# Patient Record
Sex: Female | Born: 1970 | Race: White | Hispanic: No | Marital: Married | Smoking: Current every day smoker
Health system: Southern US, Community
[De-identification: ages and names within clinical notes are randomized; demographics above are authoritative.]

## PROBLEM LIST (undated history)

## (undated) DIAGNOSIS — F419 Anxiety disorder, unspecified: Secondary | ICD-10-CM

## (undated) DIAGNOSIS — E538 Deficiency of other specified B group vitamins: Secondary | ICD-10-CM

## (undated) DIAGNOSIS — E559 Vitamin D deficiency, unspecified: Secondary | ICD-10-CM

## (undated) DIAGNOSIS — K8020 Calculus of gallbladder without cholecystitis without obstruction: Secondary | ICD-10-CM

## (undated) DIAGNOSIS — R519 Headache, unspecified: Secondary | ICD-10-CM

## (undated) DIAGNOSIS — K219 Gastro-esophageal reflux disease without esophagitis: Secondary | ICD-10-CM

## (undated) DIAGNOSIS — E7800 Pure hypercholesterolemia, unspecified: Secondary | ICD-10-CM

## (undated) DIAGNOSIS — J189 Pneumonia, unspecified organism: Secondary | ICD-10-CM

## (undated) DIAGNOSIS — R51 Headache: Secondary | ICD-10-CM

## (undated) DIAGNOSIS — R200 Anesthesia of skin: Secondary | ICD-10-CM

## (undated) HISTORY — DX: Headache: R51

## (undated) HISTORY — DX: Vitamin D deficiency, unspecified: E55.9

## (undated) HISTORY — DX: Headache, unspecified: R51.9

## (undated) HISTORY — DX: Calculus of gallbladder without cholecystitis without obstruction: K80.20

## (undated) HISTORY — DX: Deficiency of other specified B group vitamins: E53.8

## (undated) HISTORY — DX: Anesthesia of skin: R20.0

## (undated) HISTORY — DX: Anxiety disorder, unspecified: F41.9

---

## 2001-12-11 ENCOUNTER — Other Ambulatory Visit: Admission: RE | Admit: 2001-12-11 | Discharge: 2001-12-11 | Payer: Self-pay | Admitting: Family Medicine

## 2002-12-09 ENCOUNTER — Ambulatory Visit (HOSPITAL_COMMUNITY): Admission: RE | Admit: 2002-12-09 | Discharge: 2002-12-09 | Payer: Self-pay | Admitting: Family Medicine

## 2003-10-31 ENCOUNTER — Other Ambulatory Visit: Admission: RE | Admit: 2003-10-31 | Discharge: 2003-10-31 | Payer: Self-pay | Admitting: Family Medicine

## 2003-12-19 ENCOUNTER — Ambulatory Visit (HOSPITAL_COMMUNITY): Admission: RE | Admit: 2003-12-19 | Discharge: 2003-12-19 | Payer: Self-pay | Admitting: Family Medicine

## 2004-02-19 ENCOUNTER — Ambulatory Visit (HOSPITAL_COMMUNITY): Admission: RE | Admit: 2004-02-19 | Discharge: 2004-02-19 | Payer: Self-pay | Admitting: Family Medicine

## 2004-08-15 ENCOUNTER — Emergency Department (HOSPITAL_COMMUNITY): Admission: EM | Admit: 2004-08-15 | Discharge: 2004-08-15 | Payer: Self-pay | Admitting: *Deleted

## 2004-10-14 ENCOUNTER — Encounter: Admission: RE | Admit: 2004-10-14 | Discharge: 2004-10-20 | Payer: Self-pay | Admitting: Family Medicine

## 2008-11-21 ENCOUNTER — Encounter: Payer: Self-pay | Admitting: Cardiovascular Disease

## 2009-05-06 ENCOUNTER — Emergency Department (HOSPITAL_COMMUNITY): Admission: EM | Admit: 2009-05-06 | Discharge: 2009-05-06 | Payer: Self-pay | Admitting: Emergency Medicine

## 2010-04-06 LAB — BASIC METABOLIC PANEL
BUN: 12 mg/dL (ref 6–23)
CO2: 19 mEq/L (ref 19–32)
Calcium: 8.9 mg/dL (ref 8.4–10.5)
Chloride: 109 mEq/L (ref 96–112)
Creatinine, Ser: 0.79 mg/dL (ref 0.4–1.2)
GFR calc Af Amer: >60 mL/min (ref >60)
GFR calc non Af Amer: >60 mL/min (ref >60)
Glucose, Bld: 97 mg/dL (ref 70–99)
Potassium: 3.5 mEq/L (ref 3.5–5.1)
Sodium: 136 mEq/L (ref 135–145)

## 2010-04-06 LAB — DIFFERENTIAL
Basophils Absolute: 0 10*3/uL (ref 0.0–0.1)
Basophils Relative: 1 % (ref 0–1)
Eosinophils Absolute: 0.3 10*3/uL (ref 0.0–0.7)
Eosinophils Relative: 3 % (ref 0–5)
Lymphocytes Relative: 22 % (ref 12–46)
Lymphs Abs: 2.1 10*3/uL (ref 0.7–4.0)
Monocytes Absolute: 0.9 10*3/uL (ref 0.1–1.0)
Monocytes Relative: 9 % (ref 3–12)
Neutro Abs: 6.3 10*3/uL (ref 1.7–7.7)
Neutrophils Relative %: 66 % (ref 43–77)

## 2010-04-06 LAB — CBC
HCT: 41.1 % (ref 36.0–46.0)
Hemoglobin: 14.5 g/dL (ref 12.0–15.0)
MCHC: 35.2 g/dL (ref 30.0–36.0)
MCV: 87.8 fL (ref 78.0–100.0)
Platelets: 252 10*3/uL (ref 150–400)
RBC: 4.68 MIL/uL (ref 3.87–5.11)
RDW: 13.8 % (ref 11.5–15.5)
WBC: 9.6 10*3/uL (ref 4.0–10.5)

## 2010-04-06 LAB — POCT CARDIAC MARKERS
CKMB, poc: <1 ng/mL — ABNORMAL LOW (ref 1.0–8.0)
Myoglobin, poc: 52.2 ng/mL (ref 12–200)
Troponin i, poc: <0.05 ng/mL (ref 0.00–0.09)

## 2010-08-17 ENCOUNTER — Emergency Department (HOSPITAL_COMMUNITY): Payer: Self-pay

## 2010-08-17 ENCOUNTER — Encounter: Payer: Self-pay | Admitting: Emergency Medicine

## 2010-08-17 ENCOUNTER — Emergency Department (HOSPITAL_COMMUNITY)
Admission: EM | Admit: 2010-08-17 | Discharge: 2010-08-17 | Disposition: A | Discharge status: Home / Self Care | Payer: Self-pay | Attending: Emergency Medicine | Admitting: Emergency Medicine

## 2010-08-17 DIAGNOSIS — J029 Acute pharyngitis, unspecified: Secondary | ICD-10-CM | POA: Insufficient documentation

## 2010-08-17 DIAGNOSIS — R112 Nausea with vomiting, unspecified: Secondary | ICD-10-CM | POA: Insufficient documentation

## 2010-08-17 DIAGNOSIS — F17200 Nicotine dependence, unspecified, uncomplicated: Secondary | ICD-10-CM | POA: Insufficient documentation

## 2010-08-17 DIAGNOSIS — R197 Diarrhea, unspecified: Secondary | ICD-10-CM | POA: Insufficient documentation

## 2010-08-17 DIAGNOSIS — R509 Fever, unspecified: Secondary | ICD-10-CM | POA: Insufficient documentation

## 2010-08-17 LAB — COMPREHENSIVE METABOLIC PANEL
ALT: 21 U/L (ref 0–35)
AST: 19 U/L (ref 0–37)
Albumin: 3.6 g/dL (ref 3.5–5.2)
Alkaline Phosphatase: 119 U/L — ABNORMAL HIGH (ref 39–117)
BUN: 10 mg/dL (ref 6–23)
CO2: 23 mEq/L (ref 19–32)
Calcium: 9.1 mg/dL (ref 8.4–10.5)
Chloride: 104 mEq/L (ref 96–112)
Creatinine, Ser: 0.7 mg/dL (ref 0.50–1.10)
GFR calc Af Amer: >60 mL/min (ref >60)
GFR calc non Af Amer: >60 mL/min (ref >60)
Glucose, Bld: 90 mg/dL (ref 70–99)
Potassium: 4.4 mEq/L (ref 3.5–5.1)
Sodium: 138 mEq/L (ref 135–145)
Total Bilirubin: 0.4 mg/dL (ref 0.3–1.2)
Total Protein: 7.5 g/dL (ref 6.0–8.3)

## 2010-08-17 LAB — CBC
HCT: 45 % (ref 36.0–46.0)
Hemoglobin: 14.9 g/dL (ref 12.0–15.0)
MCH: 29.5 pg (ref 26.0–34.0)
MCHC: 33.1 g/dL (ref 30.0–36.0)
MCV: 89.1 fL (ref 78.0–100.0)
Platelets: 345 10*3/uL (ref 150–400)
RBC: 5.05 MIL/uL (ref 3.87–5.11)
RDW: 13.7 % (ref 11.5–15.5)
WBC: 10.4 10*3/uL (ref 4.0–10.5)

## 2010-08-17 LAB — LIPASE, BLOOD: Lipase: 17 U/L (ref 11–59)

## 2010-08-17 MED ORDER — ONDANSETRON HCL 4 MG/2ML IJ SOLN
4.0000 mg | Freq: Once | INTRAMUSCULAR | Status: AC
  Administered 2010-08-17: 4 mg via INTRAVENOUS

## 2010-08-17 MED ORDER — HYDROMORPHONE HCL 1 MG/ML IJ SOLN
1.0000 mg | Freq: Once | INTRAMUSCULAR | Status: AC
  Administered 2010-08-17: 1 mg via INTRAVENOUS

## 2010-08-17 MED ORDER — HYDROCODONE-ACETAMINOPHEN 5-500 MG PO TABS: 1.0000 | ORAL_TABLET | Freq: Four times a day (QID) | ORAL | Status: AC | PRN

## 2010-08-17 MED ORDER — ONDANSETRON HCL 4 MG/2ML IJ SOLN
4.0000 mg | Freq: Once | INTRAMUSCULAR | Status: AC
  Administered 2010-08-17: 4 mg via INTRAVENOUS
  Filled 2010-08-17: qty 2

## 2010-08-17 MED ORDER — MORPHINE SULFATE 4 MG/ML IJ SOLN
4.0000 mg | Freq: Once | INTRAMUSCULAR | Status: AC
  Administered 2010-08-17: 4 mg via INTRAVENOUS
  Filled 2010-08-17: qty 1

## 2010-08-17 MED ORDER — SODIUM CHLORIDE 0.9 % IV BOLUS (SEPSIS)
1000.0000 mL | Freq: Once | INTRAVENOUS | Status: AC
  Administered 2010-08-17: 1000 mL via INTRAVENOUS

## 2010-08-17 MED ORDER — IOHEXOL 300 MG/ML  SOLN
100.0000 mL | Freq: Once | INTRAMUSCULAR | Status: AC | PRN
  Administered 2010-08-17: 100 mL via INTRAVENOUS

## 2010-08-17 MED ORDER — PROMETHAZINE HCL 25 MG PO TABS: 25.0000 mg | ORAL_TABLET | Freq: Four times a day (QID) | ORAL | Status: DC | PRN

## 2010-08-17 MED ORDER — SODIUM CHLORIDE 0.9 % IV SOLN: Freq: Once | INTRAVENOUS | Status: DC

## 2010-08-17 NOTE — ED Notes (Signed)
Patient c/o nausea. Pain reevaluated-patient rates pain at a 6 out of 10. EDP made aware, in room to reassess patient. Verbal orders given.

## 2010-08-17 NOTE — ED Notes (Signed)
Pt c/o sore throat, fever, n/v/d x 2 days.

## 2010-08-17 NOTE — ED Provider Notes (Signed)
History    Scribed for Sunnie Nielsen, MD, the patient was seen in room APA06/APA06. This chart was scribed by Clarita Crane. This patient's care was started at 12:33PM.  Chief Complaint  Patient presents with  . Emesis  . Fever  . Diarrhea  . Sore Throat   Patient is a 40 y.o. female presenting with pharyngitis and diarrhea. The history is provided by the patient. No language interpreter was used.  Sore Throat This is a new problem. The current episode started 2 days ago. The problem occurs constantly. The problem has not changed since onset.Associated symptoms include abdominal pain. Pertinent negatives include no chest pain, no headaches and no shortness of breath. The symptoms are aggravated by nothing. The symptoms are relieved by nothing. She has tried nothing for the symptoms.  Diarrhea The primary symptoms include fever, abdominal pain, nausea, vomiting and diarrhea. Primary symptoms do not include melena, hematemesis, hematochezia, dysuria or rash. The illness began 2 days ago. The onset was sudden. The problem has not changed since onset. The abdominal pain began 2 days ago (intermittent). The abdominal pain is generalized. The abdominal pain does not radiate. The abdominal pain is relieved by nothing.  The diarrhea is watery.  The illness does not include chills or back pain. Associated medical issues comments: History of abdominal surgery- C-section.  Patient is a 40 year old female c/o constant sore throat, nausea, vomiting and diarrhea onset 2 nights ago and persistent since with associated fever and intermittent abdominal pain. Denies rashes, tick bites, hematemesis, blood in stool, recent abx use, recent travel, recent sick contacts. Describes diarrhea as watery. Reports sore throat and n/v/d not aggravated or relieved by anything. No other complaints.  PAST MEDICAL HISTORY:  History reviewed. No pertinent past medical history.  PAST SURGICAL HISTORY:  Past Surgical History    Procedure Date  . Cesarean section     MEDICATIONS:  Previous Medications   IBUPROFEN (ADVIL,MOTRIN) 200 MG TABLET    Take 600 mg by mouth every 6 (six) hours as needed. OTC    RANITIDINE (ZANTAC) 150 MG TABLET    Take 150 mg by mouth daily. OTC       ALLERGIES:  Allergies as of 08/17/2010  . (No Known Allergies)     FAMILY HISTORY:  History reviewed. No pertinent family history.   SOCIAL HISTORY: History   Social History  . Marital Status: Married    Spouse Name: N/A    Number of Children: N/A  . Years of Education: N/A   Social History Main Topics  . Smoking status: Current Everyday Smoker -- 0.5 packs/day    Types: Cigarettes  . Smokeless tobacco: None  . Alcohol Use: No  . Drug Use: No  . Sexually Active:    Other Topics Concern  . None   Social History Narrative  . None     Review of Systems  Constitutional: Positive for fever. Negative for chills.  HENT: Positive for sore throat. Negative for neck pain and neck stiffness.   Eyes: Negative for pain.  Respiratory: Negative for shortness of breath.   Cardiovascular: Negative for chest pain.  Gastrointestinal: Positive for nausea, vomiting, abdominal pain and diarrhea. Negative for melena, hematochezia and hematemesis.  Genitourinary: Negative for dysuria.  Musculoskeletal: Negative for back pain.  Skin: Negative for rash.  Neurological: Negative for headaches.  All other systems reviewed and are negative.   Physical Exam  BP 127/80  Pulse 89  Temp(Src) 98.5 F (36.9 C) (Oral)  Resp 20  Ht 5\' 6"  (1.676 m)  Wt 202 lb 5 oz (91.768 kg)  BMI 32.65 kg/m2  SpO2 99%  LMP 07/19/2010  Physical Exam  Nursing note and vitals reviewed. Constitutional: She is oriented to person, place, and time. She appears well-developed and well-nourished. No distress.  HENT:  Head: Normocephalic and atraumatic.  Mouth/Throat: Uvula is midline. Mucous membranes are dry.       Trachea midline  Eyes: Conjunctivae are  normal. Pupils are equal, round, and reactive to light.  Neck: Normal range of motion. Neck supple. No tracheal deviation present.  Cardiovascular: Normal rate and regular rhythm.   No murmur heard. Pulmonary/Chest: Effort normal and breath sounds normal. She has no wheezes.  Abdominal: Soft. Bowel sounds are normal. She exhibits no distension. There is tenderness (mild) in the right lower quadrant. There is no rebound, no guarding and negative Murphy's sign.  Musculoskeletal: Normal range of motion. She exhibits no edema.  Lymphadenopathy:    She has no cervical adenopathy.  Neurological: She is alert and oriented to person, place, and time. No sensory deficit.  Skin: Skin is warm and dry.  Psychiatric: She has a normal mood and affect. Her behavior is normal.    ED Course  Procedures  OTHER DATA REVIEWED: Nursing notes, vital signs, and past medical records reviewed. Lab results reviewed and evaluated   DIAGNOSTIC STUDIES:   Oxygen saturation is 99% on room air, normal by my interpretation.    LABS / RADIOLOGY: Results for orders placed during the hospital encounter of 08/17/10  COMPREHENSIVE METABOLIC PANEL      Component Value Range   Sodium 138  135 - 145 (mEq/L)   Potassium 4.4  3.5 - 5.1 (mEq/L)   Chloride 104  96 - 112 (mEq/L)   CO2 23  19 - 32 (mEq/L)   Glucose, Bld 90  70 - 99 (mg/dL)   BUN 10  6 - 23 (mg/dL)   Creatinine, Ser 9.52  0.50 - 1.10 (mg/dL)   Calcium 9.1  8.4 - 84.1 (mg/dL)   Total Protein 7.5  6.0 - 8.3 (g/dL)   Albumin 3.6  3.5 - 5.2 (g/dL)   AST 19  0 - 37 (U/L)   ALT 21  0 - 35 (U/L)   Alkaline Phosphatase 119 (*) 39 - 117 (U/L)   Total Bilirubin 0.4  0.3 - 1.2 (mg/dL)   GFR calc non Af Amer >60  >60 (mL/min)   GFR calc Af Amer >60  >60 (mL/min)  LIPASE, BLOOD      Component Value Range   Lipase 17  11 - 59 (U/L)    ED COURSE / COORDINATION OF CARE: 1:58PM- Physician informed by nurse that patient c/o abdominal pain currently and requests  medication. Order for Morphine-4mg  placed.  2:43PM- Patient c/o right sided abdominal "discomfort". Physician recommends obtaining abdominal scan. Patient consents to have CT-abdomen performed.  CT reviewed pain improved, plan d/c home close follow up.   MDM: Differential Diagnosis: gastroenteritis, colitis, kidney stone, pyelo Exam not c/w appendicitis.   PLAN: Discharge  The patient is to return the emergency department if there is any worsening of symptoms. I have reviewed the discharge instructions with the patient/family   CONDITION ON DISCHARGE: improved   MEDICATIONS GIVEN IN THE E.D.  Medications  ibuprofen (ADVIL,MOTRIN) 200 MG tablet (not administered)  ranitidine (ZANTAC) 150 MG tablet (not administered)  0.9 %  sodium chloride infusion (not administered)  sodium chloride 0.9 % bolus 1,000 mL (  1000 mL Intravenous Given 08/17/10 1230)  ondansetron (ZOFRAN) injection 4 mg (4 mg Intravenous Given 08/17/10 1254)  morphine injection 4 mg (4 mg Intravenous Given 08/17/10 1402)  ondansetron (ZOFRAN) injection 4 mg (4 mg Intravenous Given 08/17/10 1447)  HYDROmorphone (DILAUDID) injection 1 mg (1 mg Intravenous Given 08/17/10 1453)  iohexol (OMNIPAQUE) 300 MG/ML injection 100 mL (100 mL Intravenous Contrast Given 08/17/10 1545)     DISCHARGE MEDICATIONS: New Prescriptions   No medications on file       I personally performed the services described in this documentation, which was scribed in my presence. The recorded information has been reviewed and considered. Sunnie Nielsen, MD     Sunnie Nielsen, MD 08/17/10 [id number]

## 2010-08-17 NOTE — ED Notes (Signed)
Patient still unable to give stool specimen at this time. Patient c/o abd pain requesting something for the pain. EDP made aware, verbal order givne.

## 2010-08-17 NOTE — ED Notes (Signed)
Pt assisted to bathroom. No acute distress noted. Airway patent. Gait steady.

## 2010-08-19 ENCOUNTER — Encounter (HOSPITAL_COMMUNITY): Payer: Self-pay | Admitting: *Deleted

## 2010-08-19 ENCOUNTER — Emergency Department (HOSPITAL_COMMUNITY): Payer: Self-pay

## 2010-08-19 ENCOUNTER — Emergency Department (HOSPITAL_COMMUNITY)
Admission: EM | Admit: 2010-08-19 | Discharge: 2010-08-19 | Disposition: A | Discharge status: Home / Self Care | Payer: Self-pay | Attending: Emergency Medicine | Admitting: Emergency Medicine

## 2010-08-19 DIAGNOSIS — R1084 Generalized abdominal pain: Secondary | ICD-10-CM | POA: Insufficient documentation

## 2010-08-19 DIAGNOSIS — R197 Diarrhea, unspecified: Secondary | ICD-10-CM | POA: Insufficient documentation

## 2010-08-19 DIAGNOSIS — N949 Unspecified condition associated with female genital organs and menstrual cycle: Secondary | ICD-10-CM | POA: Insufficient documentation

## 2010-08-19 DIAGNOSIS — R112 Nausea with vomiting, unspecified: Secondary | ICD-10-CM | POA: Insufficient documentation

## 2010-08-19 DIAGNOSIS — F17200 Nicotine dependence, unspecified, uncomplicated: Secondary | ICD-10-CM | POA: Insufficient documentation

## 2010-08-19 DIAGNOSIS — R509 Fever, unspecified: Secondary | ICD-10-CM | POA: Insufficient documentation

## 2010-08-19 DIAGNOSIS — M545 Low back pain, unspecified: Secondary | ICD-10-CM | POA: Insufficient documentation

## 2010-08-19 LAB — URINALYSIS, ROUTINE W REFLEX MICROSCOPIC
Glucose, UA: NEGATIVE mg/dL
Leukocytes, UA: NEGATIVE
Nitrite: NEGATIVE
Specific Gravity, Urine: 1.015 (ref 1.005–1.030)
pH: 7 (ref 5.0–8.0)

## 2010-08-19 LAB — WET PREP, GENITAL
Trich, Wet Prep: NONE SEEN
Yeast Wet Prep HPF POC: NONE SEEN

## 2010-08-19 MED ORDER — ONDANSETRON HCL 4 MG PO TABS: 4.0000 mg | ORAL_TABLET | Freq: Four times a day (QID) | ORAL | Status: AC

## 2010-08-19 MED ORDER — IBUPROFEN 800 MG PO TABS
800.0000 mg | ORAL_TABLET | Freq: Once | ORAL | Status: AC
  Administered 2010-08-19: 800 mg via ORAL
  Filled 2010-08-19: qty 1

## 2010-08-19 MED ORDER — ONDANSETRON 8 MG PO TBDP
8.0000 mg | ORAL_TABLET | Freq: Once | ORAL | Status: AC
  Administered 2010-08-19: 8 mg via ORAL
  Filled 2010-08-19: qty 1

## 2010-08-19 MED ORDER — OXYCODONE-ACETAMINOPHEN 5-325 MG PO TABS: 1.0000 | ORAL_TABLET | Freq: Once | ORAL | Status: DC

## 2010-08-19 MED ORDER — OXYCODONE-ACETAMINOPHEN 5-325 MG PO TABS
2.0000 | ORAL_TABLET | Freq: Once | ORAL | Status: AC
  Administered 2010-08-19: 2 via ORAL
  Filled 2010-08-19: qty 2

## 2010-08-19 NOTE — ED Provider Notes (Signed)
History    Scribed for Kathy Gaskins, MD, the patient was seen in room APA08/APA08. This chart was scribed by Clarita Crane. This patient's care was started at 4:50PM.  CSN: [id number] Arrival date & time: 08/19/2010  4:48 PM  Chief Complaint  Patient presents with  . Flank Pain   HPI Patient is a 40 year old female c/o constant right sided flank pain and right sided abdominal onset 4 days ago and persistent since with associated fever, nausea, vomiting, diarrhea and low back pain. Patient reports diarrhea has improved over the past 2 days although still persistent. Denies blood in stool, hematemesis, dysuria, chest pain, SOB, cough, vaginal bleeding, vaginal discharge. Patient notes pain is aggravated with palpation. Patient also indicates she was evaluated in ED 2 days ago for same complaint, treated with pain and antiemetics and discharged home with diagnosis of gastritis. Patient notes condition improved with use of pain and antiemetic medications but returned upon arrival at home and has been persistent since. Notes h/o c-section but no other known medical issues. Patient is a current everyday smoker.   PAST MEDICAL HISTORY:  History reviewed. No pertinent past medical history.  PAST SURGICAL HISTORY:  Past Surgical History  Procedure Date  . Cesarean section     MEDICATIONS:  Previous Medications   HYDROCODONE-ACETAMINOPHEN (VICODIN) 5-500 MG PER TABLET    Take 1-2 tablets by mouth every 6 (six) hours as needed for pain.   IBUPROFEN (ADVIL,MOTRIN) 200 MG TABLET    Take 600 mg by mouth every 6 (six) hours as needed. OTC    PROMETHAZINE (PHENERGAN) 25 MG TABLET    Take 1 tablet (25 mg total) by mouth every 6 (six) hours as needed for nausea.   RANITIDINE (ZANTAC) 150 MG TABLET    Take 150 mg by mouth daily. OTC       ALLERGIES:  Allergies as of 08/19/2010  . (No Known Allergies)     FAMILY HISTORY:  History reviewed. No pertinent family history.   SOCIAL HISTORY: History     Social History  . Marital Status: Married    Spouse Name: N/A    Number of Children: N/A  . Years of Education: N/A   Social History Main Topics  . Smoking status: Current Everyday Smoker -- 0.5 packs/day    Types: Cigarettes  . Smokeless tobacco: None  . Alcohol Use: No  . Drug Use: No  . Sexually Active:    Other Topics Concern  . None   Social History Narrative  . None     OB History    Grav Para Term Preterm Abortions TAB SAB Ect Mult Living                  Review of Systems 10 Systems reviewed and are negative for acute change except as noted in the HPI.  Physical Exam  BP 124/74  Pulse 90  Temp(Src) 98.5 F (36.9 C) (Oral)  Resp 20  Ht 5\' 6"  (1.676 m)  Wt 202 lb (91.627 kg)  BMI 32.60 kg/m2  SpO2 100%  LMP 07/19/2010  Physical Exam CONSTITUTIONAL: Well developed/well nourished HEAD AND FACE: Normocephalic/atraumatic EYES: EOMI/PERRL ENMT: Mucous membranes moist NECK: supple no meningeal signs SPINE: tenderness to palpation of right lower back but no CVA tenderness CV: S1/S2 noted, no murmurs/rubs/gallops noted LUNGS: Lungs are clear to auscultation bilaterally, no apparent distress ABDOMEN: soft, mild RLQ tenderness, no rebound or guarding GU:no cva tenderness, Pelvic exam performed with female chaperone present- no  CMT, no adnexal tenderness or mass, no vaginal bleeding or discharge noted NEURO: Pt is awake/alert, moves all extremitiesx4 EXTREMITIES: pulses normal, full ROM, DP and PT pulses normal SKIN: warm, color normal PSYCH: no abnormalities of mood noted  ED Course  Procedures    OTHER DATA REVIEWED: Nursing notes, vital signs, and past medical records reviewed. Previous medical records reviewed and evaluated Recent past ED visits, labs and radiology reviewed and considered  Patient was evaluated in ED on 08/17/2010 for sore throat and diarrhea with associated intermittent abdominal pain by Dr. Dierdre Highman. Patient had CMP (with slightly  elevated Alkaline Phosphatase but otherwise normal) and Lipase (normal) performed. Patient was d/c home.  Ct Abdomen Pelvis W Contrast  08/17/2010  *RADIOLOGY REPORT*  Clinical Data: Abdominal pain, fever, nausea, vomiting  CT ABDOMEN AND PELVIS WITH CONTRAST  Technique:  Multidetector CT imaging of the abdomen and pelvis was performed following the standard protocol during bolus administration of intravenous contrast.  Contrast: 100 ml Omnipaque-300  Comparison: 02/19/2004 pelvic ultrasound the  Findings: Lung bases clear.  Normal heart size.  No pericardial or pleural effusion.  Negative for hiatal hernia.  Abdomen:  Scattered tiny cystic hypodense lesions in the liver throughout suspicious for small hepatic cysts.  No biliary dilatation.  Hepatic and portal veins are patent.  Gallbladder, biliary system, pancreas, spleen, and kidneys are within normal limits and demonstrate no acute process.  No bowel obstruction pattern, dilatation, ileus, or free air.  Negative for free fluid, ascites, fluid collection, abscess, hemorrhage or hematoma.  No adenopathy.  Pelvis:  Distal small bowel and appendix are normal.  No distal bowel process.  No significant pelvic free fluid, fluid collection, hemorrhage, hematoma, adenopathy, or inguinal abnormality.  No hernia.  Normal urinary bladder.  Uterus is normal size and midline.  Ovaries are normal in size.  Right ovary demonstrates a peripherally enhancing hypodense cystic lesion measuring 2 cm, image 63 suspect small ovarian collapsing cyst or follicle.  Slight degenerative changes at L5-S1 .  Normal alignment.  IMPRESSION: No acute intra-abdominal or pelvic finding.  Probable small hepatic cysts.  2 cm peripherally enhancing right ovarian cystic lesion, suspect collapsing follicle or cyst.  No significant pelvic free fluid.  Normal appendix.  Original Report Authenticated By: Judie Petit. Ruel Favors, M.D.    DIAGNOSTIC STUDIES:    LABS / RADIOLOGY: Results for orders  placed during the hospital encounter of 08/19/10  URINALYSIS, ROUTINE W REFLEX MICROSCOPIC      Component Value Range   Color, Urine YELLOW  YELLOW    Appearance CLEAR  CLEAR    Specific Gravity, Urine 1.015  1.005 - 1.030    pH 7.0  5.0 - 8.0    Glucose, UA NEGATIVE  NEGATIVE (mg/dL)   Hgb urine dipstick NEGATIVE  NEGATIVE    Bilirubin Urine NEGATIVE  NEGATIVE    Ketones, ur NEGATIVE  NEGATIVE (mg/dL)   Protein, ur NEGATIVE  NEGATIVE (mg/dL)   Urobilinogen, UA 0.2  0.0 - 1.0 (mg/dL)   Nitrite NEGATIVE  NEGATIVE    Leukocytes, UA NEGATIVE  NEGATIVE   WET PREP, GENITAL      Component Value Range   Yeast, Wet Prep NONE SEEN  NONE SEEN    Trich, Wet Prep NONE SEEN  NONE SEEN    Clue Cells, Wet Prep NONE SEEN  NONE SEEN    WBC, Wet Prep HPF POC FEW (*) NONE SEEN   PREGNANCY, URINE      Component Value Range  Preg Test, Ur NEGATIVE      US Transvaginal Non-ob  08/19/2010  *RADIOLOGY REPORT*  Clinical Data:  Right pelvic pain.  Clinical suspicion for ovarian torsion.  TRANSABDOMINAL AND TRANSVAGINAL ULTRASOUND OF PELVIS DOPPLER ULTRASOUND OF OVARIES  Technique:  Both transabdominal and transvaginal ultrasound examinations of the pelvis were performed. Transabdominal technique was performed for global imaging of the pelvis including uterus, ovaries, adnexal regions, and pelvic cul-de-sac.  It was necessary to proceed with endovaginal exam following the transabdominal exam to visualize the endometrium and ovaries.  Color and duplex Doppler ultrasound was utilized to evaluate blood flow to the ovaries.  Comparison:  None.  Findings:  Uterus:  Normal in size and appearance  Endometrium:  15 mm double layer thickness measured transvaginally. Homogeneous hyperechoic appearance.  Right ovary: Normal appearance/no adnexal mass  Left ovary:   Normal appearance/no adnexal mass  Pulsed Doppler evaluation demonstrates normal low-resistance arterial and venous waveforms in both ovaries.  Other:  A tiny  amount of free fluid noted.  IMPRESSION: No evidence of pelvic mass or other significant abnormality.  No sonographic evidence for ovarian torsion.  Original Report Authenticated By: Danae Orleans, M.D.   US Pelvis Complete  08/19/2010  *RADIOLOGY REPORT*  Clinical Data:  Right pelvic pain.  Clinical suspicion for ovarian torsion.  TRANSABDOMINAL AND TRANSVAGINAL ULTRASOUND OF PELVIS DOPPLER ULTRASOUND OF OVARIES  Technique:  Both transabdominal and transvaginal ultrasound examinations of the pelvis were performed. Transabdominal technique was performed for global imaging of the pelvis including uterus, ovaries, adnexal regions, and pelvic cul-de-sac.  It was necessary to proceed with endovaginal exam following the transabdominal exam to visualize the endometrium and ovaries.  Color and duplex Doppler ultrasound was utilized to evaluate blood flow to the ovaries.  Comparison:  None.  Findings:  Uterus:  Normal in size and appearance  Endometrium:  15 mm double layer thickness measured transvaginally. Homogeneous hyperechoic appearance.  Right ovary: Normal appearance/no adnexal mass  Left ovary:   Normal appearance/no adnexal mass  Pulsed Doppler evaluation demonstrates normal low-resistance arterial and venous waveforms in both ovaries.  Other:  A tiny amount of free fluid noted.  IMPRESSION: No evidence of pelvic mass or other significant abnormality.  No sonographic evidence for ovarian torsion.  Original Report Authenticated By: Danae Orleans, M.D.   [place] Art/ven Flow Abd Pelv Doppler  08/19/2010  *RADIOLOGY REPORT*  Clinical Data:  Right pelvic pain.  Clinical suspicion for ovarian torsion.  TRANSABDOMINAL AND TRANSVAGINAL ULTRASOUND OF PELVIS DOPPLER ULTRASOUND OF OVARIES  Technique:  Both transabdominal and transvaginal ultrasound examinations of the pelvis were performed. Transabdominal technique was performed for global imaging of the pelvis including uterus, ovaries, adnexal regions, and pelvic  cul-de-sac.  It was necessary to proceed with endovaginal exam following the transabdominal exam to visualize the endometrium and ovaries.  Color and duplex Doppler ultrasound was utilized to evaluate blood flow to the ovaries.  Comparison:  None.  Findings:  Uterus:  Normal in size and appearance  Endometrium:  15 mm double layer thickness measured transvaginally. Homogeneous hyperechoic appearance.  Right ovary: Normal appearance/no adnexal mass  Left ovary:   Normal appearance/no adnexal mass  Pulsed Doppler evaluation demonstrates normal low-resistance arterial and venous waveforms in both ovaries.  Other:  A tiny amount of free fluid noted.  IMPRESSION: No evidence of pelvic mass or other significant abnormality.  No sonographic evidence for ovarian torsion.  Original Report Authenticated By: Danae Orleans, M.D.     PROCEDURES:  ED COURSE / COORDINATION OF CARE: 4:55PM- Explained current plan of action and agrees to plan set forth at this time. Patient consents to have pelvic exam performed. 5:57PM- Patient informed of current lab results. 6:17PM-Pelvic exam performed. Female chaperone present 7:03PM- Patient informed of lab and imaging results and intent to d/c home. Patient agrees with plan set at this time.   MDM: Pt with recent CT imaging that showed normal appendix, and time course/history make acute appy less likely.  Gyn exam unremarkable, stable for d/c Suspicion for acute abd process is low at this time.  Needs outpatient followup.      PLAN:  Discharge The patient is to return the emergency department if there is any worsening of symptoms. I have reviewed the discharge instructions with the patient/family   CONDITION ON DISCHARGE: Stable   MEDICATIONS GIVEN IN THE E.D.  Medications  oxyCODONE-acetaminophen (PERCOCET) 5-325 MG per tablet 2 tablet (2 tablet Oral Given 08/19/10 1746)  ondansetron (ZOFRAN-ODT) disintegrating tablet 8 mg (8 mg Oral Given 08/19/10 1746)      I  personally performed the services described in this documentation, which was scribed in my presence. The recorded information has been reviewed and considered. Kathy Gaskins, MD    Kathy Gaskins, MD 08/19/10 [phone]

## 2010-08-19 NOTE — ED Notes (Signed)
Pt c/o rt flank pain, nausea, vomiting and diarrhea since Sunday night. Pt was seen in ED on 08/17/10 and told that she has gastritis but states that she is not feeling any better.

## 2010-08-20 LAB — GC/CHLAMYDIA PROBE AMP, GENITAL: GC Probe Amp, Genital: NEGATIVE

## 2013-01-18 ENCOUNTER — Encounter: Payer: Self-pay | Admitting: Cardiovascular Disease

## 2013-01-18 ENCOUNTER — Telehealth: Payer: Self-pay | Admitting: Nurse Practitioner

## 2013-01-18 NOTE — Telephone Encounter (Signed)
Pain in right rib cage for 3 days. Denies cough. Patient is unable to pay balance today and is seeking care at Urgent Care.

## 2013-03-27 ENCOUNTER — Ambulatory Visit (INDEPENDENT_AMBULATORY_CARE_PROVIDER_SITE_OTHER): Payer: 59 | Admitting: Family Medicine

## 2013-03-27 ENCOUNTER — Encounter: Payer: Self-pay | Admitting: Family Medicine

## 2013-03-27 VITALS — BP 124/82 | HR 70 | Temp 98.0°F | Ht 65.75 in | Wt 175.0 lb

## 2013-03-27 DIAGNOSIS — R059 Cough, unspecified: Secondary | ICD-10-CM

## 2013-03-27 DIAGNOSIS — R05 Cough: Secondary | ICD-10-CM

## 2013-03-27 DIAGNOSIS — J069 Acute upper respiratory infection, unspecified: Secondary | ICD-10-CM

## 2013-03-27 DIAGNOSIS — R52 Pain, unspecified: Secondary | ICD-10-CM

## 2013-03-27 DIAGNOSIS — R509 Fever, unspecified: Secondary | ICD-10-CM

## 2013-03-27 DIAGNOSIS — J029 Acute pharyngitis, unspecified: Secondary | ICD-10-CM

## 2013-03-27 LAB — POCT RAPID STREP A (OFFICE): Rapid Strep A Screen: NEGATIVE

## 2013-03-27 LAB — POCT INFLUENZA A/B
Influenza A, POC: NEGATIVE
Influenza B, POC: NEGATIVE

## 2013-03-27 MED ORDER — AZITHROMYCIN 250 MG PO TABS: ORAL_TABLET | ORAL | Status: DC

## 2013-03-27 NOTE — Patient Instructions (Signed)

## 2013-03-27 NOTE — Progress Notes (Signed)
   Subjective:    Patient ID: Regina Eck, female    DOB: 04-16-1970, 43 y.o.   MRN: [id number]  HPI This 43 y.o. female presents for evaluation of URI sx's and sore throat.  She has  Been feeling tired and she had fever of 102 degrees last night.   Review of Systems C/o uri sx's No chest pain, SOB, HA, dizziness, vision change, N/V, diarrhea, constipation, dysuria, urinary urgency or frequency, myalgias, arthralgias or rash.     Objective:   Physical Exam Vital signs noted  Well developed well nourished female.  HEENT - Head atraumatic Normocephalic                Eyes - PERRLA, Conjuctiva - clear Sclera- Clear EOMI                Ears - EAC's Wnl TM's Wnl Gross Hearing WNL                Nose - Nares patent                 Throat - oropharanx wnl Respiratory - Lungs CTA bilateral Cardiac - RRR S1 and S2 without murmur GI - Abdomen soft Nontender and bowel sounds active x 4 Extremities - No edema. Neuro - Grossly intact.       Assessment & Plan:  Sore throat - Plan: POCT rapid strep A, azithromycin (ZITHROMAX) 250 MG tablet  Cough - Plan: POCT Influenza A/B, azithromycin (ZITHROMAX) 250 MG tablet  Body aches - Plan: POCT Influenza A/B, azithromycin (ZITHROMAX) 250 MG tablet  Fever - Plan: POCT Influenza A/B, POCT rapid strep A, azithromycin (ZITHROMAX) 250 MG tablet  URI (upper respiratory infection) - Plan: azithromycin (ZITHROMAX) 250 MG tablet  Push po fluids, rest, tylenol and motrin otc prn as directed for fever, arthralgias, and myalgias.  Follow up prn if sx's continue or persist.  Lysbeth Penner FNP

## 2013-03-28 ENCOUNTER — Telehealth: Payer: Self-pay | Admitting: Family Medicine

## 2013-03-28 NOTE — Telephone Encounter (Signed)
Note up front for patient to pick up.

## 2013-03-28 NOTE — Telephone Encounter (Signed)
Work note faxed. Patient aware.

## 2013-05-07 ENCOUNTER — Telehealth: Payer: Self-pay | Admitting: Nurse Practitioner

## 2013-05-08 NOTE — Telephone Encounter (Signed)
appt 2:30 on 4/23 with mmm

## 2013-05-09 ENCOUNTER — Ambulatory Visit (INDEPENDENT_AMBULATORY_CARE_PROVIDER_SITE_OTHER): Payer: 59 | Admitting: Nurse Practitioner

## 2013-05-09 ENCOUNTER — Encounter: Payer: Self-pay | Admitting: Nurse Practitioner

## 2013-05-09 VITALS — BP 115/82 | HR 63 | Temp 98.7°F | Wt 180.8 lb

## 2013-05-09 DIAGNOSIS — N760 Acute vaginitis: Secondary | ICD-10-CM

## 2013-05-09 DIAGNOSIS — N898 Other specified noninflammatory disorders of vagina: Secondary | ICD-10-CM

## 2013-05-09 DIAGNOSIS — A499 Bacterial infection, unspecified: Secondary | ICD-10-CM

## 2013-05-09 DIAGNOSIS — B9689 Other specified bacterial agents as the cause of diseases classified elsewhere: Secondary | ICD-10-CM

## 2013-05-09 LAB — POCT WET PREP WITH KOH
KOH Prep POC: POSITIVE
Trichomonas, UA: NEGATIVE

## 2013-05-09 MED ORDER — METRONIDAZOLE 500 MG PO TABS: 500.0000 mg | ORAL_TABLET | Freq: Two times a day (BID) | ORAL | Status: DC

## 2013-05-09 NOTE — Patient Instructions (Signed)

## 2013-05-09 NOTE — Progress Notes (Signed)
   Subjective:    Patient ID: Kathy Ewing, female    DOB: Mar 17, 1970, 43 y.o.   MRN: [id number]  HPI Patient in c/o of vaginal discharge- started 2 weeks ago- now it has an odor. No vaginal burning or itching. No new sexual partner.    Review of Systems  Constitutional: Negative.   HENT: Negative.   Respiratory: Negative.   Cardiovascular: Negative.   Genitourinary: Positive for vaginal discharge. Negative for dysuria and frequency.  Neurological: Negative.   Psychiatric/Behavioral: Negative.   All other systems reviewed and are negative.      Objective:   Physical Exam  Constitutional: She is oriented to person, place, and time. She appears well-developed and well-nourished.  Cardiovascular: Normal rate, regular rhythm and normal heart sounds.   Pulmonary/Chest: Effort normal and breath sounds normal.  Genitourinary:  Self wet prep   Neurological: She is alert and oriented to person, place, and time.  Skin: Skin is warm and dry.  Psychiatric: She has a normal mood and affect. Her behavior is normal. Judgment and thought content normal.   Results for orders placed in visit on 05/09/13  POCT WET PREP WITH KOH      Result Value Ref Range   Trichomonas, UA Negative     Clue Cells Wet Prep HPF POC MOD     Epithelial Wet Prep HPF POC OCC     Yeast Wet Prep HPF POC OCC     Bacteria Wet Prep HPF POC MOD     RBC Wet Prep HPF POC RARE     WBC Wet Prep HPF POC 5-7     KOH Prep POC Positive     BP 115/82  Pulse 63  Temp(Src) 98.7 F (37.1 C) (Oral)  Wt 180 lb 12.8 oz (82.01 kg)     Assessment & Plan:   1. Vaginal discharge   2. Bacterial vaginosis    Meds ordered this encounter  Medications  . metroNIDAZOLE (FLAGYL) 500 MG tablet    Sig: Take 1 tablet (500 mg total) by mouth 2 (two) times daily.    Dispense:  14 tablet    Refill:  0    Order Specific Question:  Supervising Provider    Answer:  Chipper Herb [[id number]]   No douching Follow up  prn  Mary-Margaret Hassell Done, FNP

## 2013-05-30 ENCOUNTER — Telehealth: Payer: Self-pay | Admitting: Nurse Practitioner

## 2013-05-31 NOTE — Telephone Encounter (Signed)
Only other option is a cream u put in vagina- how do know not working NtBS if still has discharge

## 2013-06-01 NOTE — Telephone Encounter (Signed)
appt set up for pt

## 2013-06-07 ENCOUNTER — Ambulatory Visit: Payer: 59 | Admitting: Nurse Practitioner

## 2013-06-07 ENCOUNTER — Telehealth: Payer: Self-pay | Admitting: Nurse Practitioner

## 2013-06-07 NOTE — Telephone Encounter (Signed)
Was told needs to be seen to look at sample of discharge.

## 2013-06-11 NOTE — Telephone Encounter (Signed)
Patient aware ntbs 

## 2013-09-18 ENCOUNTER — Ambulatory Visit (INDEPENDENT_AMBULATORY_CARE_PROVIDER_SITE_OTHER): Payer: 59 | Admitting: Family Medicine

## 2013-09-18 VITALS — BP 117/84 | HR 64 | Temp 98.3°F | Ht 65.75 in | Wt 183.0 lb

## 2013-09-18 DIAGNOSIS — G43111 Migraine with aura, intractable, with status migrainosus: Secondary | ICD-10-CM

## 2013-09-18 MED ORDER — BUPROPION HCL ER (XL) 150 MG PO TB24: 150.0000 mg | ORAL_TABLET | Freq: Every day | ORAL | Status: DC

## 2013-09-18 MED ORDER — SUMATRIPTAN SUCCINATE 100 MG PO TABS: 100.0000 mg | ORAL_TABLET | ORAL | Status: DC | PRN

## 2013-09-18 MED ORDER — METHYLPREDNISOLONE (PAK) 4 MG PO TABS: ORAL_TABLET | ORAL | Status: DC

## 2013-09-18 MED ORDER — BUTALBITAL-APAP-CAFFEINE 50-325-40 MG PO TABS: 1.0000 | ORAL_TABLET | Freq: Four times a day (QID) | ORAL | Status: AC | PRN

## 2013-09-18 NOTE — Progress Notes (Signed)
   Subjective:    Patient ID: Kathy Ewing, female    DOB: 07-28-1970, 43 y.o.   MRN: [id number]  HPI This 43 y.o. female presents for evaluation of migraine headache.  She states she is having migraine and it started out with stress headache and has turned into migraine.  She is a smoker and wants to quit.   Review of Systems C/o headache No chest pain, SOB, HA, dizziness, vision change, N/V, diarrhea, constipation, dysuria, urinary urgency or frequency, myalgias, arthralgias or rash.     Objective:   Physical Exam Vital signs noted  Well developed well nourished female.  HEENT - Head atraumatic Normocephalic                Eyes - PERRLA, Conjuctiva - clear Sclera- Clear EOMI                Ears - EAC's Wnl TM's Wnl Gross Hearing WNL                Throat - oropharanx wnl Respiratory - Lungs CTA bilateral Cardiac - RRR S1 and S2 without murmur GI - Abdomen soft Nontender and bowel sounds active x 4 Extremities - No edema. Neuro - Grossly intact.       Assessment & Plan:  Intractable migraine with aura with status migrainosus - Plan: methylPREDNIsolone (MEDROL DOSPACK) 4 MG tablet, butalbital-acetaminophen-caffeine (FIORICET) 50-325-40 MG per tablet, SUMAtriptan (IMITREX) 100 MG tablet  Tobacco abuse - Wellbutrin xl 150mg  one po qd #30w/11 rf  Follow up prn.  Lysbeth Penner FNP

## 2013-10-10 ENCOUNTER — Encounter: Payer: Self-pay | Admitting: Family Medicine

## 2013-10-10 ENCOUNTER — Telehealth: Payer: Self-pay | Admitting: Family Medicine

## 2013-10-10 ENCOUNTER — Other Ambulatory Visit: Payer: Self-pay | Admitting: Family Medicine

## 2013-10-10 ENCOUNTER — Ambulatory Visit (INDEPENDENT_AMBULATORY_CARE_PROVIDER_SITE_OTHER): Payer: 59 | Admitting: Family Medicine

## 2013-10-10 VITALS — BP 115/78 | HR 92 | Temp 97.1°F | Ht 65.75 in | Wt 182.4 lb

## 2013-10-10 DIAGNOSIS — F4321 Adjustment disorder with depressed mood: Secondary | ICD-10-CM

## 2013-10-10 MED ORDER — ALPRAZOLAM 0.5 MG PO TABS: 0.5000 mg | ORAL_TABLET | Freq: Three times a day (TID) | ORAL | Status: DC | PRN

## 2013-10-10 NOTE — Progress Notes (Signed)
   Subjective:    Patient ID: LORANN TANI, female    DOB: 03-11-1970, 43 y.o.   MRN: [id number]  HPI C/o grief and panic.  She has had a relative pass away and she is having difficulty with anxiety, grief, and panic   Review of Systems    No chest pain, SOB, HA, dizziness, vision change, N/V, diarrhea, constipation, dysuria, urinary urgency or frequency, myalgias, arthralgias or rash.  Objective:   Physical Exam Vital signs noted  Well developed well nourished female.  HEENT - Head atraumatic Normocephalic Respiratory - Lungs CTA bilateral Cardiac - RRR S1 and S2 without murmur GI - Abdomen soft Nontender and bowel sounds active x 4 Extremities - No edema. Neuro - Grossly intact.       Assessment & Plan:  Grief - xanax 0.5mg  one po tid prn #30w/3 rf  Follow up prn  Lysbeth Penner FNP

## 2013-10-10 NOTE — Telephone Encounter (Signed)
NTBS.

## 2013-10-10 NOTE — Telephone Encounter (Signed)
Appointment made with [facility] for today.

## 2013-11-18 ENCOUNTER — Ambulatory Visit (INDEPENDENT_AMBULATORY_CARE_PROVIDER_SITE_OTHER): Payer: 59 | Admitting: Family Medicine

## 2013-11-18 ENCOUNTER — Encounter: Payer: Self-pay | Admitting: Family Medicine

## 2013-11-18 VITALS — BP 105/72 | HR 69 | Temp 98.8°F | Ht 65.75 in | Wt 182.6 lb

## 2013-11-18 DIAGNOSIS — R072 Precordial pain: Secondary | ICD-10-CM

## 2013-11-18 LAB — POCT CBC
Granulocyte percent: 65.7 %G (ref 37–80)
HCT, POC: 39.7 % (ref 37.7–47.9)
Hemoglobin: 13.1 g/dL (ref 12.2–16.2)
Lymph, poc: 2.6 (ref 0.6–3.4)
MCH, POC: 29 pg (ref 27–31.2)
MCHC: 33 g/dL (ref 31.8–35.4)
MCV: 87.7 fL (ref 80–97)
MPV: 9 fL (ref 0–99.8)
POC Granulocyte: 6 (ref 2–6.9)
POC LYMPH PERCENT: 28.3 %L (ref 10–50)
Platelet Count, POC: 315 10*3/uL (ref 142–424)
RBC: 4.5 M/uL (ref 4.04–5.48)
RDW, POC: 13.7 %
WBC: 9.1 10*3/uL (ref 4.6–10.2)

## 2013-11-18 MED ORDER — NAPROXEN 500 MG PO TABS: 500.0000 mg | ORAL_TABLET | Freq: Two times a day (BID) | ORAL | Status: DC

## 2013-11-18 NOTE — Progress Notes (Signed)
   Subjective:    Patient ID: Kathy Ewing, female    DOB: 1970-06-13, 43 y.o.   MRN: [id number]  HPI Patient is here for follow up.  She was seen in the ED last night for chest pain and she had normal EKG and negative CE's and was advised to follow up with PCP for chest pain due to anxiety. She has significant family hx of CAD.    Review of Systems    No chest pain, SOB, HA, dizziness, vision change, N/V, diarrhea, constipation, dysuria, urinary urgency or frequency, myalgias, arthralgias or rash.  Objective:    BP 105/72 mmHg  Pulse 69  Temp(Src) 98.8 F (37.1 C) (Oral)  Ht 5' 5.75" (1.67 m)  Wt 182 lb 9.6 oz (82.827 kg)  BMI 29.70 kg/m2  LMP 11/11/2013 Physical Exam Vital signs noted  Well developed well nourished female.  HEENT - Head atraumatic Normocephalic                Eyes - PERRLA, Conjuctiva - clear Sclera- Clear EOMI                Ears - EAC's Wnl TM's Wnl Gross Hearing WNL                Nose - Nares patent                 Throat - oropharanx wnl Respiratory - Lungs CTA bilateral Cardiac - RRR S1 and S2 without murmur GI - Abdomen soft Nontender and bowel sounds active x 4 Extremities - No edema. Neuro - Grossly intact.       Assessment & Plan:     ICD-9-CM ICD-10-CM   1. Precordial pain 786.51 R07.2 Ambulatory referral to Cardiology     POCT CBC     BMP8+EGFR     Vitamin B12     Vit D  25 hydroxy (rtn osteoporosis monitoring)     naproxen (NAPROSYN) 500 MG tablet   Discussed with patient that she had panic attack and her chest pain is probably due to cigarette smoking and costochondritis.   Take baby ASA po qd.  No Follow-up on file.  Lysbeth Penner FNP

## 2013-11-19 ENCOUNTER — Other Ambulatory Visit: Payer: Self-pay | Admitting: Family Medicine

## 2013-11-19 LAB — BMP8+EGFR
BUN/Creatinine Ratio: 12 (ref 9–23)
BUN: 9 mg/dL (ref 6–24)
CO2: 22 mmol/L (ref 18–29)
Calcium: 8.9 mg/dL (ref 8.7–10.2)
Chloride: 105 mmol/L (ref 97–108)
Creatinine, Ser: 0.74 mg/dL (ref 0.57–1.00)
GFR calc Af Amer: 116 mL/min/{1.73_m2} (ref >59)
GFR calc non Af Amer: 101 mL/min/{1.73_m2} (ref >59)
Glucose: 71 mg/dL (ref 65–99)
Potassium: 4.3 mmol/L (ref 3.5–5.2)
Sodium: 143 mmol/L (ref 134–144)

## 2013-11-19 LAB — VITAMIN D 25 HYDROXY (VIT D DEFICIENCY, FRACTURES): Vit D, 25-Hydroxy: 18.4 ng/mL — ABNORMAL LOW (ref 30.0–100.0)

## 2013-11-19 LAB — VITAMIN B12: Vitamin B-12: 264 pg/mL (ref 211–946)

## 2013-11-19 MED ORDER — CYANOCOBALAMIN 1000 MCG/ML IJ SOLN: INTRAMUSCULAR | Status: DC

## 2013-11-19 MED ORDER — VITAMIN D (ERGOCALCIFEROL) 1.25 MG (50000 UNIT) PO CAPS: 50000.0000 [IU] | ORAL_CAPSULE | ORAL | Status: DC

## 2013-11-20 ENCOUNTER — Telehealth: Payer: Self-pay | Admitting: Family Medicine

## 2013-11-20 ENCOUNTER — Other Ambulatory Visit: Payer: Self-pay | Admitting: Family Medicine

## 2013-11-20 NOTE — Telephone Encounter (Signed)
Please follow up

## 2013-11-20 NOTE — Telephone Encounter (Signed)
VM left as pt requested to make appt here.

## 2013-11-21 DIAGNOSIS — Z029 Encounter for administrative examinations, unspecified: Secondary | ICD-10-CM

## 2013-11-21 NOTE — Telephone Encounter (Signed)
Pt aware to make appt  

## 2013-11-22 ENCOUNTER — Telehealth: Payer: Self-pay | Admitting: Family Medicine

## 2013-11-25 NOTE — Telephone Encounter (Signed)
New fax # put on forms

## 2013-11-27 ENCOUNTER — Ambulatory Visit (INDEPENDENT_AMBULATORY_CARE_PROVIDER_SITE_OTHER): Payer: PRIVATE HEALTH INSURANCE | Admitting: Cardiovascular Disease

## 2013-11-27 ENCOUNTER — Encounter: Payer: Self-pay | Admitting: Cardiovascular Disease

## 2013-11-27 ENCOUNTER — Telehealth: Payer: Self-pay | Admitting: Cardiovascular Disease

## 2013-11-27 ENCOUNTER — Encounter: Payer: Self-pay | Admitting: *Deleted

## 2013-11-27 ENCOUNTER — Other Ambulatory Visit (HOSPITAL_COMMUNITY): Payer: Self-pay | Admitting: Cardiology

## 2013-11-27 VITALS — BP 120/82 | HR 78 | Ht 65.75 in | Wt 181.0 lb

## 2013-11-27 DIAGNOSIS — R079 Chest pain, unspecified: Secondary | ICD-10-CM

## 2013-11-27 DIAGNOSIS — R0602 Shortness of breath: Secondary | ICD-10-CM

## 2013-11-27 DIAGNOSIS — Z716 Tobacco abuse counseling: Secondary | ICD-10-CM

## 2013-11-27 NOTE — Progress Notes (Signed)
Patient ID: Kathy Ewing, female   DOB: [birth year], 43 y.o.   MRN: [id number]       CARDIOLOGY CONSULT NOTE  Patient ID: Kathy Ewing MRN: [id number] DOB/AGE: 17-Oct-1970 43 y.o.  Admit date: (Not on file) Primary Physician Redge Gainer, MD  Reason for Consultation: chest pain  HPI:  The patient is a 43 year old woman with a history of anxiety and tobacco abuse who was reportedly evaluated in the ED for chest pain. She subsequent followed up with her primary care provider who felt it was due to anxiety and musculoskeletal chest wall pain. However, due to a family history of coronary artery disease, she has been referred for further evaluation. She has trouble with anxiety ever since her 7-1/2-year-old daughter passed away several years ago. However, for the past 3 weeks, she has been noticing left-sided chest pain which is been associated with shortness of breath and occasionally lightheadedness. It does not radiate to the back, shoulder, or down the left arm. On one occasion the whole left side of her body felt numb. She denies orthopnea and leg swelling. She occasionally wakes up short of breath. She may experience chest pain when walking on the cement floors at work, and this is alleviated with rest. She said the pain she has been recently experiencing is different than the chest pains experienced during times of anxiety.  ECG performed in the office today demonstrates normal sinus rhythm with no ischemic ST segment or T-wave abnormalities.  Soc: Smokes 0.5 ppd since she was a teenager. Works at [employer]. Has a daughter in 10th grade, Kaitlyn.  Fam: Sister died suddenly of unknown causes recently at age 68. Both grandfathers had CAD.   No Known Allergies  Current Outpatient Prescriptions  Medication Sig Dispense Refill  . ALPRAZolam (XANAX) 0.5 MG tablet Take 1 tablet (0.5 mg total) by mouth 3 (three) times daily as needed for anxiety. 30 tablet 3  . buPROPion (WELLBUTRIN  XL) 150 MG 24 hr tablet Take 1 tablet (150 mg total) by mouth daily. 30 tablet 11  . butalbital-acetaminophen-caffeine (FIORICET) 50-325-40 MG per tablet Take 1-2 tablets by mouth every 6 (six) hours as needed for headache. 30 tablet 1  . cyanocobalamin (,VITAMIN B-12,) 1000 MCG/ML injection Inject one ml IM daily for a week then weekly for 4 weeks then monthly 10 mL 1  . naproxen (NAPROSYN) 500 MG tablet Take 1 tablet (500 mg total) by mouth 2 (two) times daily with a meal. 30 tablet 0  . SUMAtriptan (IMITREX) 100 MG tablet Take 1 tablet (100 mg total) by mouth every 2 (two) hours as needed for migraine or headache. May repeat in 2 hours if headache persists or recurs. 10 tablet 1  . Vitamin D, Ergocalciferol, (DRISDOL) 50000 UNITS CAPS capsule Take 1 capsule (50,000 Units total) by mouth every 7 (seven) days. 18 capsule 0   No current facility-administered medications for this visit.    No past medical history on file.  Past Surgical History  Procedure Laterality Date  . Cesarean section      History   Social History  . Marital Status: Married    Spouse Name: N/A    Number of Children: N/A  . Years of Education: N/A   Occupational History  . Not on file.   Social History Main Topics  . Smoking status: Current Every Day Smoker -- 0.50 packs/day    Types: Cigarettes  . Smokeless tobacco: Not on file  . Alcohol Use: No  .  Drug Use: No  . Sexual Activity: Not on file   Other Topics Concern  . Not on file   Social History Narrative       Prior to Admission medications   Medication Sig Start Date End Date Taking? Authorizing Provider  ALPRAZolam Duanne Moron) 0.5 MG tablet Take 1 tablet (0.5 mg total) by mouth 3 (three) times daily as needed for anxiety. 10/10/13   Lysbeth Penner, FNP  buPROPion (WELLBUTRIN XL) 150 MG 24 hr tablet Take 1 tablet (150 mg total) by mouth daily. 09/18/13   Lysbeth Penner, FNP  butalbital-acetaminophen-caffeine (FIORICET) [phone] MG per tablet  Take 1-2 tablets by mouth every 6 (six) hours as needed for headache. 09/18/13 09/18/14  Lysbeth Penner, FNP  cyanocobalamin (,VITAMIN B-12,) 1000 MCG/ML injection Inject one ml IM daily for a week then weekly for 4 weeks then monthly 11/19/13   Lysbeth Penner, FNP  naproxen (NAPROSYN) 500 MG tablet Take 1 tablet (500 mg total) by mouth 2 (two) times daily with a meal. 11/18/13   Lysbeth Penner, FNP  SUMAtriptan (IMITREX) 100 MG tablet Take 1 tablet (100 mg total) by mouth every 2 (two) hours as needed for migraine or headache. May repeat in 2 hours if headache persists or recurs. 09/18/13   Lysbeth Penner, FNP  Vitamin D, Ergocalciferol, (DRISDOL) 50000 UNITS CAPS capsule Take 1 capsule (50,000 Units total) by mouth every 7 (seven) days. 11/19/13   Lysbeth Penner, FNP     Review of systems complete and found to be negative unless listed above in HPI   BP 120/82  Pulse 78  Weight 181 lb (82.101 kg) Height 5' 5.75" (1.67 m)    Physical exam Height 5' 5.75" (1.67 m), last menstrual period 11/11/2013. General: NAD Neck: No JVD, no thyromegaly or thyroid nodule.  Lungs: Clear to auscultation bilaterally with normal respiratory effort. CV: Nondisplaced PMI. Regular rate and rhythm, normal S1/S2, no S3/S4, no murmur.  No peripheral edema.  No carotid bruit.  Normal pedal pulses.  Abdomen: Soft, nontender, no hepatosplenomegaly, no distention.  Skin: Intact without lesions or rashes.  Neurologic: Alert and oriented x 3.  Psych: Normal affect. Extremities: No clubbing or cyanosis.  HEENT: Normal.   ECG: Most recent ECG reviewed.  Labs:   Lab Results  Component Value Date   WBC 9.1 11/18/2013   HGB 13.1 11/18/2013   HCT 39.7 11/18/2013   MCV 87.7 11/18/2013   PLT 345 08/17/2010   No results for input(s): NA, K, CL, CO2, BUN, CREATININE, CALCIUM, PROT, BILITOT, ALKPHOS, ALT, AST, GLUCOSE in the last 168 hours.  Invalid input(s): LABALBU No results found for: CKTOTAL, CKMB,  CKMBINDEX, TROPONINI No results found for: CHOL No results found for: HDL No results found for: LDLCALC No results found for: TRIG No results found for: CHOLHDL No results found for: LDLDIRECT       Studies: No results found.  ASSESSMENT AND PLAN:  1. Chest pain and shortness of breath: Has both typical and atypical features of ischemic heart disease, as sometimes provoked with exertion and alleviated with rest. Risk factors include tobacco abuse. Will obtain an exercise treadmill stress test and an echocardiogram for further clarification. 2. Tobacco abuse: Extensive cessation counseling provided.  Dispo: f/u 1 month.  Signed: Kate Sable, M.D., F.A.C.C.  11/27/2013, 1:42 PM

## 2013-11-27 NOTE — Patient Instructions (Signed)
Your physician recommends that you schedule a follow-up appointment in: 1 month. Your physician recommends that you continue on your current medications as directed. Please refer to the Current Medication list given to you today. Your physician has requested that you have an echocardiogram. Echocardiography is a painless test that uses sound waves to create images of your heart. It provides your doctor with information about the size and shape of your heart and how well your heart's chambers and valves are working. This procedure takes approximately one hour. There are no restrictions for this procedure. Your physician has requested that you have an exercise tolerance test. For further information please visit [email or web address]. Please also follow instruction sheet, as given.

## 2013-11-27 NOTE — Telephone Encounter (Signed)
GXT QI:HKVQQ pain  - schedule at [facility] on Nov 13th  Echo dx: chest pain - scheduled in  [place]

## 2013-11-28 ENCOUNTER — Other Ambulatory Visit (INDEPENDENT_AMBULATORY_CARE_PROVIDER_SITE_OTHER): Payer: PRIVATE HEALTH INSURANCE

## 2013-11-28 ENCOUNTER — Other Ambulatory Visit: Payer: Self-pay

## 2013-11-28 DIAGNOSIS — R079 Chest pain, unspecified: Secondary | ICD-10-CM

## 2013-11-28 DIAGNOSIS — R0602 Shortness of breath: Secondary | ICD-10-CM

## 2013-11-29 ENCOUNTER — Ambulatory Visit (HOSPITAL_COMMUNITY)
Admission: RE | Admit: 2013-11-29 | Discharge: 2013-11-29 | Disposition: A | Discharge status: Home / Self Care | Payer: PRIVATE HEALTH INSURANCE | Source: Ambulatory Visit | Attending: Cardiovascular Disease | Admitting: Cardiovascular Disease

## 2013-11-29 ENCOUNTER — Encounter: Payer: Self-pay | Admitting: Cardiology

## 2013-11-29 ENCOUNTER — Telehealth: Payer: Self-pay | Admitting: *Deleted

## 2013-11-29 DIAGNOSIS — R079 Chest pain, unspecified: Secondary | ICD-10-CM

## 2013-11-29 NOTE — Telephone Encounter (Signed)
-----   Message from Herminio Commons, MD sent at 11/29/2013  8:35 AM EST ----- Normal heart function.

## 2013-11-29 NOTE — Progress Notes (Signed)
Stress Lab Nurses Notes - [facility]  SUNI JARNAGIN 11/29/2013 Reason for doing test: Chest Pain Type of test: Regular GTX Nurse performing test: Gerrit Halls, RN Nuclear Medicine Tech: Not Applicable Echo Tech: Not Applicable MD performing test: S. McDowell/K.Purcell Nails NP Family MD: Laurance Flatten Test explained and consent signed: Yes.  IV started: No IV started Symptoms: Chest discomfort ( achy) Treatment/Intervention: None Reason test stopped: fatigue After recovery IV was: NA Patient to return to [place]. Med at : NA Patient discharged: Home Patient's Condition upon discharge was: Stable Comments: During test peak BP 146/81 & HR 169 . Recovery BP 108/70 & HR 87 . Symptoms resolved in recovery. Geanie Cooley T  Attending note:  Patient exercised on Bruce protocol for 9:33 reaching maximum workload 11.9 METS and peak heart rate 169 BPM, 94% MPHR. Peak blood pressure 146/81. Nonliimiting chest pain reported. No clearly diagnostic ST segment changes noted. No arrhythmias. Overall low risk Duke treadmill score of 5.5.  Satira Sark, M.D., F.A.C.C.

## 2013-11-29 NOTE — Telephone Encounter (Signed)
Patient informed. 

## 2013-11-29 NOTE — Progress Notes (Signed)
Stress Lab Nurses Notes - [facility]  [name] 11/29/2013 Reason for doing test: Chest Pain Type of test: Regular GTX Nurse performing test: Gerrit Halls, RN Nuclear Medicine Tech: Not Applicable Echo Tech: Not Applicable MD performing test: S. McDowell/K.Purcell Nails NP Family MD: Laurance Flatten Test explained and consent signed: Yes.   IV started: No IV started Symptoms: Chest discomfort ( achy) Treatment/Intervention: None Reason test stopped: fatigue After recovery IV was: NA Patient to return to [place]. Med at : NA Patient discharged: Home Patient's Condition upon discharge was: stable Comments: During test peak BP 146/81 & HR 169 .  Recovery BP 108/70 & HR 87 .  Symptoms resolved in recovery. Kathy Ewing

## 2013-11-29 NOTE — Telephone Encounter (Signed)
[name] [phone] [name] NO PAC RQD FOR CPT Honor, CALL [phone]

## 2013-12-05 ENCOUNTER — Telehealth: Payer: Self-pay | Admitting: Family Medicine

## 2013-12-05 ENCOUNTER — Telehealth: Payer: Self-pay | Admitting: *Deleted

## 2013-12-05 NOTE — Telephone Encounter (Signed)
Patient notified.  OV scheduled for tomorrow at 1:20 with Dr. Darden Amber office.

## 2013-12-05 NOTE — Telephone Encounter (Signed)
Patient called with c/o chest pain (5/10) currently.  Is currently at work.  Stated she had really bad episode this morning (9/10), but did not go to ED.  No c/o SOB, dizziness, or numbness currently.  Her Echo on 11/28/13 was normal.  She also is asking for stress test results, but do not see any attached to test yet.

## 2013-12-05 NOTE — Telephone Encounter (Signed)
Stress test was low risk, although she did have chest pain. I could see her in the office this afternoon or tomorrow afternoon.

## 2013-12-06 ENCOUNTER — Ambulatory Visit (INDEPENDENT_AMBULATORY_CARE_PROVIDER_SITE_OTHER): Payer: PRIVATE HEALTH INSURANCE | Admitting: Cardiovascular Disease

## 2013-12-06 ENCOUNTER — Encounter: Payer: Self-pay | Admitting: Cardiovascular Disease

## 2013-12-06 VITALS — BP 104/70 | HR 82 | Ht 66.0 in | Wt 186.0 lb

## 2013-12-06 DIAGNOSIS — Z716 Tobacco abuse counseling: Secondary | ICD-10-CM

## 2013-12-06 DIAGNOSIS — K219 Gastro-esophageal reflux disease without esophagitis: Secondary | ICD-10-CM

## 2013-12-06 DIAGNOSIS — Z136 Encounter for screening for cardiovascular disorders: Secondary | ICD-10-CM

## 2013-12-06 DIAGNOSIS — IMO0001 Reserved for inherently not codable concepts without codable children: Secondary | ICD-10-CM

## 2013-12-06 DIAGNOSIS — Z720 Tobacco use: Secondary | ICD-10-CM | POA: Insufficient documentation

## 2013-12-06 DIAGNOSIS — R079 Chest pain, unspecified: Secondary | ICD-10-CM

## 2013-12-06 DIAGNOSIS — F419 Anxiety disorder, unspecified: Secondary | ICD-10-CM | POA: Insufficient documentation

## 2013-12-06 MED ORDER — OMEPRAZOLE 20 MG PO CPDR: 20.0000 mg | DELAYED_RELEASE_CAPSULE | Freq: Every day | ORAL | Status: DC

## 2013-12-06 NOTE — Telephone Encounter (Signed)
Appointment scheduled for 12/3 with [facility]

## 2013-12-06 NOTE — Patient Instructions (Addendum)
Your physician recommends that you schedule a follow-up appointment in: only as needed    Your physician has recommended you make the following change in your medication;     START Omeprazole 20 mg daily    I have given you an Rx for Alprazolam 0.5 mg 3 times a day as needed for anxiety.No refills        Thank you for choosing [facility] !

## 2013-12-06 NOTE — Progress Notes (Signed)
Patient ID: Kathy Ewing, female   DOB: 1970/09/06, 43 y.o.   MRN: [id number]      SUBJECTIVE: The patient returns for follow up after undergoing cardiovascular testing performed for the evaluation of chest pain. She has a h/o anxiety and tobacco abuse, and a family h/o premature CAD. Echocardiogram demonstrated normal LV systolic function and regional wall motion, EF 60-65%. Exercise treadmill stress test was low risk with a Duke treadmill score of 5.5. She exercised for 9:33 and achieved 11.9 METS and experienced non-limiting chest pain.  She again experienced chest pain while lying down in bed, not associated with shortness of breath or dizziness. She felt the left side of her body become numb.   Review of Systems: As per "subjective", otherwise negative.  No Known Allergies  Current Outpatient Prescriptions  Medication Sig Dispense Refill  . ALPRAZolam (XANAX) 0.5 MG tablet Take 1 tablet (0.5 mg total) by mouth 3 (three) times daily as needed for anxiety. 30 tablet 3  . buPROPion (WELLBUTRIN XL) 150 MG 24 hr tablet Take 1 tablet (150 mg total) by mouth daily. 30 tablet 11  . butalbital-acetaminophen-caffeine (FIORICET) 50-325-40 MG per tablet Take 1-2 tablets by mouth every 6 (six) hours as needed for headache. 30 tablet 1  . cyanocobalamin (,VITAMIN B-12,) 1000 MCG/ML injection Inject one ml IM daily for a week then weekly for 4 weeks then monthly 10 mL 1  . naproxen (NAPROSYN) 500 MG tablet Take 1 tablet (500 mg total) by mouth 2 (two) times daily with a meal. 30 tablet 0  . SUMAtriptan (IMITREX) 100 MG tablet Take 1 tablet (100 mg total) by mouth every 2 (two) hours as needed for migraine or headache. May repeat in 2 hours if headache persists or recurs. 10 tablet 1  . Vitamin D, Ergocalciferol, (DRISDOL) 50000 UNITS CAPS capsule Take 1 capsule (50,000 Units total) by mouth every 7 (seven) days. 18 capsule 0   No current facility-administered medications for this visit.    No  past medical history on file.  Past Surgical History  Procedure Laterality Date  . Cesarean section      History   Social History  . Marital Status: Married    Spouse Name: N/A    Number of Children: N/A  . Years of Education: N/A   Occupational History  . Not on file.   Social History Main Topics  . Smoking status: Current Every Day Smoker -- 0.50 packs/day    Types: Cigarettes    Start date: 12/07/1990  . Smokeless tobacco: Not on file  . Alcohol Use: No  . Drug Use: No  . Sexual Activity: Not on file   Other Topics Concern  . Not on file   Social History Narrative     Filed Vitals:   12/06/13 1339  BP: 104/70  Pulse: 82  Height: 5\' 6"  (1.676 m)  Weight: 186 lb (84.369 kg)    PHYSICAL EXAM General: NAD HEENT: Normal. Neck: No JVD, no thyromegaly. Lungs: Clear to auscultation bilaterally with normal respiratory effort. CV: Nondisplaced PMI.  Regular rate and rhythm, normal S1/S2, no S3/S4, no murmur. +chest wall tenderness. No pretibial or periankle edema.  No carotid bruit.  Normal pedal pulses.  Abdomen: Soft, nontender, no hepatosplenomegaly, no distention.  Neurologic: Alert and oriented x 3.  Psych: Normal affect. Skin: Normal. Musculoskeletal: Normal range of motion, no gross deformities. Extremities: No clubbing or cyanosis.   ECG: Most recent ECG reviewed.      ASSESSMENT AND  PLAN: 1. Chest pain: This appears to be musculoskeletal in etiology, as she has chest wall tenderness. Echo and stress were normal as noted above, with very good exercise tolerance. There is also a component of GERD, and she claims she has had gastritis for many years and takes OTC remedies. I will prescribe omeprazole 20 mg daily. 2. Anxiety: She has struggled with anxiety since a daughter passed away. I will prescribe Xanax 0.5 mg TID prn and provide a two week supply with no refills, until she is able to see her PCP. 3. Tobacco abuse: Cessation counseling  provided.  Dispo: f/u prn.  Kate Sable, M.D., F.A.C.C.

## 2013-12-06 NOTE — Telephone Encounter (Signed)
Patient aware that she will need to be seen in order to get on medication for anxiety and appointment scheduled for 12/3 with [facility]

## 2013-12-19 ENCOUNTER — Ambulatory Visit (INDEPENDENT_AMBULATORY_CARE_PROVIDER_SITE_OTHER): Payer: 59 | Admitting: Family Medicine

## 2013-12-19 ENCOUNTER — Encounter: Payer: Self-pay | Admitting: Family Medicine

## 2013-12-19 VITALS — BP 112/72 | HR 74 | Temp 96.5°F | Ht 66.0 in | Wt 188.2 lb

## 2013-12-19 DIAGNOSIS — F411 Generalized anxiety disorder: Secondary | ICD-10-CM

## 2013-12-19 DIAGNOSIS — N760 Acute vaginitis: Secondary | ICD-10-CM

## 2013-12-19 DIAGNOSIS — A499 Bacterial infection, unspecified: Secondary | ICD-10-CM

## 2013-12-19 DIAGNOSIS — B9689 Other specified bacterial agents as the cause of diseases classified elsewhere: Secondary | ICD-10-CM

## 2013-12-19 DIAGNOSIS — R200 Anesthesia of skin: Secondary | ICD-10-CM

## 2013-12-19 MED ORDER — METRONIDAZOLE 500 MG PO TABS: 500.0000 mg | ORAL_TABLET | Freq: Two times a day (BID) | ORAL | Status: DC

## 2013-12-19 MED ORDER — ALPRAZOLAM 0.5 MG PO TABS: ORAL_TABLET | ORAL | Status: DC

## 2013-12-19 NOTE — Progress Notes (Signed)
   Subjective:    Patient ID: Kathy Ewing, female    DOB: December 10, 1970, 43 y.o.   MRN: [id number]  HPI Patient is here for c/o vaginal DC.  She has been getting BV and she states it is back.  She has been having anxiety and needs a refill on her xanax.  She is having persistent numbness and tingling and she states her husband really wants her to have a neurology referral to r/o MS.  She states her sister has MS.  She has been dx'd with b12 deficiency and she has not started her b12 injections yet.    Review of Systems  Constitutional: Negative for fever.  HENT: Negative for ear pain.   Eyes: Negative for discharge.  Respiratory: Negative for cough.   Cardiovascular: Negative for chest pain.  Gastrointestinal: Negative for abdominal distention.  Endocrine: Negative for polyuria.  Genitourinary: Negative for difficulty urinating.  Musculoskeletal: Negative for gait problem and neck pain.  Skin: Negative for color change and rash.  Neurological: Negative for speech difficulty and headaches.  Psychiatric/Behavioral: Negative for agitation.       Objective:    BP 112/72 mmHg  Pulse 74  Temp(Src) 96.5 F (35.8 C) (Oral)  Ht 5\' 6"  (1.676 m)  Wt 188 lb 3.2 oz (85.367 kg)  BMI 30.39 kg/m2  LMP 12/16/2013   Physical Exam  Constitutional: She is oriented to person, place, and time. She appears well-developed and well-nourished.  HENT:  Head: Normocephalic and atraumatic.  Mouth/Throat: Oropharynx is clear and moist.  Eyes: Pupils are equal, round, and reactive to light.  Neck: Normal range of motion. Neck supple.  Cardiovascular: Normal rate and regular rhythm.   No murmur heard. Pulmonary/Chest: Effort normal and breath sounds normal.  Abdominal: Soft. Bowel sounds are normal. There is no tenderness.  Neurological: She is alert and oriented to person, place, and time.  Skin: Skin is warm and dry.  Psychiatric: She has a normal mood and affect.          Assessment & Plan:       ICD-9-CM ICD-10-CM   1. GAD (generalized anxiety disorder) 300.02 F41.1 ALPRAZolam (XANAX) 0.5 MG tablet  2. Numbness 782.0 R20.0 Ambulatory referral to Neurology  3. BV (bacterial vaginosis) 616.10 N76.0 metroNIDAZOLE (FLAGYL) 500 MG tablet   041.9 A49.9      No Follow-up on file.  Lysbeth Penner FNP

## 2013-12-23 ENCOUNTER — Institutional Professional Consult (permissible substitution): Payer: Self-pay | Admitting: Cardiovascular Disease

## 2014-01-13 ENCOUNTER — Ambulatory Visit: Payer: PRIVATE HEALTH INSURANCE | Admitting: Cardiovascular Disease

## 2014-01-20 ENCOUNTER — Telehealth: Payer: Self-pay | Admitting: *Deleted

## 2014-01-20 ENCOUNTER — Ambulatory Visit (INDEPENDENT_AMBULATORY_CARE_PROVIDER_SITE_OTHER): Payer: 59 | Admitting: *Deleted

## 2014-01-20 DIAGNOSIS — E538 Deficiency of other specified B group vitamins: Secondary | ICD-10-CM

## 2014-01-20 MED ORDER — CYANOCOBALAMIN 1000 MCG/ML IJ SOLN
1000.0000 ug | Freq: Every morning | INTRAMUSCULAR | Status: AC
  Administered 2014-01-20 – 2014-01-24 (×4): 1000 ug via INTRAMUSCULAR

## 2014-01-20 NOTE — Patient Instructions (Signed)

## 2014-01-20 NOTE — Progress Notes (Signed)
Pt given B12 injection IM left deltoid, pt tolerated injection well. 

## 2014-01-21 ENCOUNTER — Ambulatory Visit (INDEPENDENT_AMBULATORY_CARE_PROVIDER_SITE_OTHER): Payer: 59 | Admitting: *Deleted

## 2014-01-21 DIAGNOSIS — E538 Deficiency of other specified B group vitamins: Secondary | ICD-10-CM

## 2014-01-21 NOTE — Progress Notes (Signed)
Vitamin b12 injection given and tolerated well.  

## 2014-01-22 ENCOUNTER — Ambulatory Visit (INDEPENDENT_AMBULATORY_CARE_PROVIDER_SITE_OTHER): Payer: 59 | Admitting: Family Medicine

## 2014-01-22 ENCOUNTER — Ambulatory Visit: Payer: 59

## 2014-01-22 ENCOUNTER — Encounter: Payer: Self-pay | Admitting: Family Medicine

## 2014-01-22 VITALS — BP 116/75 | HR 89 | Temp 97.7°F | Ht 66.0 in | Wt 182.0 lb

## 2014-01-22 DIAGNOSIS — E538 Deficiency of other specified B group vitamins: Secondary | ICD-10-CM

## 2014-01-22 DIAGNOSIS — G43111 Migraine with aura, intractable, with status migrainosus: Secondary | ICD-10-CM

## 2014-01-22 MED ORDER — SUMATRIPTAN SUCCINATE 100 MG PO TABS: 100.0000 mg | ORAL_TABLET | ORAL | Status: DC | PRN

## 2014-01-22 MED ORDER — PROMETHAZINE HCL 25 MG PO TABS: 25.0000 mg | ORAL_TABLET | Freq: Three times a day (TID) | ORAL | Status: DC | PRN

## 2014-01-22 MED ORDER — CYANOCOBALAMIN 1000 MCG/ML IJ SOLN
1000.0000 ug | INTRAMUSCULAR | Status: DC
  Administered 2014-01-22 – 2014-02-19 (×5): 1000 ug via INTRAMUSCULAR

## 2014-01-22 MED ORDER — KETOROLAC TROMETHAMINE 30 MG/ML IJ SOLN
30.0000 mg | Freq: Once | INTRAMUSCULAR | Status: AC
  Administered 2014-01-22: 30 mg via INTRAMUSCULAR

## 2014-01-22 NOTE — Progress Notes (Signed)
   Subjective:    Patient ID: Kathy Ewing, female    DOB: 1970-01-25, 44 y.o.   MRN: [id number]  HPI Patient is here today with c/o migraine headache for the last few days. She is out of imitrex.  She is having phonophotophobia and nausea.  She is having severe headaches.  She has been out of work for the last 2 days.  Review of Systems  Constitutional: Negative for fever.  HENT: Negative for ear pain.   Eyes: Negative for discharge.  Respiratory: Negative for cough.   Cardiovascular: Negative for chest pain.  Gastrointestinal: Negative for abdominal distention.  Endocrine: Negative for polyuria.  Genitourinary: Negative for difficulty urinating.  Musculoskeletal: Negative for gait problem and neck pain.  Skin: Negative for color change and rash.  Neurological: Positive for headaches. Negative for speech difficulty.  Psychiatric/Behavioral: Negative for agitation.       Objective:    BP 116/75 mmHg  Pulse 89  Temp(Src) 97.7 F (36.5 C) (Oral)  Ht 5\' 6"  (1.676 m)  Wt 182 lb (82.555 kg)  BMI 29.39 kg/m2  LMP 01/12/2014 Physical Exam  Constitutional: She is oriented to person, place, and time. She appears well-developed and well-nourished.  HENT:  Head: Normocephalic and atraumatic.  Mouth/Throat: Oropharynx is clear and moist.  Eyes: Pupils are equal, round, and reactive to light.  Neck: Normal range of motion. Neck supple.  Cardiovascular: Normal rate and regular rhythm.   No murmur heard. Pulmonary/Chest: Effort normal and breath sounds normal.  Abdominal: Soft. Bowel sounds are normal. There is no tenderness.  Neurological: She is alert and oriented to person, place, and time.  Skin: Skin is warm and dry.  Psychiatric: She has a normal mood and affect.          Assessment & Plan:     ICD-9-CM ICD-10-CM   1. Intractable migraine with aura with status migrainosus 346.03 G43.111 ketorolac (TORADOL) 30 MG/ML injection 30 mg     promethazine (PHENERGAN) 25 MG  tablet     SUMAtriptan (IMITREX) 100 MG tablet  2. B12 deficiency 266.2 E53.8 cyanocobalamin ((VITAMIN B-12)) injection 1,000 mcg     No Follow-up on file.  Lysbeth Penner FNP

## 2014-01-23 ENCOUNTER — Ambulatory Visit (INDEPENDENT_AMBULATORY_CARE_PROVIDER_SITE_OTHER): Payer: 59 | Admitting: *Deleted

## 2014-01-23 DIAGNOSIS — E538 Deficiency of other specified B group vitamins: Secondary | ICD-10-CM

## 2014-01-23 NOTE — Progress Notes (Signed)
Pt given B12 injection IM left deltoid, pt tolerated injection well. 

## 2014-01-23 NOTE — Patient Instructions (Signed)

## 2014-01-24 ENCOUNTER — Ambulatory Visit (INDEPENDENT_AMBULATORY_CARE_PROVIDER_SITE_OTHER): Payer: 59 | Admitting: *Deleted

## 2014-01-24 DIAGNOSIS — E538 Deficiency of other specified B group vitamins: Secondary | ICD-10-CM

## 2014-01-24 NOTE — Progress Notes (Signed)
Pt tolerated inj well

## 2014-01-29 ENCOUNTER — Ambulatory Visit (INDEPENDENT_AMBULATORY_CARE_PROVIDER_SITE_OTHER): Payer: 59 | Admitting: *Deleted

## 2014-01-29 DIAGNOSIS — E538 Deficiency of other specified B group vitamins: Secondary | ICD-10-CM

## 2014-01-29 NOTE — Progress Notes (Signed)
Pt given B12 injection IM left deltoid, pt tolerated injection well. 

## 2014-01-29 NOTE — Patient Instructions (Signed)

## 2014-02-05 ENCOUNTER — Ambulatory Visit (INDEPENDENT_AMBULATORY_CARE_PROVIDER_SITE_OTHER): Payer: 59 | Admitting: *Deleted

## 2014-02-05 DIAGNOSIS — E538 Deficiency of other specified B group vitamins: Secondary | ICD-10-CM

## 2014-02-05 DIAGNOSIS — Z0289 Encounter for other administrative examinations: Secondary | ICD-10-CM

## 2014-02-05 NOTE — Patient Instructions (Signed)

## 2014-02-05 NOTE — Progress Notes (Signed)
Pt given B12 injection IM right deltoid, pt tolerated injection well. 

## 2014-02-07 ENCOUNTER — Ambulatory Visit: Payer: PRIVATE HEALTH INSURANCE | Admitting: Neurology

## 2014-02-10 ENCOUNTER — Ambulatory Visit (INDEPENDENT_AMBULATORY_CARE_PROVIDER_SITE_OTHER): Payer: PRIVATE HEALTH INSURANCE | Admitting: Neurology

## 2014-02-10 ENCOUNTER — Encounter: Payer: Self-pay | Admitting: Neurology

## 2014-02-10 VITALS — BP 110/68 | HR 86 | Ht 66.0 in | Wt 200.0 lb

## 2014-02-10 DIAGNOSIS — E538 Deficiency of other specified B group vitamins: Secondary | ICD-10-CM

## 2014-02-10 DIAGNOSIS — F17200 Nicotine dependence, unspecified, uncomplicated: Secondary | ICD-10-CM

## 2014-02-10 DIAGNOSIS — G43109 Migraine with aura, not intractable, without status migrainosus: Secondary | ICD-10-CM

## 2014-02-10 DIAGNOSIS — R51 Headache: Secondary | ICD-10-CM

## 2014-02-10 DIAGNOSIS — Z720 Tobacco use: Secondary | ICD-10-CM

## 2014-02-10 DIAGNOSIS — R202 Paresthesia of skin: Secondary | ICD-10-CM

## 2014-02-10 DIAGNOSIS — R519 Headache, unspecified: Secondary | ICD-10-CM

## 2014-02-10 MED ORDER — NAPROXEN 500 MG PO TABS: ORAL_TABLET | ORAL | Status: DC

## 2014-02-10 MED ORDER — AMITRIPTYLINE HCL 10 MG PO TABS: ORAL_TABLET | ORAL | Status: DC

## 2014-02-10 NOTE — Progress Notes (Signed)
[facility] Clinic Note - Initial Visit   Date: 02/10/2014  Kathy Ewing MRN: [id number] DOB: February 27, 1970   Dear Nils Pyle, FNP:  Thank you for your kind referral of Kathy Ewing for consultation of migraines and generalized paresthesias. Although her history is well known to you, please allow [place] to reiterate it for the purpose of our medical record. The patient was accompanied to the clinic by self.    History of Present Illness: Kathy Ewing is a 44 y.o. right-handed Caucasian female with GERD, vitamin B12 deficiency (Jan 2016), anxiety, tobacco use, and migraines presenting for evaluation of migraines and generalized paresthesias.    She started having holocephalic headaches around 5 years ago, which is associated with nausea, vomiting, photophobia.  She complains of having a daily low-grade headache and severe migraine about twice per week.  She has not been on a daily preventative medication.  She has previously taken ibuprofen, tylenol, advil, and Aleve without any benefit.  Imitrex eases her pain, but does not completely alleviate the pain.  She also complains of numbness/tingling involving her whole body (face, arm, and legs).  She feels as if something is pricking her.  She also feel generalized fatigue, low energy, and low mood. In September 2015, she reports that her sister with multiple sclerosis passed away of unknown etiology. Afterwards, her numbness tingling started and headaches have become worse. She was also recently found to have vitamin B 12 deficiency and has started B 12 injections earlier this month.   Out-side paper records, electronic medical record, and images have been reviewed where available and summarized as:   Lab Results  Component Value Date   [id number] 264 11/18/2013    Past Medical History  Diagnosis Date  . B12 deficiency   . Vitamin D deficiency   . Anxiety   . Headache   . Numbness     Past Surgical  History  Procedure Laterality Date  . Cesarean section      x 2     Medications:  Current Outpatient Prescriptions on File Prior to Visit  Medication Sig Dispense Refill  . ALPRAZolam (XANAX) 0.5 MG tablet One to two po bid prn anxiety 60 tablet 3  . butalbital-acetaminophen-caffeine (FIORICET) 50-325-40 MG per tablet Take 1-2 tablets by mouth every 6 (six) hours as needed for headache. 30 tablet 1  . cyanocobalamin (,VITAMIN B-12,) 1000 MCG/ML injection Inject one ml IM daily for a week then weekly for 4 weeks then monthly 10 mL 1  . omeprazole (PRILOSEC) 20 MG capsule Take 1 capsule (20 mg total) by mouth daily. 30 capsule 11  . promethazine (PHENERGAN) 25 MG tablet Take 1 tablet (25 mg total) by mouth every 8 (eight) hours as needed for nausea or vomiting. 20 tablet 0  . SUMAtriptan (IMITREX) 100 MG tablet Take 1 tablet (100 mg total) by mouth every 2 (two) hours as needed for migraine or headache. May repeat in 2 hours if headache persists or recurs. 10 tablet 1  . Vitamin D, Ergocalciferol, (DRISDOL) 50000 UNITS CAPS capsule Take 1 capsule (50,000 Units total) by mouth every 7 (seven) days. 18 capsule 0  . promethazine (PHENERGAN) 25 MG tablet Take 1 tablet (25 mg total) by mouth every 6 (six) hours as needed for nausea. 30 tablet 0   Current Facility-Administered Medications on File Prior to Visit  Medication Dose Route Frequency Provider Last Rate Last Dose  . cyanocobalamin ((VITAMIN B-12)) injection 1,000 mcg  1,000 mcg  Intramuscular Q30 days Deatra Canter, FNP   1,000 mcg at 02/05/14 1507    Allergies: No Known Allergies  Family History: Family History  Problem Relation Age of Onset  . Migraines Sister   . Multiple sclerosis Sister     Deceased, 30  . Anxiety disorder Brother     x 2  . Hypertension Mother   . Cerebral palsy Daughter     Deceased, 11.5 years  . Depression Daughter     Social History: History   Social History  . Marital Status: Married     Spouse Name: N/A    Number of Children: N/A  . Years of Education: N/A   Occupational History  . Not on file.   Social History Main Topics  . Smoking status: Current Every Day Smoker -- 0.50 packs/day for 30 years    Types: Cigarettes  . Smokeless tobacco: Not on file  . Alcohol Use: No  . Drug Use: No  . Sexual Activity: Not on file   Other Topics Concern  . Not on file   Social History Narrative   She works at [employer] as a Associate Professor.   Highest level education:  9th grade   She lives with husband, daughter, and son.     Review of Systems:  CONSTITUTIONAL: No fevers, chills, night sweats, or weight loss.   EYES: No visual changes or eye pain ENT: No hearing changes.  No history of nose bleeds.   RESPIRATORY: No cough, wheezing and shortness of breath.   CARDIOVASCULAR: Negative for chest pain, and palpitations.   GI: Negative for abdominal discomfort, blood in stools or black stools.  No recent change in bowel habits.   GU:  No history of incontinence.   MUSCLOSKELETAL: No history of joint pain or swelling.  No myalgias.   SKIN: Negative for lesions, rash, and itching.   HEMATOLOGY/ONCOLOGY: Negative for prolonged bleeding, bruising easily, and swollen nodes.  No history of cancer.   ENDOCRINE: Negative for cold or heat intolerance, polydipsia or goiter.   PSYCH:  ++depression or anxiety symptoms.   NEURO: As Above.   Vital Signs:  BP 110/68 mmHg  Pulse 86  Ht 5\' 6"  (1.676 m)  Wt 200 lb (90.719 kg)  BMI 32.30 kg/m2  LMP 01/12/2014 Pain Scale: 8 on a scale of 0-10   General Medical Exam:   General:  Well appearing, comfortable.   Eyes/ENT: see cranial nerve examination.   Neck: No masses appreciated.  Full range of motion without tenderness.  No carotid bruits. Respiratory:  Clear to auscultation, good air entry bilaterally.   Cardiac:  Regular rate and rhythm, no murmur.   Extremities:  No deformities, edema, or skin discoloration.  Skin:  No rashes or  lesions.  Neurological Exam: MENTAL STATUS including orientation to time, place, person, recent and remote memory, attention span and concentration, language, and fund of knowledge is normal.  Speech is not dysarthric.  CRANIAL NERVES: II:  No visual field defects.  Unremarkable fundi.   III-IV-VI: Pupils equal round and reactive to light.  Normal conjugate, extra-ocular eye movements in all directions of gaze.  No nystagmus.  No ptosis.   V:  Normal facial sensation.   VII:  Normal facial symmetry and movements.  No pathologic facial reflexes.  VIII:  Normal hearing and vestibular function.   IX-X:  Normal palatal movement.   XI:  Normal shoulder shrug and head rotation.   XII:  Normal tongue strength and range  of motion, no deviation or fasciculation.  MOTOR:  No atrophy, fasciculations or abnormal movements.  No pronator drift.  Tone is normal.    Right Upper Extremity:    Left Upper Extremity:    Deltoid  5/5   Deltoid  5/5   Biceps  5/5   Biceps  5/5   Triceps  5/5   Triceps  5/5   Wrist extensors  5/5   Wrist extensors  5/5   Wrist flexors  5/5   Wrist flexors  5/5   Finger extensors  5/5   Finger extensors  5/5   Finger flexors  5/5   Finger flexors  5/5   Dorsal interossei  5/5   Dorsal interossei  5/5   Abductor pollicis  5/5   Abductor pollicis  5/5   Tone (Ashworth scale)  0  Tone (Ashworth scale)  0   Right Lower Extremity:    Left Lower Extremity:    Hip flexors  5/5   Hip flexors  5/5   Hip extensors  5/5   Hip extensors  5/5   Knee flexors  5/5   Knee flexors  5/5   Knee extensors  5/5   Knee extensors  5/5   Dorsiflexors  5/5   Dorsiflexors  5/5   Plantarflexors  5/5   Plantarflexors  5/5   Toe extensors  5/5   Toe extensors  5/5   Toe flexors  5/5   Toe flexors  5/5   Tone (Ashworth scale)  0  Tone (Ashworth scale)  0   MSRs:  Right                                                                 Left brachioradialis 3+  brachioradialis 3+  biceps 3+  biceps  3+  triceps 3+  triceps 3+  patellar 3+  patellar 3+  ankle jerk 2+  ankle jerk 2+  Hoffman no  Hoffman no  plantar response down  plantar response down   SENSORY:  Normal and symmetric perception of light touch, pinprick, vibration, and proprioception.  Romberg's sign absent.   COORDINATION/GAIT: Normal finger-to- nose-finger and heel-to-shin.  Intact rapid alternating movements bilaterally. Gait narrow based and stable, somewhat slow. Tandem and stressed gait intact.    IMPRESSION: Mrs. Acors is a 44 year-old female presenting for evaluation of worsening headaches and generalized paresthesias.  Her exam shows brisk symmetric and reflexes throughout, without associated upper motor neuron findings suggesting this is most likely normal for patient.  Sensory and motor exam is normal.  She has (1) chronic daily headaches, (2) migraine equivalent syndrome possibly manifesting with paresthesias, (3) generalized migrating paresthesias which do not conform to anatomical distribution due to vitamin B12 deficiency.  At this time, I encouraged her to continue vitamin B-12 injections as many of her nonspecific symptoms could be due to this. For her chronic daily headaches, I would like for her to start a daily preventative medication.  She has not been on any preventative therapy in the past and with her paresthesias, amitriptyline would be a good option.    PLAN/RECOMMENDATIONS:  1.  Start amitriptyline 10mg  at bedtime x 2 weeks, then increase to 2 tablet at bedtime. Common side effects discussed. 2.  Continue  vitamin B12 weekly, followed by monthly injections 3.  For moderate headaches, take naproxen 500mg  with benadryl 25mg  daily 4.  For severe headache, ok to take imitrex 100mg  at headache onset 5.  Limit all rescue medications to t   wice per week 6.  Consider behavorial counseling for coping mechanisms 7.  If no improvement, consider MRI brain going forward  8.  Tobacco cessation  encouraged 9.  Return to clinic in 3 months.   The duration of this appointment visit was 45 minutes of face-to-face time with the patient.  Greater than 50% of this time was spent in counseling, explanation of diagnosis, planning of further management, and coordination of care.   Thank you for allowing me to participate in patient's care.  If I can answer any additional questions, I would be pleased to do so.    Sincerely,    Torey Regan K. Allena Katz, DO

## 2014-02-10 NOTE — Patient Instructions (Signed)
1.  Start amitriptyline 10mg  at bedtime x 2 weeks, then increase to 2 tablet at bedtime 2.  Continue vitamin B12 103mcg weekly, followed by monthly injections 3.  For moderate headaches, take naproxen 500mg  with benadryl 25mg  daily 4.  For severe headache, ok to take imitrex 100mg  at headache onset 5.  Limit all rescue medications to twice per week 6.  Consider behavorial counseling for coping mechanisms 7.  If no improvement, consider MRI brain going forward  8.  Return to clinic in 3 months.

## 2014-02-12 ENCOUNTER — Ambulatory Visit (INDEPENDENT_AMBULATORY_CARE_PROVIDER_SITE_OTHER): Payer: 59 | Admitting: *Deleted

## 2014-02-12 DIAGNOSIS — E538 Deficiency of other specified B group vitamins: Secondary | ICD-10-CM

## 2014-02-12 NOTE — Patient Instructions (Signed)

## 2014-02-12 NOTE — Progress Notes (Signed)
Pt given B12 injection IM left deltoid, pt tolerated injection well. 

## 2014-02-19 ENCOUNTER — Ambulatory Visit (INDEPENDENT_AMBULATORY_CARE_PROVIDER_SITE_OTHER): Payer: 59 | Admitting: Family

## 2014-02-19 ENCOUNTER — Ambulatory Visit: Payer: 59

## 2014-02-19 ENCOUNTER — Encounter: Payer: Self-pay | Admitting: Family

## 2014-02-19 VITALS — BP 120/86 | HR 94 | Temp 97.0°F | Ht 66.0 in | Wt 199.4 lb

## 2014-02-19 DIAGNOSIS — F419 Anxiety disorder, unspecified: Secondary | ICD-10-CM

## 2014-02-19 DIAGNOSIS — Z23 Encounter for immunization: Secondary | ICD-10-CM

## 2014-02-19 DIAGNOSIS — B351 Tinea unguium: Secondary | ICD-10-CM

## 2014-02-19 DIAGNOSIS — E559 Vitamin D deficiency, unspecified: Secondary | ICD-10-CM

## 2014-02-19 DIAGNOSIS — E538 Deficiency of other specified B group vitamins: Secondary | ICD-10-CM | POA: Insufficient documentation

## 2014-02-19 DIAGNOSIS — Z0000 Encounter for general adult medical examination without abnormal findings: Secondary | ICD-10-CM

## 2014-02-19 DIAGNOSIS — K219 Gastro-esophageal reflux disease without esophagitis: Secondary | ICD-10-CM

## 2014-02-19 MED ORDER — ALPRAZOLAM 0.5 MG PO TABS: ORAL_TABLET | ORAL | Status: DC

## 2014-02-19 MED ORDER — TERBINAFINE HCL 250 MG PO TABS: 250.0000 mg | ORAL_TABLET | Freq: Every day | ORAL | Status: DC

## 2014-02-19 NOTE — Progress Notes (Signed)
Subjective:    Patient ID: Kathy Ewing, female    DOB: 01-Jan-1971, 44 y.o.   MRN: [id number]  Gastrophageal Reflux She reports no belching, no coughing, no heartburn, no sore throat or no wheezing. This is a chronic problem. The current episode started more than 1 year ago. The problem occurs rarely. The problem has been resolved. The symptoms are aggravated by certain foods. She has tried a PPI for the symptoms. The treatment provided significant relief.  Anxiety Presents for follow-up visit. Symptoms include nervous/anxious behavior and panic. Patient reports no confusion, decreased concentration, depressed mood, excessive worry, impotence, irritability, malaise, palpitations or shortness of breath. Symptoms occur rarely. The symptoms are aggravated by work stress. The quality of sleep is good.   Her past medical history is significant for anxiety/panic attacks. There is no history of depression. Past treatments include benzodiazephines.   * Pt see a neurologists last week and has another appt in April to manage her migraines.    Review of Systems  Constitutional: Negative.  Negative for irritability.  HENT: Negative.  Negative for sore throat.   Eyes: Negative.   Respiratory: Negative.  Negative for cough, shortness of breath and wheezing.   Cardiovascular: Negative.  Negative for palpitations.  Gastrointestinal: Negative.  Negative for heartburn.  Endocrine: Negative.   Genitourinary: Negative.  Negative for impotence.  Musculoskeletal: Negative.   Neurological: Negative.  Negative for headaches.  Hematological: Negative.   Psychiatric/Behavioral: Negative for confusion and decreased concentration. The patient is nervous/anxious.   All other systems reviewed and are negative.      Objective:   Physical Exam  Constitutional: She is oriented to person, place, and time. She appears well-developed and well-nourished. No distress.  HENT:  Head: Normocephalic and atraumatic.    Right Ear: External ear normal.  Left Ear: External ear normal.  Nose: Nose normal.  Mouth/Throat: Oropharynx is clear and moist.  Eyes: Pupils are equal, round, and reactive to light.  Neck: Normal range of motion. Neck supple. No thyromegaly present.  Cardiovascular: Normal rate, regular rhythm, normal heart sounds and intact distal pulses.   No murmur heard. Pulmonary/Chest: Effort normal and breath sounds normal. No respiratory distress. She has no wheezes.  Abdominal: Soft. Bowel sounds are normal. She exhibits no distension. There is no tenderness.  Musculoskeletal: Normal range of motion. She exhibits no edema or tenderness.  Neurological: She is alert and oriented to person, place, and time. She has normal reflexes. No cranial nerve deficit.  Skin: Skin is warm and dry.  Psychiatric: She has a normal mood and affect. Her behavior is normal. Judgment and thought content normal.  Vitals reviewed.   BP 120/86 mmHg  Pulse 94  Temp(Src) 97 F (36.1 C) (Oral)  Ht 5' 6"  (1.676 m)  Wt 199 lb 6.4 oz (90.447 kg)  BMI 32.20 kg/m2  LMP 01/12/2014       Assessment & Plan:  1. Anxiety - CMP14+EGFR - ALPRAZolam (XANAX) 0.5 MG tablet; One to two po bid prn anxiety  Dispense: 60 tablet; Refill: 3  2. Vitamin D deficiency - CMP14+EGFR - Vit D  25 hydroxy (rtn osteoporosis monitoring)  3. Gastroesophageal reflux disease, esophagitis presence not specified - CMP14+EGFR  4. Annual physical exam - CMP14+EGFR - Lipid panel - Thyroid Panel With TSH  5. Vitamin B 12 deficiency - CMP14+EGFR - Vitamin B12  6. Toenail fungus -Information given to prevent reoccurrence  - terbinafine (LAMISIL) 250 MG tablet; Take 1 tablet (250 mg total) by  mouth daily.  Dispense: 90 tablet; Refill: 0   Continue all meds Labs pending Health Maintenance reviewed- Flu shot given today Diet and exercise encouraged RTO 1 year  [name], FNP

## 2014-02-19 NOTE — Patient Instructions (Addendum)
Health Maintenance Adopting a healthy lifestyle and getting preventive care can go a long way to promote health and wellness. Talk with your health care provider about what schedule of regular examinations is right for you. This is a good chance for you to check in with your provider about disease prevention and staying healthy. In between checkups, there are plenty of things you can do on your own. Experts have done a lot of research about which lifestyle changes and preventive measures are most likely to keep you healthy. Ask your health care provider for more information. WEIGHT AND DIET  Eat a healthy diet 1. Be sure to include plenty of vegetables, fruits, low-fat dairy products, and lean protein. 2. Do not eat a lot of foods high in solid fats, added sugars, or salt. 3. Get regular exercise. This is one of the most important things you can do for your health. 1. Most adults should exercise for at least 150 minutes each week. The exercise should increase your heart rate and make you sweat (moderate-intensity exercise). 2. Most adults should also do strengthening exercises at least twice a week. This is in addition to the moderate-intensity exercise.  Maintain a healthy weight 1. Body mass index (BMI) is a measurement that can be used to identify possible weight problems. It estimates body fat based on height and weight. Your health care provider can help determine your BMI and help you achieve or maintain a healthy weight. 2. For females 72 years of age and older:  1. A BMI below 18.5 is considered underweight. 2. A BMI of 18.5 to 24.9 is normal. 3. A BMI of 25 to 29.9 is considered overweight. 4. A BMI of 30 and above is considered obese.  Watch levels of cholesterol and blood lipids 1. You should start having your blood tested for lipids and cholesterol at 42 years of age, then have this test every 5 years. 2. You may need to have your cholesterol levels checked more often if: 1. Your  lipid or cholesterol levels are high. 2. You are older than 42 years of age. 3. You are at high risk for heart disease.  CANCER SCREENING   Lung Cancer 1. Lung cancer screening is recommended for adults 50-11 years old who are at high risk for lung cancer because of a history of smoking. 2. A yearly low-dose CT scan of the lungs is recommended for people who: 1. Currently smoke. 2. Have quit within the past 15 years. 3. Have at least a 30-pack-year history of smoking. A pack year is smoking an average of one pack of cigarettes a day for 1 year. 3. Yearly screening should continue until it has been 15 years since you quit. 4. Yearly screening should stop if you develop a health problem that would prevent you from having lung cancer treatment.  Breast Cancer 1. Practice breast self-awareness. This means understanding how your breasts normally appear and feel. 2. It also means doing regular breast self-exams. Let your health care provider know about any changes, no matter how small. 3. If you are in your 20s or 30s, you should have a clinical breast exam (CBE) by a health care provider every 1-3 years as part of a regular health exam. 4. If you are 40 or older, have a CBE every year. Also consider having a breast X-ray (mammogram) every year. 5. If you have a family history of breast cancer, talk to your health care provider about genetic screening. 6. If you are  at high risk for breast cancer, talk to your health care provider about having an MRI and a mammogram every year. 7. Breast cancer gene (BRCA) assessment is recommended for women who have family members with BRCA-related cancers. BRCA-related cancers include: 1. Breast. 2. Ovarian. 3. Tubal. 4. Peritoneal cancers. 8. Results of the assessment will determine the need for genetic counseling and BRCA1 and BRCA2 testing. Cervical Cancer Routine pelvic examinations to screen for cervical cancer are no longer recommended for nonpregnant  women who are considered low risk for cancer of the pelvic organs (ovaries, uterus, and vagina) and who do not have symptoms. A pelvic examination may be necessary if you have symptoms including those associated with pelvic infections. Ask your health care provider if a screening pelvic exam is right for you.  1. The Pap test is the screening test for cervical cancer for women who are considered at risk. 1. If you had a hysterectomy for a problem that was not cancer or a condition that could lead to cancer, then you no longer need Pap tests. 2. If you are older than 65 years, and you have had normal Pap tests for the past 10 years, you no longer need to have Pap tests. 3. If you have had past treatment for cervical cancer or a condition that could lead to cancer, you need Pap tests and screening for cancer for at least 20 years after your treatment. 4. If you no longer get a Pap test, assess your risk factors if they change (such as having a new sexual partner). This can affect whether you should start being screened again. 5. Some women have medical problems that increase their chance of getting cervical cancer. If this is the case for you, your health care provider may recommend more frequent screening and Pap tests.  The human papillomavirus (HPV) test is another test that may be used for cervical cancer screening. The HPV test looks for the virus that can cause cell changes in the cervix. The cells collected during the Pap test can be tested for HPV.  The HPV test can be used to screen women 73 years of age and older. Getting tested for HPV can extend the interval between normal Pap tests from three to five years.  An HPV test also should be used to screen women of any age who have unclear Pap test results.  After 42 years of age, women should have HPV testing as often as Pap tests.  Colorectal Cancer  This type of cancer can be detected and often prevented.  Routine colorectal cancer  screening usually begins at 42 years of age and continues through 42 years of age.  Your health care provider may recommend screening at an earlier age if you have risk factors for colon cancer.  Your health care provider may also recommend using home test kits to check for hidden blood in the stool.  A small camera at the end of a tube can be used to examine your colon directly (sigmoidoscopy or colonoscopy). This is done to check for the earliest forms of colorectal cancer.  Routine screening usually begins at age 59.  Direct examination of the colon should be repeated every 5-10 years through 42 years of age. However, you may need to be screened more often if early forms of precancerous polyps or small growths are found. Skin Cancer  Check your skin from head to toe regularly.  Tell your health care provider about any new moles or changes in  moles, especially if there is a change in a mole's shape or color.  Also tell your health care provider if you have a mole that is larger than the size of a pencil eraser.  Always use sunscreen. Apply sunscreen liberally and repeatedly throughout the day.  Protect yourself by wearing long sleeves, pants, a wide-brimmed hat, and sunglasses whenever you are outside. HEART DISEASE, DIABETES, AND HIGH BLOOD PRESSURE   Have your blood pressure checked at least every 1-2 years. High blood pressure causes heart disease and increases the risk of stroke.  If you are between 45 years and 56 years old, ask your health care provider if you should take aspirin to prevent strokes.  Have regular diabetes screenings. This involves taking a blood sample to check your fasting blood sugar level.  If you are at a normal weight and have a low risk for diabetes, have this test once every three years after 42 years of age.  If you are overweight and have a high risk for diabetes, consider being tested at a younger age or more often. PREVENTING INFECTION  Hepatitis  B  If you have a higher risk for hepatitis B, you should be screened for this virus. You are considered at high risk for hepatitis B if:  You were born in a country where hepatitis B is common. Ask your health care provider which countries are considered high risk.  Your parents were born in a high-risk country, and you have not been immunized against hepatitis B (hepatitis B vaccine).  You have HIV or AIDS.  You use needles to inject street drugs.  You live with someone who has hepatitis B.  You have had sex with someone who has hepatitis B.  You get hemodialysis treatment.  You take certain medicines for conditions, including cancer, organ transplantation, and autoimmune conditions. Hepatitis C  Blood testing is recommended for:  Everyone born from [birth year] through 1963.  Anyone with known risk factors for hepatitis C. Sexually transmitted infections (STIs)  You should be screened for sexually transmitted infections (STIs) including gonorrhea and chlamydia if:  You are sexually active and are younger than 42 years of age.  You are older than 42 years of age and your health care provider tells you that you are at risk for this type of infection.  Your sexual activity has changed since you were last screened and you are at an increased risk for chlamydia or gonorrhea. Ask your health care provider if you are at risk.  If you do not have HIV, but are at risk, it may be recommended that you take a prescription medicine daily to prevent HIV infection. This is called pre-exposure prophylaxis (PrEP). You are considered at risk if:  You are sexually active and do not regularly use condoms or know the HIV status of your partner(s).  You take drugs by injection.  You are sexually active with a partner who has HIV. Talk with your health care provider about whether you are at high risk of being infected with HIV. If you choose to begin PrEP, you should first be tested for HIV. You should  then be tested every 3 months for as long as you are taking PrEP.  PREGNANCY   If you are premenopausal and you may become pregnant, ask your health care provider about preconception counseling.  If you may become pregnant, take 400 to 800 micrograms (mcg) of folic acid every day.  If you want to prevent pregnancy, talk to your  health care provider about birth control (contraception). OSTEOPOROSIS AND MENOPAUSE   Osteoporosis is a disease in which the bones lose minerals and strength with aging. This can result in serious bone fractures. Your risk for osteoporosis can be identified using a bone density scan.  If you are 83 years of age or older, or if you are at risk for osteoporosis and fractures, ask your health care provider if you should be screened.  Ask your health care provider whether you should take a calcium or vitamin D supplement to lower your risk for osteoporosis.  Menopause may have certain physical symptoms and risks.  Hormone replacement therapy may reduce some of these symptoms and risks. Talk to your health care provider about whether hormone replacement therapy is right for you.  HOME CARE INSTRUCTIONS   Schedule regular health, dental, and eye exams.  Stay current with your immunizations.   Do not use any tobacco products including cigarettes, chewing tobacco, or electronic cigarettes.  If you are pregnant, do not drink alcohol.  If you are breastfeeding, limit how much and how often you drink alcohol.  Limit alcohol intake to no more than 1 drink per day for nonpregnant women. One drink equals 12 ounces of beer, 5 ounces of wine, or 1 ounces of hard liquor.  Do not use street drugs.  Do not share needles.  Ask your health care provider for help if you need support or information about quitting drugs.  Tell your health care provider if you often feel depressed.  Tell your health care provider if you have ever been abused or do not feel safe at  home. Document Released: 07/19/2010 Document Revised: 05/20/2013 Document Reviewed: 12/05/2012 [facility] Patient Information 2015 [place], [place]. This information is not intended to replace advice given to you by your health care provider. Make sure you discuss any questions you have with your health care provider. Preventing Toenail Fungus from Recurring   Sanitize your shoes with Mycomist spray or a similar shoe sanitizer spray.  Follow the instructions on the bottle and dry them outside in the sun or with a hairdryer.  We also recommend repeating the sanitization once weekly in shoes you wear most often.   Throw away any shoes you have worn a significant amount without socks-fungus thrives in a warm moist environment and you want to avoid re-infection after your laser procedure   Bleach your socks with regular or color safe bleach   Change your socks regularly to keep your feet clean and dry (especially if you have sweaty feet)-if sweaty feet are a problem, let your doctor know-there is a great lotion that helps with this problem.   Clean your toenail clippers with alcohol before you use them if you do your own toenails and make sure to replace [employer] boards and orange sticks regularly   If you get regular pedicures, bring your own instruments or go to a spa that sterilizes their instruments in an autoclave.

## 2014-02-20 LAB — CMP14+EGFR
ALK PHOS: 98 IU/L (ref 39–117)
ALT: 26 IU/L (ref 0–32)
AST: 21 IU/L (ref 0–40)
Albumin/Globulin Ratio: 1.7 (ref 1.1–2.5)
Albumin: 4 g/dL (ref 3.5–5.5)
BUN/Creatinine Ratio: 7 — ABNORMAL LOW (ref 9–23)
BUN: 5 mg/dL — ABNORMAL LOW (ref 6–24)
CO2: 23 mmol/L (ref 18–29)
CREATININE: 0.72 mg/dL (ref 0.57–1.00)
Calcium: 8.5 mg/dL — ABNORMAL LOW (ref 8.7–10.2)
Chloride: 104 mmol/L (ref 97–108)
GFR, EST AFRICAN AMERICAN: 120 mL/min/{1.73_m2} (ref >59)
GFR, EST NON AFRICAN AMERICAN: 104 mL/min/{1.73_m2} (ref >59)
Globulin, Total: 2.4 g/dL (ref 1.5–4.5)
Glucose: 82 mg/dL (ref 65–99)
Potassium: 4.2 mmol/L (ref 3.5–5.2)
Sodium: 143 mmol/L (ref 134–144)
Total Bilirubin: <0.2 mg/dL (ref 0.0–1.2)
Total Protein: 6.4 g/dL (ref 6.0–8.5)

## 2014-02-20 LAB — LIPID PANEL
CHOL/HDL RATIO: 5.1 ratio — AB (ref 0.0–4.4)
CHOLESTEROL TOTAL: 219 mg/dL — AB (ref 100–199)
HDL: 43 mg/dL (ref >39)
LDL CALC: 148 mg/dL — AB (ref 0–99)
Triglycerides: 140 mg/dL (ref 0–149)
VLDL Cholesterol Cal: 28 mg/dL (ref 5–40)

## 2014-02-20 LAB — THYROID PANEL WITH TSH
Free Thyroxine Index: 2.2 (ref 1.2–4.9)
T3 UPTAKE RATIO: 25 % (ref 24–39)
T4 TOTAL: 8.9 ug/dL (ref 4.5–12.0)
TSH: 3.18 u[IU]/mL (ref 0.450–4.500)

## 2014-02-20 LAB — VITAMIN B12: Vitamin B-12: >2000 pg/mL — ABNORMAL HIGH (ref 211–946)

## 2014-02-20 LAB — VITAMIN D 25 HYDROXY (VIT D DEFICIENCY, FRACTURES): Vit D, 25-Hydroxy: 16.7 ng/mL — ABNORMAL LOW (ref 30.0–100.0)

## 2014-02-21 ENCOUNTER — Telehealth: Payer: Self-pay | Admitting: Family

## 2014-02-21 ENCOUNTER — Other Ambulatory Visit: Payer: Self-pay | Admitting: Family

## 2014-02-21 ENCOUNTER — Other Ambulatory Visit: Payer: Self-pay | Admitting: Family Medicine

## 2014-02-21 MED ORDER — VITAMIN D (ERGOCALCIFEROL) 1.25 MG (50000 UNIT) PO CAPS: 50000.0000 [IU] | ORAL_CAPSULE | ORAL | Status: DC

## 2014-02-21 MED ORDER — PRAVASTATIN SODIUM 40 MG PO TABS: 40.0000 mg | ORAL_TABLET | Freq: Every day | ORAL | Status: DC

## 2014-02-22 NOTE — Telephone Encounter (Signed)
Pt came into office and requested diet info for hyperlipidemia Info given to pt

## 2014-02-25 ENCOUNTER — Telehealth: Payer: Self-pay | Admitting: Family

## 2014-02-25 NOTE — Telephone Encounter (Signed)
Patient aware that she does not need them right now.

## 2014-03-12 ENCOUNTER — Other Ambulatory Visit: Payer: Self-pay | Admitting: Family Medicine

## 2014-03-14 NOTE — Telephone Encounter (Signed)
Prescription sent to pharmacy.

## 2014-03-17 ENCOUNTER — Other Ambulatory Visit: Payer: Self-pay | Admitting: Family

## 2014-03-17 DIAGNOSIS — B351 Tinea unguium: Secondary | ICD-10-CM

## 2014-03-19 MED ORDER — TERBINAFINE HCL 250 MG PO TABS: 250.0000 mg | ORAL_TABLET | Freq: Every day | ORAL | Status: DC

## 2014-03-21 ENCOUNTER — Ambulatory Visit: Payer: 59

## 2014-04-09 ENCOUNTER — Encounter: Payer: Self-pay | Admitting: Family

## 2014-04-09 ENCOUNTER — Other Ambulatory Visit (HOSPITAL_COMMUNITY): Payer: PRIVATE HEALTH INSURANCE

## 2014-04-09 ENCOUNTER — Ambulatory Visit (INDEPENDENT_AMBULATORY_CARE_PROVIDER_SITE_OTHER): Payer: 59 | Admitting: Family

## 2014-04-09 ENCOUNTER — Ambulatory Visit (HOSPITAL_COMMUNITY)
Admission: RE | Admit: 2014-04-09 | Discharge: 2014-04-09 | Disposition: A | Discharge status: Home / Self Care | Payer: 59 | Source: Ambulatory Visit | Attending: Family | Admitting: Family

## 2014-04-09 VITALS — BP 120/82 | HR 87 | Temp 97.1°F | Ht 66.0 in | Wt 198.6 lb

## 2014-04-09 DIAGNOSIS — R1031 Right lower quadrant pain: Secondary | ICD-10-CM | POA: Diagnosis not present

## 2014-04-09 DIAGNOSIS — N83201 Unspecified ovarian cyst, right side: Secondary | ICD-10-CM

## 2014-04-09 DIAGNOSIS — R938 Abnormal findings on diagnostic imaging of other specified body structures: Secondary | ICD-10-CM | POA: Diagnosis not present

## 2014-04-09 DIAGNOSIS — N8320 Unspecified ovarian cysts: Secondary | ICD-10-CM | POA: Diagnosis not present

## 2014-04-09 LAB — POCT CBC
Granulocyte percent: 65.8 %G (ref 37–80)
HCT, POC: 42.4 % (ref 37.7–47.9)
HEMOGLOBIN: 13.4 g/dL (ref 12.2–16.2)
Lymph, poc: 1.9 (ref 0.6–3.4)
MCH, POC: 27.9 pg (ref 27–31.2)
MCHC: 31.7 g/dL — AB (ref 31.8–35.4)
MCV: 88.2 fL (ref 80–97)
MPV: 8.6 fL (ref 0–99.8)
PLATELET COUNT, POC: 325 10*3/uL (ref 142–424)
POC Granulocyte: 5.1 (ref 2–6.9)
POC LYMPH %: 24.7 % (ref 10–50)
RBC: 4.81 M/uL (ref 4.04–5.48)
RDW, POC: 13.8 %
WBC: 7.7 10*3/uL (ref 4.6–10.2)

## 2014-04-09 LAB — POCT URINALYSIS DIPSTICK
Bilirubin, UA: NEGATIVE
Glucose, UA: NEGATIVE
Ketones, UA: NEGATIVE
Leukocytes, UA: NEGATIVE
Nitrite, UA: NEGATIVE
Protein, UA: NEGATIVE
Spec Grav, UA: 1.02
UROBILINOGEN UA: NEGATIVE
pH, UA: 6

## 2014-04-09 LAB — POCT UA - MICROSCOPIC ONLY
CASTS, UR, LPF, POC: NEGATIVE
Crystals, Ur, HPF, POC: NEGATIVE
Mucus, UA: NEGATIVE
Yeast, UA: NEGATIVE

## 2014-04-09 LAB — POCT WET PREP (WET MOUNT)

## 2014-04-09 MED ORDER — TRAMADOL HCL 50 MG PO TABS: 50.0000 mg | ORAL_TABLET | Freq: Three times a day (TID) | ORAL | Status: DC | PRN

## 2014-04-09 NOTE — Progress Notes (Signed)
   Subjective:    Patient ID: Kathy Ewing, female    DOB: Jul 04, 1970, 44 y.o.   MRN: [id number]  Vaginal Discharge The patient's primary symptoms include a genital odor and vaginal discharge. The patient's pertinent negatives include no genital rash. This is a new problem. The current episode started yesterday. The problem occurs constantly. The problem has been gradually worsening. The pain is moderate. The problem affects the right side. Associated symptoms include abdominal pain (RUQ and RLQ) and flank pain. Pertinent negatives include no back pain, chills, constipation, frequency, headaches, hematuria, nausea or vomiting. The vaginal discharge was white and yellow. Her past medical history is significant for ovarian cysts.  Abdominal Pain Pertinent negatives include no constipation, frequency, headaches, hematuria, nausea or vomiting.      Review of Systems  Constitutional: Negative.  Negative for chills.  HENT: Negative.   Eyes: Negative.   Respiratory: Negative.  Negative for shortness of breath.   Cardiovascular: Negative.  Negative for palpitations.  Gastrointestinal: Positive for abdominal pain (RUQ and RLQ). Negative for nausea, vomiting and constipation.  Endocrine: Negative.   Genitourinary: Positive for flank pain and vaginal discharge. Negative for frequency and hematuria.  Musculoskeletal: Negative.  Negative for back pain.  Neurological: Negative.  Negative for headaches.  Hematological: Negative.   Psychiatric/Behavioral: Negative.   All other systems reviewed and are negative.      Objective:   Physical Exam  Constitutional: She is oriented to person, place, and time. She appears well-developed and well-nourished. No distress.  HENT:  Head: Normocephalic and atraumatic.  Right Ear: External ear normal.  Mouth/Throat: Oropharynx is clear and moist.  Eyes: Pupils are equal, round, and reactive to light.  Neck: Normal range of motion. Neck supple. No thyromegaly  present.  Cardiovascular: Normal rate, regular rhythm, normal heart sounds and intact distal pulses.   No murmur heard. Pulmonary/Chest: Effort normal and breath sounds normal. No respiratory distress. She has no wheezes.  Abdominal: Soft. Bowel sounds are normal. She exhibits no distension. There is tenderness (RLQ). There is rebound.  Musculoskeletal: Normal range of motion. She exhibits no edema or tenderness.  Neurological: She is alert and oriented to person, place, and time. She has normal reflexes. No cranial nerve deficit.  Skin: Skin is warm and dry.  Psychiatric: She has a normal mood and affect. Her behavior is normal. Judgment and thought content normal.  Vitals reviewed.    BP 120/82 mmHg  Pulse 87  Temp(Src) 97.1 F (36.2 C) (Oral)  Ht 5\' 6"  (1.676 m)  Wt 198 lb 9.6 oz (90.084 kg)  BMI 32.07 kg/m2  LMP 03/24/2014      Assessment & Plan:  1. Right lower quadrant abdominal pain - POCT urinalysis dipstick - POCT UA - Microscopic Only - POCT Wet Prep ([place]et [place]) - POCT CBC - US Transvaginal Non-OB; Future  2. Cyst of right ovary -Will get scheduled today -Keep drinking and keep bladder full - US Transvaginal Non-OB; Future  [name], FNP

## 2014-04-09 NOTE — Patient Instructions (Addendum)
Abdominal Pain  Many things can cause abdominal pain. Usually, abdominal pain is not caused by a disease and will improve without treatment. It can often be observed and treated at home. Your health care provider will do a physical exam and possibly order blood tests and X-rays to help determine the seriousness of your pain. However, in many cases, more time must pass before a clear cause of the pain can be found. Before that point, your health care provider may not know if you need more testing or further treatment.  HOME CARE INSTRUCTIONS   Monitor your abdominal pain for any changes. The following actions may help to alleviate any discomfort you are experiencing:  · Only take over-the-counter or prescription medicines as directed by your health care provider.  · Do not take laxatives unless directed to do so by your health care provider.  · Try a clear liquid diet (broth, tea, or water) as directed by your health care provider. Slowly move to a bland diet as tolerated.  SEEK MEDICAL CARE IF:  · You have unexplained abdominal pain.  · You have abdominal pain associated with nausea or diarrhea.  · You have pain when you urinate or have a bowel movement.  · You experience abdominal pain that wakes you in the night.  · You have abdominal pain that is worsened or improved by eating food.  · You have abdominal pain that is worsened with eating fatty foods.  · You have a fever.  SEEK IMMEDIATE MEDICAL CARE IF:   · Your pain does not go away within 2 hours.  · You keep throwing up (vomiting).  · Your pain is felt only in portions of the abdomen, such as the right side or the left lower portion of the abdomen.  · You pass bloody or black tarry stools.  MAKE SURE YOU:  · Understand these instructions.    · Will watch your condition.    · Will get help right away if you are not doing well or get worse.    Document Released: 10/13/2004 Document Revised: 01/08/2013 Document Reviewed: 09/12/2012  Exit[employer]® Patient Information  ©2015 [employer]. This information is not intended to replace advice given to you by your health care provider. Make sure you discuss any questions you have with your health care provider.        Ovarian Cyst  An ovarian cyst is a fluid-filled sac that forms on an ovary. The ovaries are small organs that produce eggs in women. Various types of cysts can form on the ovaries. Most are not cancerous. Many do not cause problems, and they often go away on their own. Some may cause symptoms and require treatment. Common types of ovarian cysts include:  · Functional cysts--These cysts may occur every month during the menstrual cycle. This is normal. The cysts usually go away with the next menstrual cycle if the woman does not get pregnant. Usually, there are no symptoms with a functional cyst.  · Endometrioma cysts--These cysts form from the tissue that lines the uterus. They are also called "chocolate cysts" because they become filled with blood that turns brown. This type of cyst can cause pain in the lower abdomen during intercourse and with your menstrual period.  · Cystadenoma cysts--This type develops from the cells on the outside of the ovary. These cysts can get very big and cause lower abdomen pain and pain with intercourse. This type of cyst can twist on itself, cut off its blood supply, and cause severe   pain. It can also easily rupture and cause a lot of pain.  · Dermoid cysts--This type of cyst is sometimes found in both ovaries. These cysts may contain different kinds of body tissue, such as skin, teeth, hair, or cartilage. They usually do not cause symptoms unless they get very big.  · Theca lutein cysts--These cysts occur when too much of a certain hormone (human chorionic gonadotropin) is produced and overstimulates the ovaries to produce an egg. This is most common after procedures used to assist with the conception of a baby (in vitro fertilization).  CAUSES   · Fertility drugs can cause a condition in  which multiple large cysts are formed on the ovaries. This is called ovarian hyperstimulation syndrome.  · A condition called polycystic ovary syndrome can cause hormonal imbalances that can lead to nonfunctional ovarian cysts.  SIGNS AND SYMPTOMS   Many ovarian cysts do not cause symptoms. If symptoms are present, they may include:  · Pelvic pain or pressure.  · Pain in the lower abdomen.  · Pain during sexual intercourse.  · Increasing girth (swelling) of the abdomen.  · Abnormal menstrual periods.  · Increasing pain with menstrual periods.  · Stopping having menstrual periods without being pregnant.  DIAGNOSIS   These cysts are commonly found during a routine or annual pelvic exam. Tests may be ordered to find out more about the cyst. These tests may include:  · Ultrasound.  · X-ray of the pelvis.  · CT scan.  · MRI.  · Blood tests.  TREATMENT   Many ovarian cysts go away on their own without treatment. Your health care provider may want to check your cyst regularly for 2-3 months to see if it changes. For women in menopause, it is particularly important to monitor a cyst closely because of the higher rate of ovarian cancer in menopausal women. When treatment is needed, it may include any of the following:  · A procedure to drain the cyst (aspiration). This may be done using a long needle and ultrasound. It can also be done through a laparoscopic procedure. This involves using a thin, lighted tube with a tiny camera on the end (laparoscope) inserted through a small incision.  · Surgery to remove the whole cyst. This may be done using laparoscopic surgery or an open surgery involving a larger incision in the lower abdomen.  · Hormone treatment or birth control pills. These methods are sometimes used to help dissolve a cyst.  HOME CARE INSTRUCTIONS   · Only take over-the-counter or prescription medicines as directed by your health care provider.  · Follow up with your health care provider as directed.  · Get  regular pelvic exams and Pap tests.  SEEK MEDICAL CARE IF:   · Your periods are late, irregular, or painful, or they stop.  · Your pelvic pain or abdominal pain does not go away.  · Your abdomen becomes larger or swollen.  · You have pressure on your bladder or trouble emptying your bladder completely.  · You have pain during sexual intercourse.  · You have feelings of fullness, pressure, or discomfort in your stomach.  · You lose weight for no apparent reason.  · You feel generally ill.  · You become constipated.  · You lose your appetite.  · You develop acne.  · You have an increase in body and facial hair.  · You are gaining weight, without changing your exercise and eating habits.  · You think you are pregnant.    SEEK IMMEDIATE MEDICAL CARE IF:   · You have increasing abdominal pain.  · You feel sick to your stomach (nauseous), and you throw up (vomit).  · You develop a fever that comes on suddenly.  · You have abdominal pain during a bowel movement.  · Your menstrual periods become heavier than usual.  MAKE SURE YOU:  · Understand these instructions.  · Will watch your condition.  · Will get help right away if you are not doing well or get worse.  Document Released: 01/03/2005 Document Revised: 01/08/2013 Document Reviewed: 09/10/2012  Exit[employer]® Patient Information ©2015 [employer]. This information is not intended to replace advice given to you by your health care provider. Make sure you discuss any questions you have with your health care provider.

## 2014-04-09 NOTE — Addendum Note (Signed)
Addended by: [name] A on: 04/09/2014 01:42 PM   Modules accepted: Orders

## 2014-04-10 ENCOUNTER — Telehealth: Payer: Self-pay | Admitting: Family

## 2014-04-10 NOTE — Telephone Encounter (Signed)
This patient should see the gynecologist today on an urgent basis because of the increasing intensity of pain and the abnormal findings from the ultrasound.

## 2014-04-10 NOTE — Telephone Encounter (Signed)
Patient aware and states that she is going out of town and will see one next week when she gets home

## 2014-04-10 NOTE — Telephone Encounter (Signed)
Patient had ultrasound done yesterday and patient states that the tramadol is not touching the pain. She states that the pain has not got worse but that the pain is not improving.

## 2014-04-10 NOTE — Telephone Encounter (Signed)
Please make sure that ultrasound is scheduled urgently with the weekend coming up in her having increased abdominal pain--- this will enable [place] to see a specialist if necessary I would not change the pain medicine at this point, we just need to get the ultrasound done a lot sooner.

## 2014-04-10 NOTE — Telephone Encounter (Signed)
Please get a report of the ultrasound so I can decide if we need to send this patient to his specialists are not, bring this to me and we will discuss what we need to do

## 2014-04-10 NOTE — Telephone Encounter (Signed)
Patient states the tramadol is not helping her pain and wants to know if it can be changed. Will you please review since Alyse Low is off.

## 2014-04-21 ENCOUNTER — Encounter: Payer: Self-pay | Admitting: Family

## 2014-04-21 ENCOUNTER — Ambulatory Visit (INDEPENDENT_AMBULATORY_CARE_PROVIDER_SITE_OTHER): Payer: 59 | Admitting: Family

## 2014-04-21 ENCOUNTER — Ambulatory Visit (INDEPENDENT_AMBULATORY_CARE_PROVIDER_SITE_OTHER): Payer: 59 | Admitting: Neurology

## 2014-04-21 ENCOUNTER — Encounter: Payer: Self-pay | Admitting: Neurology

## 2014-04-21 ENCOUNTER — Encounter: Payer: Self-pay | Admitting: *Deleted

## 2014-04-21 VITALS — BP 120/68 | HR 99 | Ht 66.0 in | Wt 200.7 lb

## 2014-04-21 VITALS — BP 119/85 | HR 89 | Temp 97.5°F | Ht 66.0 in | Wt 199.6 lb

## 2014-04-21 DIAGNOSIS — G43109 Migraine with aura, not intractable, without status migrainosus: Secondary | ICD-10-CM | POA: Diagnosis not present

## 2014-04-21 DIAGNOSIS — R1084 Generalized abdominal pain: Secondary | ICD-10-CM | POA: Diagnosis not present

## 2014-04-21 DIAGNOSIS — R519 Headache, unspecified: Secondary | ICD-10-CM

## 2014-04-21 DIAGNOSIS — R51 Headache: Secondary | ICD-10-CM | POA: Diagnosis not present

## 2014-04-21 NOTE — Patient Instructions (Signed)
Endometriosis Endometriosis is a condition in which the tissue that lines the uterus (endometrium) grows outside of its normal location. The tissue may grow in many locations close to the uterus, but it commonly grows on the ovaries, fallopian tubes, vagina, or bowel. Because the uterus expels, or sheds, its lining every menstrual cycle, there is bleeding wherever the endometrial tissue is located. This can cause pain because blood is irritating to tissues not normally exposed to it.  CAUSES  The cause of endometriosis is not known.  SIGNS AND SYMPTOMS  Often, there are no symptoms. When symptoms are present, they can vary with the location of the displaced tissue. Various symptoms can occur at different times. Although symptoms occur mainly during a woman's menstrual period, they can also occur midcycle and usually stop with menopause. Some people may go months with no symptoms at all. Symptoms may include:   Back or abdominal pain.   Heavier bleeding during periods.   Pain during intercourse.   Painful bowel movements.   Infertility. DIAGNOSIS  Your health care provider will do a physical exam and ask about your symptoms. Various tests may be done, such as:   Blood tests and urine tests. These are done to help rule out other problems.   Ultrasound. This test is done to look for abnormal tissue.   An X-ray of the lower bowel (barium enema).  Laparoscopy. In this procedure, a thin, lighted tube with a tiny camera on the end (laparoscope) is inserted into your abdomen. This helps your health care provider look for abnormal tissue to confirm the diagnosis. The health care provider may also remove a small piece of tissue (biopsy) from any abnormal tissue found. This tissue sample can then be sent to a lab so it can be looked at under a microscope. TREATMENT  Treatment will vary and may include:   Medicines to relieve pain. Nonsteroidal anti-inflammatory drugs (NSAIDs) are a type of  pain medicine that can help to relieve the pain caused by endometriosis.  Hormonal therapy. When using hormonal therapy, periods are eliminated. This eliminates the monthly exposure to blood by the displaced endometrial tissue.   Surgery. Surgery may sometimes be done to remove the abnormal endometrial tissue. In severe cases, surgery may be done to remove the fallopian tubes, uterus, and ovaries (hysterectomy). HOME CARE INSTRUCTIONS   Take all medicines as directed by your health care provider. Do not take aspirin because it may increase bleeding when you are not on hormonal therapy.   Avoid activities that produce pain, including sexual activity. SEEK MEDICAL CARE IF:  You have pelvic pain before, after, or during your periods.  You have pelvic pain between periods that gets worse during your period.  You have pelvic pain during or after sex.  You have pelvic pain with bowel movements or urination, especially during your period.  You have problems getting pregnant.  You have a fever. SEEK IMMEDIATE MEDICAL CARE IF:   Your pain is severe and is not responding to pain medicine.   You have severe nausea and vomiting, or you cannot keep foods down.   You have pain that is limited to the right lower part of your abdomen.   You have swelling or increasing pain in your abdomen.   You see blood in your stool.  MAKE SURE YOU:   Understand these instructions.  Will watch your condition.  Will get help right away if you are not doing well or get worse. Document Released: 01/01/2000 Document  Revised: 05/20/2013 Document Reviewed: 08/31/2012 [facility] Patient Information 2015 [place], [place]. This information is not intended to replace advice given to you by your health care provider. Make sure you discuss any questions you have with your health care provider.

## 2014-04-21 NOTE — Progress Notes (Signed)
   Subjective:    Patient ID: Kathy Ewing, female    DOB: 06-23-1970, 44 y.o.   MRN: [id number]  HPI Pt presents to the office today to discuss lab work and discuss the transvaginal ultrasound. Pt was told to follow-up the week immediately following menses for another transvaginal ultrasound to look at the endometrial thickness. Pt states she started menses on Saturday and usually bleeds for one week. Pt states she is still having generalized lower abd pain. Pt reports her menses is very heavy.    Review of Systems  Constitutional: Negative.   HENT: Negative.   Eyes: Negative.   Respiratory: Negative.  Negative for shortness of breath.   Cardiovascular: Negative.  Negative for palpitations.  Gastrointestinal: Negative.   Endocrine: Negative.   Genitourinary: Negative.   Musculoskeletal: Negative.   Neurological: Negative.  Negative for headaches.  Hematological: Negative.   Psychiatric/Behavioral: Negative.   All other systems reviewed and are negative.      Objective:   Physical Exam  Constitutional: She is oriented to person, place, and time. She appears well-developed and well-nourished. No distress.  HENT:  Head: Atraumatic.  Eyes: Pupils are equal, round, and reactive to light.  Neck: Normal range of motion. Neck supple. No thyromegaly present.  Cardiovascular: Normal rate, regular rhythm, normal heart sounds and intact distal pulses.   No murmur heard. Pulmonary/Chest: Effort normal and breath sounds normal. No respiratory distress. She has no wheezes.  Abdominal: Soft. Bowel sounds are normal. She exhibits no distension. There is no tenderness.  Musculoskeletal: Normal range of motion. She exhibits no edema or tenderness.  Neurological: She is alert and oriented to person, place, and time. She has normal reflexes. No cranial nerve deficit.  Skin: Skin is warm and dry.  Psychiatric: She has a normal mood and affect. Her behavior is normal. Judgment and thought content  normal.  Vitals reviewed.    BP 119/85 mmHg  Pulse 89  Temp(Src) 97.5 F (36.4 C) (Oral)  Ht 5\' 6"  (1.676 m)  Wt 199 lb 9.6 oz (90.538 kg)  BMI 32.23 kg/m2  LMP 04/21/2014      Assessment & Plan:  1. Abdominal pain, generalized -Will call with appointment of ultrasound- Will schedule on Monday, 04/28/14 -Tylenol prn for pain -RTO 6 months - US Transvaginal Non-OB; Future - US Pelvis Complete; Future  [name], FNP

## 2014-04-21 NOTE — Progress Notes (Signed)
Follow-up Visit   Date: 04/21/2014    Kathy Ewing MRN: [id number] DOB: [birth year]   Interim History: Kathy Ewing is a 44 y.o. right-handed Caucasian female with GERD, vitamin B12 deficiency (Jan 2016), anxiety, tobacco use, and migraines returning to the clinic for follow-up of migraines.  The patient was accompanied to the clinic by self.  History of present illness: She started having holocephalic headaches 4098 associated with nausea, vomiting, photophobia. She complains of having a daily low-grade headache and severe migraine about twice per week. She has not been on a daily preventative medication. She has previously taken ibuprofen, tylenol, advil, and Aleve without any benefit. Imitrex eases her pain, but does not completely alleviate the pain.  She also complains of numbness/tingling involving her whole body (face, arm, and legs). She feels as if something is pricking her. She also feel generalized fatigue, low energy, and low mood. In September 2015, she reports that her sister with multiple sclerosis passed away of unknown etiology. Afterwards, her numbness tingling started and headaches have become worse. She was also recently found to have vitamin B 12 deficiency and has started B 12 injections in January 2016.  UPDATE 04/21/2014:  At her last visit, I started amitriptyline for her headaches/paresthesias which she increased to 20mg  and has noticed improvement in headaches and insomnia.  She is now having 1-4 headache days per week and had reduced her NSAIDs to once per week.  Her numbness/tingling and chest pain is also much better and says that she can go a week without having any spells.     Medications:  Current Outpatient Prescriptions on File Prior to Visit  Medication Sig Dispense Refill  . ALPRAZolam (XANAX) 0.5 MG tablet One to two po bid prn anxiety 60 tablet 3  . amitriptyline (ELAVIL) 10 MG tablet Start 1 tablet at bedtime x 2 week, then increase to 2  tablets at bedtime. 60 tablet 3  . butalbital-acetaminophen-caffeine (FIORICET) 50-325-40 MG per tablet Take 1-2 tablets by mouth every 6 (six) hours as needed for headache. 30 tablet 1  . cyanocobalamin (,VITAMIN B-12,) 1000 MCG/ML injection Inject one ml IM daily for a week then weekly for 4 weeks then monthly 10 mL 1  . naproxen (NAPROSYN) 500 MG tablet Take one tablet for severe headache, ok to repeat in 12 hours.  Do not take more than twice per week. 20 tablet 2  . omeprazole (PRILOSEC) 20 MG capsule Take 1 capsule (20 mg total) by mouth daily. 30 capsule 11  . pravastatin (PRAVACHOL) 40 MG tablet Take 1 tablet (40 mg total) by mouth daily. 90 tablet 3  . SUMAtriptan (IMITREX) 100 MG tablet TAKE ONE TABLET BY MOUTH AS NEEDED FOR MIGRAINE OR HEADACHE. MAY REPEAT IN 2 HOURS IF HEADACHE PERSISTS OR RECURS. 10 tablet 3  . terbinafine (LAMISIL) 250 MG tablet Take 1 tablet (250 mg total) by mouth daily. 90 tablet 0  . traMADol (ULTRAM) 50 MG tablet Take 1 tablet (50 mg total) by mouth every 8 (eight) hours as needed. 30 tablet 0  . Vitamin D, Ergocalciferol, (DRISDOL) 50000 UNITS CAPS capsule Take 1 capsule (50,000 Units total) by mouth every 7 (seven) days. 18 capsule 0  . promethazine (PHENERGAN) 25 MG tablet Take 1 tablet (25 mg total) by mouth every 6 (six) hours as needed for nausea. 30 tablet 0   Current Facility-Administered Medications on File Prior to Visit  Medication Dose Route Frequency Provider Last Rate Last Dose  . cyanocobalamin ((  VITAMIN B-12)) injection 1,000 mcg  1,000 mcg Intramuscular Q30 days Lysbeth Penner, FNP   1,000 mcg at 02/19/14 1035    Allergies: No Known Allergies  Review of Systems:  CONSTITUTIONAL: No fevers, chills, night sweats, or weight loss.  EYES: No visual changes or eye pain ENT: No hearing changes.  No history of nose bleeds.   RESPIRATORY: No cough, wheezing and shortness of breath.   CARDIOVASCULAR: Negative for chest pain, and palpitations.     GI: Negative for abdominal discomfort, blood in stools or black stools.  No recent change in bowel habits.   GU:  No history of incontinence.   MUSCLOSKELETAL: No history of joint pain or swelling.  No myalgias.   SKIN: Negative for lesions, rash, and itching.   ENDOCRINE: Negative for cold or heat intolerance, polydipsia or goiter.   PSYCH:  + depression or anxiety symptoms.   NEURO: As Above.   Vital Signs:  BP 120/68 mmHg  Pulse 99  Ht 5\' 6"  (1.676 m)  Wt 200 lb 11.2 oz (91.037 kg)  BMI 32.41 kg/m2  SpO2 97%  LMP 03/24/2014  Neurological Exam: MENTAL STATUS including orientation to time, place, person, recent and remote memory, attention span and concentration, language, and fund of knowledge is normal.  Speech is not dysarthric.  CRANIAL NERVES: No visual field defects.  Pupils equal round and reactive to light.  Normal conjugate, extra-ocular eye movements in all directions of gaze.  No ptosis.  Face is symmetric. Palate elevates symmetrically.  Tongue is midline.  MOTOR:  Motor strength is 5/5 in all extremities.  Tone is normal.    MSRs:  Reflexes are symmetric and brisk 3+/4 throughout, except 2+/4 at Achilles bilaterally.  COORDINATION/GAIT:  Gait narrow based and stable.   Data: Lab Results  Component Value Date   VITAMINB12 >2000* 02/19/2014     IMPRESSION: 1.  Chronic daily headaches, improving 2.  Migratory paresthesias, improving  - nonspecific ?due to vitamin B12 deficiency vs. migraine equivalent syndrome 3.  Episodic migraines    PLAN/RECOMMENDATIONS:  1. Increase amitriptyline 30mg  at bedtime for two weeks and uptitrate as needed.  She will call for Rx for 50-mg tablets, if needed 2. Continue vitamin B12 1094mcg weekly, followed by monthly injections 3. For moderate headaches, take naproxen 500mg  with benadryl 25mg  daily 4. For severe headache, ok to take imitrex 100mg  at headache onset 5. She will send MyChart message with updates in 3 months  and return to clinic in 6 months    The duration of this appointment visit was 25 minutes of face-to-face time with the patient.  Greater than 50% of this time was spent in counseling, explanation of diagnosis, planning of further management, and coordination of care.   Thank you for allowing me to participate in patient's care.  If I can answer any additional questions, I would be pleased to do so.    Sincerely,    Donika K. Posey Pronto, DO

## 2014-04-21 NOTE — Patient Instructions (Addendum)
1.  Start taking amitriptyline 30mg  (3 tablets) at bedtime for 2 weeks, then increase to 40mg  (4 tablets) for weeks. 2.  If you are doing okay on a lower dose, no need to increase. 3.  If you are still getting headaches, send me a MyChart message, and I will send a prescription for a 50mg  tablet. 4.  Return to clinic 35-months

## 2014-04-28 ENCOUNTER — Ambulatory Visit (HOSPITAL_COMMUNITY): Payer: 59

## 2014-04-28 ENCOUNTER — Ambulatory Visit (HOSPITAL_COMMUNITY)
Admission: RE | Admit: 2014-04-28 | Discharge: 2014-04-28 | Disposition: A | Discharge status: Home / Self Care | Payer: 59 | Source: Ambulatory Visit | Attending: Family | Admitting: Family

## 2014-04-28 DIAGNOSIS — N8320 Unspecified ovarian cysts: Secondary | ICD-10-CM | POA: Diagnosis not present

## 2014-04-28 DIAGNOSIS — R102 Pelvic and perineal pain: Secondary | ICD-10-CM | POA: Insufficient documentation

## 2014-04-28 DIAGNOSIS — R1084 Generalized abdominal pain: Secondary | ICD-10-CM

## 2014-04-29 ENCOUNTER — Other Ambulatory Visit: Payer: Self-pay | Admitting: Family

## 2014-04-29 DIAGNOSIS — D259 Leiomyoma of uterus, unspecified: Secondary | ICD-10-CM

## 2014-04-29 DIAGNOSIS — R1030 Lower abdominal pain, unspecified: Secondary | ICD-10-CM

## 2014-04-29 NOTE — Progress Notes (Signed)
Lmtcb/ww 4/12

## 2014-05-05 ENCOUNTER — Encounter: Payer: Self-pay | Admitting: Obstetrics and Gynecology

## 2014-05-05 ENCOUNTER — Ambulatory Visit (INDEPENDENT_AMBULATORY_CARE_PROVIDER_SITE_OTHER): Payer: 59 | Admitting: Obstetrics and Gynecology

## 2014-05-05 VITALS — BP 120/80 | Ht 66.0 in | Wt 202.0 lb

## 2014-05-05 DIAGNOSIS — N946 Dysmenorrhea, unspecified: Secondary | ICD-10-CM

## 2014-05-05 NOTE — Progress Notes (Signed)
Patient ID: Kathy Ewing, female   DOB: [birth year], 44 y.o.   MRN: [id number] Pt here today for pelvic pain, ovarian cyst, and endometriosis.   [facility] Visit  Patient name: Kathy Ewing MRN [id number]  Date of birth: 1970/11/02  CC & HPI:  Kathy Ewing is a 44 y.o. female presenting today for discus solution to heavy menses and pain. Had 2 u/s with thin endomet lining on second u/s ,one small fibroid seen  ROS:  Told she had endometriosis recently  Pertinent History Reviewed:   Reviewed: Significant for  Medical         Past Medical History  Diagnosis Date  . B12 deficiency   . Vitamin D deficiency   . Anxiety   . Headache   . Numbness                               Surgical Hx:    Past Surgical History  Procedure Laterality Date  . Cesarean section      x 2   Medications: Reviewed & Updated - see associated section                       Current outpatient prescriptions:  .  ALPRAZolam (XANAX) 0.5 MG tablet, One to two po bid prn anxiety, Disp: 60 tablet, Rfl: 3 .  amitriptyline (ELAVIL) 10 MG tablet, Start 1 tablet at bedtime x 2 week, then increase to 2 tablets at bedtime., Disp: 60 tablet, Rfl: 3 .  butalbital-acetaminophen-caffeine (FIORICET) 50-325-40 MG per tablet, Take 1-2 tablets by mouth every 6 (six) hours as needed for headache., Disp: 30 tablet, Rfl: 1 .  naproxen (NAPROSYN) 500 MG tablet, Take one tablet for severe headache, ok to repeat in 12 hours.  Do not take more than twice per week., Disp: 20 tablet, Rfl: 2 .  omeprazole (PRILOSEC) 20 MG capsule, Take 1 capsule (20 mg total) by mouth daily., Disp: 30 capsule, Rfl: 11 .  pravastatin (PRAVACHOL) 40 MG tablet, Take 1 tablet (40 mg total) by mouth daily., Disp: 90 tablet, Rfl: 3 .  promethazine (PHENERGAN) 25 MG tablet, Take 1 tablet (25 mg total) by mouth every 6 (six) hours as needed for nausea., Disp: 30 tablet, Rfl: 0 .  SUMAtriptan (IMITREX) 100 MG tablet, TAKE ONE TABLET BY MOUTH AS  NEEDED FOR MIGRAINE OR HEADACHE. MAY REPEAT IN 2 HOURS IF HEADACHE PERSISTS OR RECURS., Disp: 10 tablet, Rfl: 3 .  terbinafine (LAMISIL) 250 MG tablet, Take 1 tablet (250 mg total) by mouth daily., Disp: 90 tablet, Rfl: 0 .  traMADol (ULTRAM) 50 MG tablet, Take 1 tablet (50 mg total) by mouth every 8 (eight) hours as needed., Disp: 30 tablet, Rfl: 0 .  Vitamin D, Ergocalciferol, (DRISDOL) 50000 UNITS CAPS capsule, Take 1 capsule (50,000 Units total) by mouth every 7 (seven) days., Disp: 18 capsule, Rfl: 0   Social History: Reviewed -  reports that she has been smoking Cigarettes.  She has a 15 pack-year smoking history. She has never used smokeless tobacco.  Objective Findings:  Vitals: Blood pressure 120/80, height 5\' 6"  (1.676 m), weight 91.627 kg (202 lb), last menstrual period 04/21/2014.  Physical Examination: General appearance - alert, well appearing, and in no distress, oriented to person, place, and time and overweight Mental status - alert, oriented to person, place, and time, normal mood, behavior, speech, dress, motor activity, and thought  processes, affect appropriate to mood Abdomen - soft, nontender, nondistended, no masses or organomegaly splenomegaly no bladder distension noted Pelvic - normal external genitalia, vulva, vagina, cervix, uterus and adnexa, small cervix, anteflexed normal size. Uterus.   Assessment & Plan:  2 A:  1. Dysmenorrhea, ovarian cyst. Pelvic pain 2 I doubt endometriosis P:  1. Discussed endometrial ablation vs IUD for menstrual control. Pt to discuss with partner and let us know

## 2014-05-17 ENCOUNTER — Other Ambulatory Visit: Payer: Self-pay | Admitting: *Deleted

## 2014-05-17 NOTE — Telephone Encounter (Signed)
Last filled 04/09/14, last seen 05/01/14. Rx will print

## 2014-05-19 MED ORDER — TRAMADOL HCL 50 MG PO TABS: 50.0000 mg | ORAL_TABLET | Freq: Three times a day (TID) | ORAL | Status: DC | PRN

## 2014-05-19 NOTE — Telephone Encounter (Signed)
RX ready for pick up 

## 2014-05-19 NOTE — Telephone Encounter (Signed)
Patient aware rx up front to pick up 

## 2014-05-19 NOTE — Telephone Encounter (Signed)
Na/ww 5/2

## 2014-06-04 ENCOUNTER — Ambulatory Visit: Payer: 59 | Admitting: Obstetrics and Gynecology

## 2014-06-18 ENCOUNTER — Ambulatory Visit: Payer: 59 | Admitting: Obstetrics and Gynecology

## 2014-07-03 ENCOUNTER — Other Ambulatory Visit: Payer: Self-pay | Admitting: Family

## 2014-07-03 NOTE — Telephone Encounter (Signed)
Last seen 04/21/14 Kathy Ewing  If approved route to nurse to call into [facility]

## 2014-07-05 NOTE — Telephone Encounter (Signed)
Left authorization on pharmacy voicemail. 

## 2014-07-07 ENCOUNTER — Telehealth: Payer: Self-pay | Admitting: Family

## 2014-07-07 NOTE — Telephone Encounter (Signed)
This was refilled on June 17th. Can you make sure this was called it? Thanks

## 2014-07-07 NOTE — Telephone Encounter (Signed)
[facility]- they have xanax filled that was called in 07/04/14, but patient has not picked up yet.  Called patient to advise it is ready to be picked up, no answer, voicemail box full.

## 2014-07-07 NOTE — Telephone Encounter (Signed)
Patient aware rx at pharmacy 

## 2014-07-09 ENCOUNTER — Encounter: Payer: Self-pay | Admitting: Family Medicine

## 2014-07-09 ENCOUNTER — Ambulatory Visit (INDEPENDENT_AMBULATORY_CARE_PROVIDER_SITE_OTHER): Payer: 59 | Admitting: Family Medicine

## 2014-07-09 VITALS — BP 124/70 | HR 80 | Temp 97.2°F | Ht 66.0 in | Wt 201.6 lb

## 2014-07-09 DIAGNOSIS — G43011 Migraine without aura, intractable, with status migrainosus: Secondary | ICD-10-CM

## 2014-07-09 NOTE — Progress Notes (Signed)
Subjective:  Patient ID: Kathy Ewing, female    DOB: [birth year]  Age: 44 y.o. MRN: [id number]  CC: Headache and toe numbness   HPI Kathy Ewing presents for severe recurrent headache. Pain is 8/10. It radiates from the temples to the left posterior neck. Onset 2-3 days ago. Poor relief with naproxen. She has been out of her sumatriptan and promethazine for several weeks. There is a great deal of nausea associated. Patient had good relief with amitriptyline started by neurology several months ago. Unfortunately she ran out of the medicine while she was still titrating it and headaches have recurred.  History Kathy Ewing has a past medical history of B12 deficiency; Vitamin D deficiency; Anxiety; Headache; and Numbness.   She has past surgical history that includes Cesarean section.   Her family history includes Anxiety disorder in her brother; Cerebral palsy in her daughter; Depression in her daughter; Hypertension in her mother; Migraines in her sister; Multiple sclerosis in her sister.She reports that she has been smoking Cigarettes.  She has a 15 pack-year smoking history. She has never used smokeless tobacco. She reports that she does not drink alcohol or use illicit drugs.  Outpatient Prescriptions Prior to Visit  Medication Sig Dispense Refill  . ALPRAZolam (XANAX) 0.5 MG tablet TAKE 1 TO 2 TABLETS BY MOUTH TWICE DAILYAS NEEDED FOR ANXIETY 60 tablet 0  . butalbital-acetaminophen-caffeine (FIORICET) 50-325-40 MG per tablet Take 1-2 tablets by mouth every 6 (six) hours as needed for headache. 30 tablet 1  . naproxen (NAPROSYN) 500 MG tablet Take one tablet for severe headache, ok to repeat in 12 hours.  Do not take more than twice per week. 20 tablet 2  . omeprazole (PRILOSEC) 20 MG capsule Take 1 capsule (20 mg total) by mouth daily. 30 capsule 11  . pravastatin (PRAVACHOL) 40 MG tablet Take 1 tablet (40 mg total) by mouth daily. 90 tablet 3  . terbinafine (LAMISIL) 250 MG tablet Take  1 tablet (250 mg total) by mouth daily. 90 tablet 0  . traMADol (ULTRAM) 50 MG tablet Take 1 tablet (50 mg total) by mouth every 8 (eight) hours as needed. 30 tablet 0  . Vitamin D, Ergocalciferol, (DRISDOL) 50000 UNITS CAPS capsule Take 1 capsule (50,000 Units total) by mouth every 7 (seven) days. 18 capsule 0  . amitriptyline (ELAVIL) 10 MG tablet Start 1 tablet at bedtime x 2 week, then increase to 2 tablets at bedtime. 60 tablet 3  . SUMAtriptan (IMITREX) 100 MG tablet TAKE ONE TABLET BY MOUTH AS NEEDED FOR MIGRAINE OR HEADACHE. MAY REPEAT IN 2 HOURS IF HEADACHE PERSISTS OR RECURS. 10 tablet 3  . promethazine (PHENERGAN) 25 MG tablet Take 1 tablet (25 mg total) by mouth every 6 (six) hours as needed for nausea. 30 tablet 0   No facility-administered medications prior to visit.    ROS Review of Systems  Constitutional: Positive for fatigue. Negative for fever, chills, diaphoresis, appetite change and unexpected weight change.  HENT: Negative for congestion, ear pain, hearing loss, postnasal drip, rhinorrhea, sneezing, sore throat and trouble swallowing.   Eyes: Negative for pain.  Respiratory: Negative for cough, chest tightness and shortness of breath.   Cardiovascular: Negative for chest pain and palpitations.  Gastrointestinal: Negative for nausea, vomiting, abdominal pain, diarrhea and constipation.  Genitourinary: Negative for dysuria, frequency and menstrual problem.  Musculoskeletal: Positive for neck pain. Negative for joint swelling and arthralgias.  Skin: Negative for rash.  Neurological: Positive for weakness, light-headedness and headaches. Negative  for dizziness and numbness.  Psychiatric/Behavioral: Negative for dysphoric mood and agitation.    Objective:  BP 124/70 mmHg  Pulse 80  Temp(Src) 97.2 F (36.2 C) (Oral)  Ht 5\' 6"  (1.676 m)  Wt 201 lb 9.6 oz (91.445 kg)  BMI 32.55 kg/m2  LMP 06/18/2014  BP Readings from Last 3 Encounters:  07/09/14 124/70  05/05/14  120/80  04/21/14 119/85    Wt Readings from Last 3 Encounters:  07/09/14 201 lb 9.6 oz (91.445 kg)  05/05/14 202 lb (91.627 kg)  04/21/14 199 lb 9.6 oz (90.538 kg)     Physical Exam  Constitutional: She is oriented to person, place, and time. She appears well-developed and well-nourished. No distress.  HENT:  Head: Normocephalic and atraumatic.  Right Ear: External ear normal.  Left Ear: External ear normal.  Nose: Nose normal.  Mouth/Throat: Oropharynx is clear and moist.  Eyes: Conjunctivae and EOM are normal. Pupils are equal, round, and reactive to light.  Neck: Normal range of motion. Neck supple. No thyromegaly present.  Cardiovascular: Normal rate, regular rhythm and normal heart sounds.   No murmur heard. Pulmonary/Chest: Effort normal and breath sounds normal. No respiratory distress. She has no wheezes. She has no rales.  Abdominal: Soft. Bowel sounds are normal. She exhibits no distension. There is no tenderness.  Lymphadenopathy:    She has no cervical adenopathy.  Neurological: She is alert and oriented to person, place, and time. She has normal reflexes.  Skin: Skin is warm and dry.  Psychiatric: She has a normal mood and affect. Her behavior is normal. Judgment and thought content normal.    No results found for: HGBA1C  Lab Results  Component Value Date   WBC 7.7 04/09/2014   HGB 13.4 04/09/2014   HCT 42.4 04/09/2014   PLT 345 08/17/2010   GLUCOSE 82 02/19/2014   CHOL 219* 02/19/2014   TRIG 140 02/19/2014   HDL 43 02/19/2014   LDLCALC 148* 02/19/2014   ALT 26 02/19/2014   AST 21 02/19/2014   NA 143 02/19/2014   K 4.2 02/19/2014   CL 104 02/19/2014   CREATININE 0.72 02/19/2014   BUN 5* 02/19/2014   CO2 23 02/19/2014   TSH 3.180 02/19/2014    US Transvaginal Non-ob  04/28/2014   CLINICAL DATA:  Pelvic pain for 3 weeks, history of ovarian cysts and prior Caesarean sections  EXAM: TRANSABDOMINAL AND TRANSVAGINAL ULTRASOUND OF PELVIS  TECHNIQUE:  Both transabdominal and transvaginal ultrasound examinations of the pelvis were performed. Transabdominal technique was performed for global imaging of the pelvis including uterus, ovaries, adnexal regions, and pelvic cul-de-sac. It was necessary to proceed with endovaginal exam following the transabdominal exam to visualize the endometrium and ovaries. Images are not yet in PACs but are reviewed at the modality.  COMPARISON:  04/09/2014  FINDINGS: Uterus  Measurements: 9.8 x 4.0 x 5.3 cm. Nodule identified at RIGHT lateral aspect of uterine fundus likely representing a leiomyoma 1.9 x 2.2 x 1.6 cm. No additional uterine masses.  Endometrium  Thickness: 7 mm thick, normal. Normal tri-layered appearance without focal abnormality. Small amount of endocervical fluid noted.  Right ovary  Measurements: 3.4 x 2.0 x 2.0 cm. Dominant follicle 1.8 cm diameter. Otherwise normal appearance.  Left ovary  Measurements: 2.4 x 1.9 x 1.7 cm. Normal morphology without mass.  Other findings  Trace free pelvic fluid.  No other pelvic masses.  IMPRESSION: Probable small uterine leiomyoma at RIGHT fundus 1.9 x 2.2 x 1.6 cm.  Otherwise  normal exam.   Electronically Signed   By: Lavonia Dana M.D.   On: 04/28/2014 17:33   US Pelvis Complete  04/28/2014   CLINICAL DATA:  Pelvic pain for 3 weeks, history of ovarian cysts and prior Caesarean sections  EXAM: TRANSABDOMINAL AND TRANSVAGINAL ULTRASOUND OF PELVIS  TECHNIQUE: Both transabdominal and transvaginal ultrasound examinations of the pelvis were performed. Transabdominal technique was performed for global imaging of the pelvis including uterus, ovaries, adnexal regions, and pelvic cul-de-sac. It was necessary to proceed with endovaginal exam following the transabdominal exam to visualize the endometrium and ovaries. Images are not yet in PACs but are reviewed at the modality.  COMPARISON:  04/09/2014  FINDINGS: Uterus  Measurements: 9.8 x 4.0 x 5.3 cm. Nodule identified at RIGHT lateral  aspect of uterine fundus likely representing a leiomyoma 1.9 x 2.2 x 1.6 cm. No additional uterine masses.  Endometrium  Thickness: 7 mm thick, normal. Normal tri-layered appearance without focal abnormality. Small amount of endocervical fluid noted.  Right ovary  Measurements: 3.4 x 2.0 x 2.0 cm. Dominant follicle 1.8 cm diameter. Otherwise normal appearance.  Left ovary  Measurements: 2.4 x 1.9 x 1.7 cm. Normal morphology without mass.  Other findings  Trace free pelvic fluid.  No other pelvic masses.  IMPRESSION: Probable small uterine leiomyoma at RIGHT fundus 1.9 x 2.2 x 1.6 cm.  Otherwise normal exam.   Electronically Signed   By: Lavonia Dana M.D.   On: 04/28/2014 17:33    Assessment & Plan:   Kathy Ewing was seen today for headache and toe numbness.  Diagnoses and all orders for this visit:  Intractable migraine without aura and with status migrainosus  Other orders -     SUMAtriptan (IMITREX) 100 MG tablet; TAKE ONE TABLET BY MOUTH AS NEEDED FOR MIGRAINE OR HEADACHE. MAY REPEAT IN 2 HOURS IF HEADACHE PERSISTS OR RECURS. -     amitriptyline (ELAVIL) 10 MG tablet; 1 qhs X 3d, then 2 hs X3d, then 3 hs X 3d then 4 hs   I have changed Kathy Ewing's amitriptyline. I am also having her maintain her butalbital-acetaminophen-caffeine, omeprazole, naproxen, pravastatin, Vitamin D (Ergocalciferol), terbinafine, traMADol, ALPRAZolam, and SUMAtriptan.  Meds ordered this encounter  Medications  . SUMAtriptan (IMITREX) 100 MG tablet    Sig: TAKE ONE TABLET BY MOUTH AS NEEDED FOR MIGRAINE OR HEADACHE. MAY REPEAT IN 2 HOURS IF HEADACHE PERSISTS OR RECURS.    Dispense:  10 tablet    Refill:  3  . amitriptyline (ELAVIL) 10 MG tablet    Sig: 1 qhs X 3d, then 2 hs X3d, then 3 hs X 3d then 4 hs    Dispense:  120 tablet    Refill:  5     Follow-up: Return in about 1 month (around 08/08/2014).  Claretta Fraise, M.D.

## 2014-07-10 ENCOUNTER — Telehealth: Payer: Self-pay | Admitting: Family Medicine

## 2014-07-10 ENCOUNTER — Other Ambulatory Visit: Payer: Self-pay | Admitting: Family Medicine

## 2014-07-10 MED ORDER — PROMETHAZINE HCL 25 MG PO TABS: 25.0000 mg | ORAL_TABLET | Freq: Four times a day (QID) | ORAL | Status: DC | PRN

## 2014-07-10 MED ORDER — AMITRIPTYLINE HCL 10 MG PO TABS: ORAL_TABLET | ORAL | Status: DC

## 2014-07-10 MED ORDER — SUMATRIPTAN SUCCINATE 100 MG PO TABS: ORAL_TABLET | ORAL | Status: DC

## 2014-07-23 ENCOUNTER — Encounter: Payer: Self-pay | Admitting: Family Medicine

## 2014-07-23 ENCOUNTER — Ambulatory Visit (INDEPENDENT_AMBULATORY_CARE_PROVIDER_SITE_OTHER): Payer: 59 | Admitting: Family Medicine

## 2014-07-23 VITALS — BP 113/71 | HR 90 | Temp 97.8°F | Ht 66.0 in | Wt 209.0 lb

## 2014-07-23 DIAGNOSIS — G43011 Migraine without aura, intractable, with status migrainosus: Secondary | ICD-10-CM | POA: Diagnosis not present

## 2014-07-23 MED ORDER — FROVATRIPTAN SUCCINATE 2.5 MG PO TABS: 2.5000 mg | ORAL_TABLET | ORAL | Status: DC | PRN

## 2014-07-23 MED ORDER — METOCLOPRAMIDE HCL 5 MG PO TABS: ORAL_TABLET | ORAL | Status: DC

## 2014-07-23 MED ORDER — TOPIRAMATE 25 MG PO TABS: 25.0000 mg | ORAL_TABLET | Freq: Every day | ORAL | Status: DC

## 2014-07-23 NOTE — Patient Instructions (Signed)

## 2014-07-23 NOTE — Progress Notes (Signed)
Subjective:  Patient ID: Kathy Ewing, female    DOB: 1970-09-07  Age: 44 y.o. MRN: [id number]  CC: Headache   HPI Kathy Ewing presents for daily HA.  ranges 4-10/10. She did get some relief from the nausea by using the ondansetron. The headaches have not been diminished. She is very concerned about 9 pounds weight gain. She is now on 3 of the amitriptyline daily. So far they have not decreased her frequency or intensity of migraine. The migraine diagnosis is based on her history and her stating that she had been treated for it by a neurologist in the past. Currently she does have photophobia and phonophobia with her headache. The headache is a dull ache with some throbbing it is occasionally unilateral but frequently not. She is using ibuprofen because the sumatriptan and offered no relief. However she only on one occasion used it 2 hours after the first dose. It did not give relief on that one occasion.  History Kathy Ewing has a past medical history of B12 deficiency; Vitamin D deficiency; Anxiety; Headache; and Numbness.   She has past surgical history that includes Cesarean section.   Her family history includes Anxiety disorder in her brother; Cerebral palsy in her daughter; Depression in her daughter; Hypertension in her mother; Migraines in her sister; Multiple sclerosis in her sister.She reports that she has been smoking Cigarettes.  She has a 15 pack-year smoking history. She has never used smokeless tobacco. She reports that she does not drink alcohol or use illicit drugs.  Outpatient Prescriptions Prior to Visit  Medication Sig Dispense Refill  . ALPRAZolam (XANAX) 0.5 MG tablet TAKE 1 TO 2 TABLETS BY MOUTH TWICE DAILYAS NEEDED FOR ANXIETY 60 tablet 0  . butalbital-acetaminophen-caffeine (FIORICET) 50-325-40 MG per tablet Take 1-2 tablets by mouth every 6 (six) hours as needed for headache. 30 tablet 1  . naproxen (NAPROSYN) 500 MG tablet Take one tablet for severe headache, ok to  repeat in 12 hours.  Do not take more than twice per week. 20 tablet 2  . omeprazole (PRILOSEC) 20 MG capsule Take 1 capsule (20 mg total) by mouth daily. 30 capsule 11  . pravastatin (PRAVACHOL) 40 MG tablet Take 1 tablet (40 mg total) by mouth daily. 90 tablet 3  . terbinafine (LAMISIL) 250 MG tablet Take 1 tablet (250 mg total) by mouth daily. 90 tablet 0  . traMADol (ULTRAM) 50 MG tablet Take 1 tablet (50 mg total) by mouth every 8 (eight) hours as needed. 30 tablet 0  . Vitamin D, Ergocalciferol, (DRISDOL) 50000 UNITS CAPS capsule Take 1 capsule (50,000 Units total) by mouth every 7 (seven) days. 18 capsule 0  . amitriptyline (ELAVIL) 10 MG tablet 1 qhs X 3d, then 2 hs X3d, then 3 hs X 3d then 4 hs 120 tablet 5  . SUMAtriptan (IMITREX) 100 MG tablet TAKE ONE TABLET BY MOUTH AS NEEDED FOR MIGRAINE OR HEADACHE. MAY REPEAT IN 2 HOURS IF HEADACHE PERSISTS OR RECURS. 10 tablet 3  . promethazine (PHENERGAN) 25 MG tablet Take 1 tablet (25 mg total) by mouth every 6 (six) hours as needed for nausea. 30 tablet 5   No facility-administered medications prior to visit.    ROS Review of Systems  Constitutional: Positive for unexpected weight change. Negative for fever, chills, diaphoresis, appetite change and fatigue.  HENT: Negative for congestion, ear pain, hearing loss, postnasal drip, rhinorrhea, sneezing, sore throat and trouble swallowing.   Eyes: Negative for pain.  Respiratory: Negative for  cough, chest tightness and shortness of breath.   Cardiovascular: Negative for chest pain and palpitations.  Gastrointestinal: Positive for nausea. Negative for vomiting, abdominal pain, diarrhea and constipation.  Genitourinary: Negative for dysuria, frequency and menstrual problem.  Musculoskeletal: Negative for joint swelling and arthralgias.  Skin: Negative for rash.  Neurological: Positive for headaches. Negative for dizziness, seizures, speech difficulty, weakness and numbness.    Psychiatric/Behavioral: Negative for dysphoric mood and agitation.    Objective:  BP 113/71 mmHg  Pulse 90  Temp(Src) 97.8 F (36.6 C) (Oral)  Ht 5' 6"  (1.676 m)  Wt 209 lb (94.802 kg)  BMI 33.75 kg/m2  LMP 07/19/2014  BP Readings from Last 3 Encounters:  07/23/14 113/71  07/09/14 124/70  05/05/14 120/80    Wt Readings from Last 3 Encounters:  07/23/14 209 lb (94.802 kg)  07/09/14 201 lb 9.6 oz (91.445 kg)  05/05/14 202 lb (91.627 kg)     Physical Exam  Constitutional: She is oriented to person, place, and time. She appears well-developed and well-nourished. No distress.  HENT:  Head: Normocephalic and atraumatic.  Right Ear: External ear normal.  Left Ear: External ear normal.  Nose: Nose normal.  Mouth/Throat: Oropharynx is clear and moist.  Eyes: Conjunctivae and EOM are normal. Pupils are equal, round, and reactive to light.  Neck: Normal range of motion. Neck supple. No thyromegaly present.  Cardiovascular: Normal rate, regular rhythm and normal heart sounds.   No murmur heard. Pulmonary/Chest: Effort normal and breath sounds normal. No respiratory distress. She has no wheezes. She has no rales.  Abdominal: Soft. Bowel sounds are normal. She exhibits no distension. There is no tenderness.  Lymphadenopathy:    She has no cervical adenopathy.  Neurological: She is alert and oriented to person, place, and time. She has normal reflexes.  Skin: Skin is warm and dry.  Psychiatric: She has a normal mood and affect. Her behavior is normal. Judgment and thought content normal.    No results found for: HGBA1C  Lab Results  Component Value Date   WBC 7.7 04/09/2014   HGB 13.4 04/09/2014   HCT 42.4 04/09/2014   PLT 345 08/17/2010   GLUCOSE 82 02/19/2014   CHOL 219* 02/19/2014   TRIG 140 02/19/2014   HDL 43 02/19/2014   LDLCALC 148* 02/19/2014   ALT 26 02/19/2014   AST 21 02/19/2014   NA 143 02/19/2014   K 4.2 02/19/2014   CL 104 02/19/2014   CREATININE 0.72  02/19/2014   BUN 5* 02/19/2014   CO2 23 02/19/2014   TSH 3.180 02/19/2014    US Transvaginal Non-ob  04/28/2014   CLINICAL DATA:  Pelvic pain for 3 weeks, history of ovarian cysts and prior Caesarean sections  EXAM: TRANSABDOMINAL AND TRANSVAGINAL ULTRASOUND OF PELVIS  TECHNIQUE: Both transabdominal and transvaginal ultrasound examinations of the pelvis were performed. Transabdominal technique was performed for global imaging of the pelvis including uterus, ovaries, adnexal regions, and pelvic cul-de-sac. It was necessary to proceed with endovaginal exam following the transabdominal exam to visualize the endometrium and ovaries. Images are not yet in PACs but are reviewed at the modality.  COMPARISON:  04/09/2014  FINDINGS: Uterus  Measurements: 9.8 x 4.0 x 5.3 cm. Nodule identified at RIGHT lateral aspect of uterine fundus likely representing a leiomyoma 1.9 x 2.2 x 1.6 cm. No additional uterine masses.  Endometrium  Thickness: 7 mm thick, normal. Normal tri-layered appearance without focal abnormality. Small amount of endocervical fluid noted.  Right ovary  Measurements: 3.4  x 2.0 x 2.0 cm. Dominant follicle 1.8 cm diameter. Otherwise normal appearance.  Left ovary  Measurements: 2.4 x 1.9 x 1.7 cm. Normal morphology without mass.  Other findings  Trace free pelvic fluid.  No other pelvic masses.  IMPRESSION: Probable small uterine leiomyoma at RIGHT fundus 1.9 x 2.2 x 1.6 cm.  Otherwise normal exam.   Electronically Signed   By: Lavonia Dana M.D.   On: 04/28/2014 17:33   US Pelvis Complete  04/28/2014   CLINICAL DATA:  Pelvic pain for 3 weeks, history of ovarian cysts and prior Caesarean sections  EXAM: TRANSABDOMINAL AND TRANSVAGINAL ULTRASOUND OF PELVIS  TECHNIQUE: Both transabdominal and transvaginal ultrasound examinations of the pelvis were performed. Transabdominal technique was performed for global imaging of the pelvis including uterus, ovaries, adnexal regions, and pelvic cul-de-sac. It was  necessary to proceed with endovaginal exam following the transabdominal exam to visualize the endometrium and ovaries. Images are not yet in PACs but are reviewed at the modality.  COMPARISON:  04/09/2014  FINDINGS: Uterus  Measurements: 9.8 x 4.0 x 5.3 cm. Nodule identified at RIGHT lateral aspect of uterine fundus likely representing a leiomyoma 1.9 x 2.2 x 1.6 cm. No additional uterine masses.  Endometrium  Thickness: 7 mm thick, normal. Normal tri-layered appearance without focal abnormality. Small amount of endocervical fluid noted.  Right ovary  Measurements: 3.4 x 2.0 x 2.0 cm. Dominant follicle 1.8 cm diameter. Otherwise normal appearance.  Left ovary  Measurements: 2.4 x 1.9 x 1.7 cm. Normal morphology without mass.  Other findings  Trace free pelvic fluid.  No other pelvic masses.  IMPRESSION: Probable small uterine leiomyoma at RIGHT fundus 1.9 x 2.2 x 1.6 cm.  Otherwise normal exam.   Electronically Signed   By: Lavonia Dana M.D.   On: 04/28/2014 17:33    Assessment & Plan:   Kathy Ewing was seen today for headache.  Diagnoses and all orders for this visit:  Intractable migraine without aura and with status migrainosus Orders: -     topiramate (TOPAMAX) 25 MG tablet; Take 1 tablet (25 mg total) by mouth daily. Take 1 daily for 1 week then 2 daily for 1 week then 3 daily for 1 week then 4 daily. Take them together at bedtime -     CMP14+EGFR; Future -     POCT CBC; Future -     Lipid panel; Future -     Vitamin B12; Future -     Vit D  25 hydroxy (rtn osteoporosis monitoring); Future  Other orders -     frovatriptan (FROVA) 2.5 MG tablet; Take 1 tablet (2.5 mg total) by mouth as needed for migraine. If recurs, may repeat after 2 hours. Max of 3 tabs in 24 hours. -     metoCLOPramide (REGLAN) 5 MG tablet; Take one as needed at the onset of HA   I have discontinued Ms. Schriefer's SUMAtriptan and amitriptyline. I am also having her start on frovatriptan, metoCLOPramide, and topiramate.  Additionally, I am having her maintain her butalbital-acetaminophen-caffeine, omeprazole, naproxen, pravastatin, Vitamin D (Ergocalciferol), terbinafine, traMADol, ALPRAZolam, and promethazine.  Meds ordered this encounter  Medications  . frovatriptan (FROVA) 2.5 MG tablet    Sig: Take 1 tablet (2.5 mg total) by mouth as needed for migraine. If recurs, may repeat after 2 hours. Max of 3 tabs in 24 hours.    Dispense:  10 tablet    Refill:  0  . metoCLOPramide (REGLAN) 5 MG tablet    Sig:  Take one as needed at the onset of HA    Dispense:  30 tablet    Refill:  5  . topiramate (TOPAMAX) 25 MG tablet    Sig: Take 1 tablet (25 mg total) by mouth daily. Take 1 daily for 1 week then 2 daily for 1 week then 3 daily for 1 week then 4 daily. Take them together at bedtime    Dispense:  120 tablet    Refill:  0     Follow-up: Return in about 1 month (around 08/23/2014).  Claretta Fraise, M.D.

## 2014-07-31 ENCOUNTER — Other Ambulatory Visit (INDEPENDENT_AMBULATORY_CARE_PROVIDER_SITE_OTHER): Payer: 59

## 2014-07-31 ENCOUNTER — Telehealth: Payer: Self-pay | Admitting: Family

## 2014-07-31 DIAGNOSIS — G43011 Migraine without aura, intractable, with status migrainosus: Secondary | ICD-10-CM | POA: Diagnosis not present

## 2014-07-31 LAB — POCT CBC
Granulocyte percent: 62.1 %G (ref 37–80)
HCT, POC: 40.3 % (ref 37.7–47.9)
Hemoglobin: 13.2 g/dL (ref 12.2–16.2)
Lymph, poc: 2.1 (ref 0.6–3.4)
MCH, POC: 28.3 pg (ref 27–31.2)
MCHC: 32.7 g/dL (ref 31.8–35.4)
MCV: 86.6 fL (ref 80–97)
MPV: 8.3 fL (ref 0–99.8)
POC Granulocyte: 4.8 (ref 2–6.9)
POC LYMPH PERCENT: 27.8 %L (ref 10–50)
Platelet Count, POC: 367 10*3/uL (ref 142–424)
RBC: 4.65 M/uL (ref 4.04–5.48)
RDW, POC: 13.8 %
WBC: 7.7 10*3/uL (ref 4.6–10.2)

## 2014-08-01 LAB — CMP14+EGFR
ALBUMIN: 4 g/dL (ref 3.5–5.5)
ALK PHOS: 98 IU/L (ref 39–117)
ALT: 31 IU/L (ref 0–32)
AST: 24 IU/L (ref 0–40)
Albumin/Globulin Ratio: 1.7 (ref 1.1–2.5)
BUN/Creatinine Ratio: 13 (ref 9–23)
BUN: 9 mg/dL (ref 6–24)
Bilirubin Total: 0.6 mg/dL (ref 0.0–1.2)
CALCIUM: 8.9 mg/dL (ref 8.7–10.2)
CO2: 21 mmol/L (ref 18–29)
Chloride: 106 mmol/L (ref 97–108)
Creatinine, Ser: 0.71 mg/dL (ref 0.57–1.00)
GFR calc Af Amer: 121 mL/min/{1.73_m2} (ref >59)
GFR, EST NON AFRICAN AMERICAN: 105 mL/min/{1.73_m2} (ref >59)
Globulin, Total: 2.4 g/dL (ref 1.5–4.5)
Glucose: 95 mg/dL (ref 65–99)
Potassium: 3.8 mmol/L (ref 3.5–5.2)
Sodium: 141 mmol/L (ref 134–144)
Total Protein: 6.4 g/dL (ref 6.0–8.5)

## 2014-08-01 LAB — VITAMIN B12: Vitamin B-12: 394 pg/mL (ref 211–946)

## 2014-08-01 LAB — LIPID PANEL
CHOLESTEROL TOTAL: 224 mg/dL — AB (ref 100–199)
Chol/HDL Ratio: 5.7 ratio units — ABNORMAL HIGH (ref 0.0–4.4)
HDL: 39 mg/dL — AB (ref >39)
LDL Calculated: 165 mg/dL — ABNORMAL HIGH (ref 0–99)
Triglycerides: 102 mg/dL (ref 0–149)
VLDL Cholesterol Cal: 20 mg/dL (ref 5–40)

## 2014-08-01 LAB — VITAMIN D 25 HYDROXY (VIT D DEFICIENCY, FRACTURES): Vit D, 25-Hydroxy: 13.2 ng/mL — ABNORMAL LOW (ref 30.0–100.0)

## 2014-08-01 MED ORDER — ALPRAZOLAM 0.5 MG PO TABS: ORAL_TABLET | ORAL | Status: DC

## 2014-08-01 NOTE — Telephone Encounter (Signed)
Called to [place]

## 2014-08-05 ENCOUNTER — Telehealth: Payer: Self-pay | Admitting: Family

## 2014-08-05 MED ORDER — ATORVASTATIN CALCIUM 40 MG PO TABS: 40.0000 mg | ORAL_TABLET | Freq: Every day | ORAL | Status: DC

## 2014-08-05 NOTE — Telephone Encounter (Signed)
Patient aware of results.

## 2014-08-14 ENCOUNTER — Ambulatory Visit (INDEPENDENT_AMBULATORY_CARE_PROVIDER_SITE_OTHER): Payer: 59 | Admitting: Family Medicine

## 2014-08-14 ENCOUNTER — Encounter: Payer: Self-pay | Admitting: Family Medicine

## 2014-08-14 VITALS — BP 107/67 | HR 75 | Temp 97.5°F | Ht 66.0 in | Wt 198.2 lb

## 2014-08-14 DIAGNOSIS — G43011 Migraine without aura, intractable, with status migrainosus: Secondary | ICD-10-CM | POA: Diagnosis not present

## 2014-08-14 DIAGNOSIS — E538 Deficiency of other specified B group vitamins: Secondary | ICD-10-CM | POA: Diagnosis not present

## 2014-08-14 DIAGNOSIS — R202 Paresthesia of skin: Secondary | ICD-10-CM | POA: Diagnosis not present

## 2014-08-14 MED ORDER — CYANOCOBALAMIN 1000 MCG/ML IJ SOLN
1000.0000 ug | INTRAMUSCULAR | Status: AC
  Administered 2014-08-14 – 2015-06-26 (×6): 1000 ug via INTRAMUSCULAR

## 2014-08-14 MED ORDER — TOPIRAMATE 50 MG PO TABS: ORAL_TABLET | ORAL | Status: DC

## 2014-08-14 MED ORDER — TOPIRAMATE 25 MG PO TABS: ORAL_TABLET | ORAL | Status: DC

## 2014-08-14 NOTE — Progress Notes (Signed)
Subjective:  Patient ID: Kathy Ewing, female    DOB: August 21, 1970  Age: 44 y.o. MRN: [id number]  CC: Migraine and Hyperlipidemia   HPI Kathy Ewing presents for aching all over over last week. This week the aching has been replaced by a tingling all over. It's more prickly feeling. Headaches are unchanged by treatment at this point.  History Kathy Ewing has a past medical history of B12 deficiency; Vitamin D deficiency; Anxiety; Headache; and Numbness.   She has past surgical history that includes Cesarean section.   Her family history includes Anxiety disorder in her brother; Cerebral palsy in her daughter; Depression in her daughter; Hypertension in her mother; Migraines in her sister; Multiple sclerosis in her sister.She reports that she has been smoking Cigarettes.  She has a 15 pack-year smoking history. She has never used smokeless tobacco. She reports that she does not drink alcohol or use illicit drugs.  Outpatient Prescriptions Prior to Visit  Medication Sig Dispense Refill  . ALPRAZolam (XANAX) 0.5 MG tablet TAKE 1 TO 2 TABLETS BY MOUTH TWICE DAILYAS NEEDED FOR ANXIETY 60 tablet 0  . atorvastatin (LIPITOR) 40 MG tablet Take 1 tablet (40 mg total) by mouth daily. 90 tablet 1  . butalbital-acetaminophen-caffeine (FIORICET) 50-325-40 MG per tablet Take 1-2 tablets by mouth every 6 (six) hours as needed for headache. 30 tablet 1  . frovatriptan (FROVA) 2.5 MG tablet Take 1 tablet (2.5 mg total) by mouth as needed for migraine. If recurs, may repeat after 2 hours. Max of 3 tabs in 24 hours. 10 tablet 0  . metoCLOPramide (REGLAN) 5 MG tablet Take one as needed at the onset of HA 30 tablet 5  . naproxen (NAPROSYN) 500 MG tablet Take one tablet for severe headache, ok to repeat in 12 hours.  Do not take more than twice per week. 20 tablet 2  . omeprazole (PRILOSEC) 20 MG capsule Take 1 capsule (20 mg total) by mouth daily. 30 capsule 11  . pravastatin (PRAVACHOL) 40 MG tablet Take 1  tablet (40 mg total) by mouth daily. 90 tablet 3  . terbinafine (LAMISIL) 250 MG tablet Take 1 tablet (250 mg total) by mouth daily. 90 tablet 0  . traMADol (ULTRAM) 50 MG tablet Take 1 tablet (50 mg total) by mouth every 8 (eight) hours as needed. 30 tablet 0  . Vitamin D, Ergocalciferol, (DRISDOL) 50000 UNITS CAPS capsule Take 1 capsule (50,000 Units total) by mouth every 7 (seven) days. 18 capsule 0  . topiramate (TOPAMAX) 25 MG tablet Take 1 tablet (25 mg total) by mouth daily. Take 1 daily for 1 week then 2 daily for 1 week then 3 daily for 1 week then 4 daily. Take them together at bedtime 120 tablet 0  . promethazine (PHENERGAN) 25 MG tablet Take 1 tablet (25 mg total) by mouth every 6 (six) hours as needed for nausea. 30 tablet 5   No facility-administered medications prior to visit.    ROS Review of Systems  Constitutional: Negative for fever, chills, diaphoresis, appetite change, fatigue and unexpected weight change.  HENT: Negative for congestion, ear pain, hearing loss, postnasal drip, rhinorrhea, sneezing, sore throat and trouble swallowing.   Eyes: Negative for pain.  Respiratory: Negative for cough, chest tightness and shortness of breath.   Cardiovascular: Negative for chest pain and palpitations.  Gastrointestinal: Negative for nausea, vomiting, abdominal pain, diarrhea and constipation.  Genitourinary: Negative for dysuria, frequency and menstrual problem.  Musculoskeletal: Negative for joint swelling and arthralgias.  Skin: Negative for rash.  Neurological: Positive for headaches. Negative for dizziness, weakness and numbness.  Psychiatric/Behavioral: Negative for dysphoric mood and agitation.    Objective:  BP 107/67 mmHg  Pulse 75  Temp(Src) 97.5 F (36.4 C) (Oral)  Ht 5\' 6"  (1.676 m)  Wt 198 lb 3.2 oz (89.903 kg)  BMI 32.01 kg/m2  LMP 08/14/2014  BP Readings from Last 3 Encounters:  08/14/14 107/67  07/23/14 113/71  07/09/14 124/70    Wt Readings from  Last 3 Encounters:  08/14/14 198 lb 3.2 oz (89.903 kg)  07/23/14 209 lb (94.802 kg)  07/09/14 201 lb 9.6 oz (91.445 kg)     Physical Exam  Constitutional: She is oriented to person, place, and time. She appears well-developed and well-nourished. No distress.  HENT:  Head: Normocephalic and atraumatic.  Eyes: Conjunctivae are normal. Pupils are equal, round, and reactive to light.  Neck: Normal range of motion. Neck supple. No thyromegaly present.  Cardiovascular: Normal rate, regular rhythm and normal heart sounds.   No murmur heard. Pulmonary/Chest: Effort normal and breath sounds normal. No respiratory distress. She has no wheezes. She has no rales.  Abdominal: Soft. Bowel sounds are normal. She exhibits no distension. There is no tenderness.  Musculoskeletal: Normal range of motion.  Lymphadenopathy:    She has no cervical adenopathy.  Neurological: She is alert and oriented to person, place, and time.  Skin: Skin is warm and dry.  Psychiatric: She has a normal mood and affect. Her behavior is normal. Judgment and thought content normal.    No results found for: HGBA1C  Lab Results  Component Value Date   WBC 7.7 07/31/2014   HGB 13.2 07/31/2014   HCT 40.3 07/31/2014   PLT 345 08/17/2010   GLUCOSE 95 07/31/2014   CHOL 224* 07/31/2014   TRIG 102 07/31/2014   HDL 39* 07/31/2014   LDLCALC 165* 07/31/2014   ALT 31 07/31/2014   AST 24 07/31/2014   NA 141 07/31/2014   K 3.8 07/31/2014   CL 106 07/31/2014   CREATININE 0.71 07/31/2014   BUN 9 07/31/2014   CO2 21 07/31/2014   TSH 3.180 02/19/2014    US Transvaginal Non-ob  04/28/2014   CLINICAL DATA:  Pelvic pain for 3 weeks, history of ovarian cysts and prior Caesarean sections  EXAM: TRANSABDOMINAL AND TRANSVAGINAL ULTRASOUND OF PELVIS  TECHNIQUE: Both transabdominal and transvaginal ultrasound examinations of the pelvis were performed. Transabdominal technique was performed for global imaging of the pelvis including  uterus, ovaries, adnexal regions, and pelvic cul-de-sac. It was necessary to proceed with endovaginal exam following the transabdominal exam to visualize the endometrium and ovaries. Images are not yet in PACs but are reviewed at the modality.  COMPARISON:  04/09/2014  FINDINGS: Uterus  Measurements: 9.8 x 4.0 x 5.3 cm. Nodule identified at RIGHT lateral aspect of uterine fundus likely representing a leiomyoma 1.9 x 2.2 x 1.6 cm. No additional uterine masses.  Endometrium  Thickness: 7 mm thick, normal. Normal tri-layered appearance without focal abnormality. Small amount of endocervical fluid noted.  Right ovary  Measurements: 3.4 x 2.0 x 2.0 cm. Dominant follicle 1.8 cm diameter. Otherwise normal appearance.  Left ovary  Measurements: 2.4 x 1.9 x 1.7 cm. Normal morphology without mass.  Other findings  Trace free pelvic fluid.  No other pelvic masses.  IMPRESSION: Probable small uterine leiomyoma at RIGHT fundus 1.9 x 2.2 x 1.6 cm.  Otherwise normal exam.   Electronically Signed   By: Lavonia Dana  M.D.   On: 04/28/2014 17:33   US Pelvis Complete  04/28/2014   CLINICAL DATA:  Pelvic pain for 3 weeks, history of ovarian cysts and prior Caesarean sections  EXAM: TRANSABDOMINAL AND TRANSVAGINAL ULTRASOUND OF PELVIS  TECHNIQUE: Both transabdominal and transvaginal ultrasound examinations of the pelvis were performed. Transabdominal technique was performed for global imaging of the pelvis including uterus, ovaries, adnexal regions, and pelvic cul-de-sac. It was necessary to proceed with endovaginal exam following the transabdominal exam to visualize the endometrium and ovaries. Images are not yet in PACs but are reviewed at the modality.  COMPARISON:  04/09/2014  FINDINGS: Uterus  Measurements: 9.8 x 4.0 x 5.3 cm. Nodule identified at RIGHT lateral aspect of uterine fundus likely representing a leiomyoma 1.9 x 2.2 x 1.6 cm. No additional uterine masses.  Endometrium  Thickness: 7 mm thick, normal. Normal tri-layered  appearance without focal abnormality. Small amount of endocervical fluid noted.  Right ovary  Measurements: 3.4 x 2.0 x 2.0 cm. Dominant follicle 1.8 cm diameter. Otherwise normal appearance.  Left ovary  Measurements: 2.4 x 1.9 x 1.7 cm. Normal morphology without mass.  Other findings  Trace free pelvic fluid.  No other pelvic masses.  IMPRESSION: Probable small uterine leiomyoma at RIGHT fundus 1.9 x 2.2 x 1.6 cm.  Otherwise normal exam.   Electronically Signed   By: Lavonia Dana M.D.   On: 04/28/2014 17:33    Assessment & Plan:   Lenox was seen today for migraine and hyperlipidemia.  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 -     topiramate (TOPAMAX) 50 MG tablet; Three daily for one week then four daily.  Paresthesia  Vitamin B12 deficiency Orders: -     cyanocobalamin ((VITAMIN B-12)) injection 1,000 mcg; Inject 1 mL (1,000 mcg total) into the muscle every 30 (thirty) days.   I have discontinued Ms. Truxillo's topiramate. I have also changed her topiramate. Additionally, I am having her maintain her butalbital-acetaminophen-caffeine, omeprazole, naproxen, pravastatin, Vitamin D (Ergocalciferol), terbinafine, traMADol, promethazine, frovatriptan, metoCLOPramide, ALPRAZolam, and atorvastatin. We administered cyanocobalamin. We will continue to administer cyanocobalamin.  Meds ordered this encounter  Medications  . DISCONTD: topiramate (TOPAMAX) 25 MG tablet    Sig: Three daily for one week then four daily.    Dispense:  120 tablet    Refill:  0  . cyanocobalamin ((VITAMIN B-12)) injection 1,000 mcg    Sig:   . topiramate (TOPAMAX) 50 MG tablet    Sig: Three daily for one week then four daily.    Dispense:  120 tablet    Refill:  2     Follow-up: Return in about 1 month (around 09/14/2014) for cholesterol,  fasting.  Claretta Fraise, M.D.

## 2014-08-26 ENCOUNTER — Ambulatory Visit: Payer: 59 | Admitting: Family Medicine

## 2014-08-28 ENCOUNTER — Other Ambulatory Visit: Payer: Self-pay | Admitting: Family Medicine

## 2014-09-01 ENCOUNTER — Other Ambulatory Visit: Payer: Self-pay | Admitting: Family Medicine

## 2014-09-01 ENCOUNTER — Other Ambulatory Visit: Payer: Self-pay | Admitting: Family

## 2014-09-01 DIAGNOSIS — B351 Tinea unguium: Secondary | ICD-10-CM

## 2014-09-02 MED ORDER — ALPRAZOLAM 0.5 MG PO TABS: ORAL_TABLET | ORAL | Status: DC

## 2014-09-02 MED ORDER — TERBINAFINE HCL 250 MG PO TABS: 250.0000 mg | ORAL_TABLET | Freq: Every day | ORAL | Status: DC

## 2014-09-02 NOTE — Telephone Encounter (Signed)
rx called into pharmacy

## 2014-09-11 ENCOUNTER — Other Ambulatory Visit (INDEPENDENT_AMBULATORY_CARE_PROVIDER_SITE_OTHER): Payer: 59

## 2014-09-11 DIAGNOSIS — E785 Hyperlipidemia, unspecified: Secondary | ICD-10-CM | POA: Diagnosis not present

## 2014-09-11 NOTE — Progress Notes (Signed)
Lab only 

## 2014-09-12 LAB — CMP14+EGFR
ALBUMIN: 4.1 g/dL (ref 3.5–5.5)
ALK PHOS: 85 IU/L (ref 39–117)
ALT: 21 IU/L (ref 0–32)
AST: 18 IU/L (ref 0–40)
Albumin/Globulin Ratio: 2 (ref 1.1–2.5)
BUN / CREAT RATIO: 16 (ref 9–23)
BUN: 12 mg/dL (ref 6–24)
Bilirubin Total: 0.4 mg/dL (ref 0.0–1.2)
CO2: 16 mmol/L — ABNORMAL LOW (ref 18–29)
CREATININE: 0.76 mg/dL (ref 0.57–1.00)
Calcium: 8.8 mg/dL (ref 8.7–10.2)
Chloride: 110 mmol/L — ABNORMAL HIGH (ref 97–108)
GFR calc Af Amer: 112 mL/min/{1.73_m2} (ref >59)
GFR calc non Af Amer: 97 mL/min/{1.73_m2} (ref >59)
GLUCOSE: 87 mg/dL (ref 65–99)
Globulin, Total: 2.1 g/dL (ref 1.5–4.5)
Potassium: 4.1 mmol/L (ref 3.5–5.2)
Sodium: 142 mmol/L (ref 134–144)
TOTAL PROTEIN: 6.2 g/dL (ref 6.0–8.5)

## 2014-09-12 LAB — LIPID PANEL
CHOLESTEROL TOTAL: 100 mg/dL (ref 100–199)
Chol/HDL Ratio: 2.4 ratio units (ref 0.0–4.4)
HDL: 41 mg/dL (ref >39)
LDL CALC: 47 mg/dL (ref 0–99)
Triglycerides: 62 mg/dL (ref 0–149)
VLDL Cholesterol Cal: 12 mg/dL (ref 5–40)

## 2014-09-15 ENCOUNTER — Encounter: Payer: Self-pay | Admitting: Family Medicine

## 2014-09-15 ENCOUNTER — Ambulatory Visit (INDEPENDENT_AMBULATORY_CARE_PROVIDER_SITE_OTHER): Payer: 59 | Admitting: Family Medicine

## 2014-09-15 ENCOUNTER — Ambulatory Visit (HOSPITAL_COMMUNITY)
Admission: RE | Admit: 2014-09-15 | Discharge: 2014-09-15 | Disposition: A | Discharge status: Home / Self Care | Payer: 59 | Source: Ambulatory Visit | Attending: Family Medicine | Admitting: Family Medicine

## 2014-09-15 VITALS — BP 98/75 | HR 79 | Temp 97.1°F | Ht 66.0 in | Wt 185.6 lb

## 2014-09-15 DIAGNOSIS — R1011 Right upper quadrant pain: Secondary | ICD-10-CM

## 2014-09-15 DIAGNOSIS — N760 Acute vaginitis: Secondary | ICD-10-CM | POA: Diagnosis not present

## 2014-09-15 DIAGNOSIS — K8020 Calculus of gallbladder without cholecystitis without obstruction: Secondary | ICD-10-CM | POA: Insufficient documentation

## 2014-09-15 DIAGNOSIS — E538 Deficiency of other specified B group vitamins: Secondary | ICD-10-CM | POA: Diagnosis not present

## 2014-09-15 DIAGNOSIS — B9689 Other specified bacterial agents as the cause of diseases classified elsewhere: Secondary | ICD-10-CM

## 2014-09-15 DIAGNOSIS — K8000 Calculus of gallbladder with acute cholecystitis without obstruction: Secondary | ICD-10-CM

## 2014-09-15 DIAGNOSIS — G43011 Migraine without aura, intractable, with status migrainosus: Secondary | ICD-10-CM

## 2014-09-15 DIAGNOSIS — A499 Bacterial infection, unspecified: Secondary | ICD-10-CM

## 2014-09-15 DIAGNOSIS — K8010 Calculus of gallbladder with chronic cholecystitis without obstruction: Secondary | ICD-10-CM

## 2014-09-15 MED ORDER — METRONIDAZOLE 500 MG PO TABS: 500.0000 mg | ORAL_TABLET | Freq: Two times a day (BID) | ORAL | Status: DC

## 2014-09-15 NOTE — Progress Notes (Signed)
Subjective:  Patient ID: Kathy Ewing, female    DOB: December 31, 1970  Age: 44 y.o. MRN: [id number]  CC: Hyperlipidemia and parathesia   HPI Kathy Ewing presents for Pain at angle of right scapula radiating to RUQ and to substernal area. 10/10 especially at night. In tears. Ibuprofen eases it a little. Not as bad during the day.Onset 1 month ago, crescendoed last 2 days. Increasing eructation and flatulence.  HA comes and goes. In bed all day for one last week with one.Some relief with Frova.Took 2 doses. Having one or two a month. Cut dose of topiramate back to 100 mg due to tingling although it is mild.  History Kathy Ewing has a past medical history of B12 deficiency; Vitamin D deficiency; Anxiety; Headache; and Numbness.   She has past surgical history that includes Cesarean section.   Her family history includes Anxiety disorder in her brother; Cerebral palsy in her daughter; Depression in her daughter; Hypertension in her mother; Migraines in her sister; Multiple sclerosis in her sister.She reports that she has been smoking Cigarettes.  She has a 15 pack-year smoking history. She has never used smokeless tobacco. She reports that she does not drink alcohol or use illicit drugs.  Outpatient Prescriptions Prior to Visit  Medication Sig Dispense Refill  . ALPRAZolam (XANAX) 0.5 MG tablet TAKE 1 TO 2 TABLETS BY MOUTH TWICE DAILYAS NEEDED FOR ANXIETY 60 tablet 0  . atorvastatin (LIPITOR) 40 MG tablet Take 1 tablet (40 mg total) by mouth daily. 90 tablet 1  . butalbital-acetaminophen-caffeine (FIORICET) 50-325-40 MG per tablet Take 1-2 tablets by mouth every 6 (six) hours as needed for headache. 30 tablet 1  . frovatriptan (FROVA) 2.5 MG tablet Take 1 tablet (2.5 mg total) by mouth as needed for migraine. If recurs, may repeat after 2 hours. Max of 3 tabs in 24 hours. 10 tablet 0  . metoCLOPramide (REGLAN) 5 MG tablet Take one as needed at the onset of HA 30 tablet 5  . naproxen (NAPROSYN) 500  MG tablet Take one tablet for severe headache, ok to repeat in 12 hours.  Do not take more than twice per week. 20 tablet 2  . omeprazole (PRILOSEC) 20 MG capsule Take 1 capsule (20 mg total) by mouth daily. 30 capsule 11  . terbinafine (LAMISIL) 250 MG tablet Take 1 tablet (250 mg total) by mouth daily. 90 tablet 0  . topiramate (TOPAMAX) 50 MG tablet Three daily for one week then four daily. 120 tablet 2  . traMADol (ULTRAM) 50 MG tablet Take 1 tablet (50 mg total) by mouth every 8 (eight) hours as needed. 30 tablet 0  . promethazine (PHENERGAN) 25 MG tablet Take 1 tablet (25 mg total) by mouth every 6 (six) hours as needed for nausea. 30 tablet 5  . pravastatin (PRAVACHOL) 40 MG tablet Take 1 tablet (40 mg total) by mouth daily. (Patient not taking: Reported on 09/15/2014) 90 tablet 3  . Vitamin D, Ergocalciferol, (DRISDOL) 50000 UNITS CAPS capsule Take 1 capsule (50,000 Units total) by mouth every 7 (seven) days. (Patient not taking: Reported on 09/15/2014) 18 capsule 0  . Vitamin D, Ergocalciferol, (DRISDOL) 50000 UNITS CAPS capsule TAKE 1 CAPSULE BY MOUTH EVERY 7 DAYS (Patient not taking: Reported on 09/15/2014) 18 capsule 0   Facility-Administered Medications Prior to Visit  Medication Dose Route Frequency Provider Last Rate Last Dose  . cyanocobalamin ((VITAMIN B-12)) injection 1,000 mcg  1,000 mcg Intramuscular Q30 days Mechele Claude, MD   1,000 mcg  at 09/15/14 1113    ROS Review of Systems  Constitutional: Negative for fever, chills, diaphoresis, appetite change, fatigue and unexpected weight change.  HENT: Negative for congestion, ear pain, hearing loss, postnasal drip, rhinorrhea, sneezing, sore throat and trouble swallowing.   Eyes: Negative for pain.  Respiratory: Negative for cough, chest tightness and shortness of breath.   Cardiovascular: Negative for chest pain and palpitations.  Gastrointestinal: Positive for abdominal pain. Negative for nausea, vomiting, diarrhea and  constipation.  Endocrine: Negative for polyphagia.  Genitourinary: Positive for vaginal discharge (same as usual. Frequent infection. Requests same antibiotic. ). Negative for dysuria, frequency and menstrual problem.  Musculoskeletal: Negative for joint swelling and arthralgias.  Skin: Negative for rash.  Neurological: Positive for headaches. Negative for dizziness, weakness and numbness.  Psychiatric/Behavioral: Negative for dysphoric mood and agitation.    Objective:  BP 98/75 mmHg  Pulse 79  Temp(Src) 97.1 F (36.2 C) (Oral)  Ht 5\' 6"  (1.676 m)  Wt 185 lb 9.6 oz (84.188 kg)  BMI 29.97 kg/m2  LMP 08/19/2014  BP Readings from Last 3 Encounters:  09/15/14 98/75  08/14/14 107/67  07/23/14 113/71    Wt Readings from Last 3 Encounters:  09/15/14 185 lb 9.6 oz (84.188 kg)  08/14/14 198 lb 3.2 oz (89.903 kg)  07/23/14 209 lb (94.802 kg)     Physical Exam  Constitutional: She is oriented to person, place, and time. She appears well-developed and well-nourished. No distress.  HENT:  Head: Normocephalic and atraumatic.  Right Ear: External ear normal.  Left Ear: External ear normal.  Nose: Nose normal.  Mouth/Throat: Oropharynx is clear and moist.  Eyes: Conjunctivae and EOM are normal. Pupils are equal, round, and reactive to light.  Neck: Normal range of motion. Neck supple. No thyromegaly present.  Cardiovascular: Normal rate, regular rhythm and normal heart sounds.   No murmur heard. Pulmonary/Chest: Effort normal and breath sounds normal. No respiratory distress. She has no wheezes. She has no rales.  Abdominal: Soft. Bowel sounds are normal. She exhibits no distension. There is tenderness (moderately severe RUQ tenderness with positive murphy's sign).  Genitourinary:  Declines exam   Lymphadenopathy:    She has no cervical adenopathy.  Neurological: She is alert and oriented to person, place, and time. She has normal reflexes.  Skin: Skin is warm and dry.    Psychiatric: She has a normal mood and affect. Her behavior is normal. Judgment and thought content normal.    No results found for: HGBA1C  Lab Results  Component Value Date   WBC 7.7 07/31/2014   HGB 13.2 07/31/2014   HCT 40.3 07/31/2014   PLT 345 08/17/2010   GLUCOSE 87 09/11/2014   CHOL 100 09/11/2014   TRIG 62 09/11/2014   HDL 41 09/11/2014   LDLCALC 47 09/11/2014   ALT 21 09/11/2014   AST 18 09/11/2014   NA 142 09/11/2014   K 4.1 09/11/2014   CL 110* 09/11/2014   CREATININE 0.76 09/11/2014   BUN 12 09/11/2014   CO2 16* 09/11/2014   TSH 3.180 02/19/2014    US Transvaginal Non-ob  04/28/2014   CLINICAL DATA:  Pelvic pain for 3 weeks, history of ovarian cysts and prior Caesarean sections  EXAM: TRANSABDOMINAL AND TRANSVAGINAL ULTRASOUND OF PELVIS  TECHNIQUE: Both transabdominal and transvaginal ultrasound examinations of the pelvis were performed. Transabdominal technique was performed for global imaging of the pelvis including uterus, ovaries, adnexal regions, and pelvic cul-de-sac. It was necessary to proceed with endovaginal exam following the  transabdominal exam to visualize the endometrium and ovaries. Images are not yet in PACs but are reviewed at the modality.  COMPARISON:  04/09/2014  FINDINGS: Uterus  Measurements: 9.8 x 4.0 x 5.3 cm. Nodule identified at RIGHT lateral aspect of uterine fundus likely representing a leiomyoma 1.9 x 2.2 x 1.6 cm. No additional uterine masses.  Endometrium  Thickness: 7 mm thick, normal. Normal tri-layered appearance without focal abnormality. Small amount of endocervical fluid noted.  Right ovary  Measurements: 3.4 x 2.0 x 2.0 cm. Dominant follicle 1.8 cm diameter. Otherwise normal appearance.  Left ovary  Measurements: 2.4 x 1.9 x 1.7 cm. Normal morphology without mass.  Other findings  Trace free pelvic fluid.  No other pelvic masses.  IMPRESSION: Probable small uterine leiomyoma at RIGHT fundus 1.9 x 2.2 x 1.6 cm.  Otherwise normal exam.    Electronically Signed   By: Ulyses Southward M.D.   On: 04/28/2014 17:33   US Pelvis Complete  04/28/2014   CLINICAL DATA:  Pelvic pain for 3 weeks, history of ovarian cysts and prior Caesarean sections  EXAM: TRANSABDOMINAL AND TRANSVAGINAL ULTRASOUND OF PELVIS  TECHNIQUE: Both transabdominal and transvaginal ultrasound examinations of the pelvis were performed. Transabdominal technique was performed for global imaging of the pelvis including uterus, ovaries, adnexal regions, and pelvic cul-de-sac. It was necessary to proceed with endovaginal exam following the transabdominal exam to visualize the endometrium and ovaries. Images are not yet in PACs but are reviewed at the modality.  COMPARISON:  04/09/2014  FINDINGS: Uterus  Measurements: 9.8 x 4.0 x 5.3 cm. Nodule identified at RIGHT lateral aspect of uterine fundus likely representing a leiomyoma 1.9 x 2.2 x 1.6 cm. No additional uterine masses.  Endometrium  Thickness: 7 mm thick, normal. Normal tri-layered appearance without focal abnormality. Small amount of endocervical fluid noted.  Right ovary  Measurements: 3.4 x 2.0 x 2.0 cm. Dominant follicle 1.8 cm diameter. Otherwise normal appearance.  Left ovary  Measurements: 2.4 x 1.9 x 1.7 cm. Normal morphology without mass.  Other findings  Trace free pelvic fluid.  No other pelvic masses.  IMPRESSION: Probable small uterine leiomyoma at RIGHT fundus 1.9 x 2.2 x 1.6 cm.  Otherwise normal exam.   Electronically Signed   By: Ulyses Southward M.D.   On: 04/28/2014 17:33    Assessment & Plan:   Surah was seen today for hyperlipidemia and parathesia.  Diagnoses and all orders for this visit:  RUQ pain -     US Abdomen Limited RUQ; Future -     US Abdomen Limited RUQ; Standing  Bacterial vaginosis -     metroNIDAZOLE (FLAGYL) 500 MG tablet; Take 1 tablet (500 mg total) by mouth 2 (two) times daily.  Intractable migraine without aura and with status migrainosus  Calculus of gallbladder with cholecystitis  without biliary obstruction, unspecified cholecystitis acuity -     Ambulatory referral to General Surgery   I have discontinued Ms. Federici's pravastatin, Vitamin D (Ergocalciferol), and Vitamin D (Ergocalciferol). I am also having her maintain her butalbital-acetaminophen-caffeine, omeprazole, naproxen, traMADol, promethazine, frovatriptan, metoCLOPramide, atorvastatin, topiramate, terbinafine, ALPRAZolam, and metroNIDAZOLE. We administered cyanocobalamin. We will continue to administer cyanocobalamin.  Meds ordered this encounter  Medications  . metroNIDAZOLE (FLAGYL) 500 MG tablet    Sig: Take 1 tablet (500 mg total) by mouth 2 (two) times daily.    Dispense:  14 tablet    Refill:  0     Follow-up: Return in about 1 month (around 10/16/2014).  Broadus John  Coty Student, M.D.

## 2014-09-16 ENCOUNTER — Telehealth: Payer: Self-pay | Admitting: Family Medicine

## 2014-09-30 ENCOUNTER — Other Ambulatory Visit: Payer: Self-pay | Admitting: General Surgery

## 2014-10-01 ENCOUNTER — Encounter (HOSPITAL_COMMUNITY): Payer: Self-pay | Admitting: *Deleted

## 2014-10-01 NOTE — Progress Notes (Signed)
Pt denies any cardiac history. States she was worked up for chest pain in November, 2015 and "they never found anything", states her heart "was good". Echo and Stress done 11/2013 - results in EPIC. Saw Dr. Maylon Cos at [facility].

## 2014-10-02 ENCOUNTER — Ambulatory Visit (HOSPITAL_COMMUNITY): Payer: 59 | Admitting: Anesthesiology

## 2014-10-02 ENCOUNTER — Encounter (HOSPITAL_COMMUNITY): Payer: Self-pay | Admitting: Surgery

## 2014-10-02 ENCOUNTER — Ambulatory Visit (HOSPITAL_COMMUNITY)
Admission: RE | Admit: 2014-10-02 | Discharge: 2014-10-02 | Disposition: A | Discharge status: Home / Self Care | Payer: 59 | Source: Ambulatory Visit | Attending: General Surgery | Admitting: General Surgery

## 2014-10-02 ENCOUNTER — Encounter (HOSPITAL_COMMUNITY)
Admission: RE | Disposition: A | Discharge status: Home / Self Care | Payer: Self-pay | Source: Ambulatory Visit | Attending: General Surgery

## 2014-10-02 DIAGNOSIS — E780 Pure hypercholesterolemia: Secondary | ICD-10-CM | POA: Insufficient documentation

## 2014-10-02 DIAGNOSIS — K219 Gastro-esophageal reflux disease without esophagitis: Secondary | ICD-10-CM | POA: Diagnosis not present

## 2014-10-02 DIAGNOSIS — K8010 Calculus of gallbladder with chronic cholecystitis without obstruction: Secondary | ICD-10-CM | POA: Insufficient documentation

## 2014-10-02 DIAGNOSIS — F419 Anxiety disorder, unspecified: Secondary | ICD-10-CM | POA: Diagnosis not present

## 2014-10-02 DIAGNOSIS — F17200 Nicotine dependence, unspecified, uncomplicated: Secondary | ICD-10-CM | POA: Insufficient documentation

## 2014-10-02 DIAGNOSIS — Z79899 Other long term (current) drug therapy: Secondary | ICD-10-CM | POA: Insufficient documentation

## 2014-10-02 DIAGNOSIS — G43909 Migraine, unspecified, not intractable, without status migrainosus: Secondary | ICD-10-CM | POA: Diagnosis not present

## 2014-10-02 DIAGNOSIS — K8080 Other cholelithiasis without obstruction: Secondary | ICD-10-CM | POA: Diagnosis present

## 2014-10-02 DIAGNOSIS — Z791 Long term (current) use of non-steroidal anti-inflammatories (NSAID): Secondary | ICD-10-CM | POA: Diagnosis not present

## 2014-10-02 HISTORY — PX: CHOLECYSTECTOMY: SHX55

## 2014-10-02 HISTORY — DX: Pure hypercholesterolemia, unspecified: E78.00

## 2014-10-02 HISTORY — DX: Gastro-esophageal reflux disease without esophagitis: K21.9

## 2014-10-02 HISTORY — DX: Pneumonia, unspecified organism: J18.9

## 2014-10-02 LAB — COMPREHENSIVE METABOLIC PANEL
ALBUMIN: 3.9 g/dL (ref 3.5–5.0)
ALT: 18 U/L (ref 14–54)
AST: 17 U/L (ref 15–41)
Alkaline Phosphatase: 72 U/L (ref 38–126)
Anion gap: 6 (ref 5–15)
BUN: 10 mg/dL (ref 6–20)
CALCIUM: 8.7 mg/dL — AB (ref 8.9–10.3)
CHLORIDE: 115 mmol/L — AB (ref 101–111)
CO2: 21 mmol/L — AB (ref 22–32)
CREATININE: 0.8 mg/dL (ref 0.44–1.00)
GFR calc Af Amer: >60 mL/min (ref >60)
GFR calc non Af Amer: >60 mL/min (ref >60)
GLUCOSE: 89 mg/dL (ref 65–99)
POTASSIUM: 4 mmol/L (ref 3.5–5.1)
SODIUM: 142 mmol/L (ref 135–145)
TOTAL PROTEIN: 6.2 g/dL — AB (ref 6.5–8.1)
Total Bilirubin: 0.5 mg/dL (ref 0.3–1.2)

## 2014-10-02 LAB — CBC WITH DIFFERENTIAL/PLATELET
BASOS ABS: 0.1 10*3/uL (ref 0.0–0.1)
BASOS PCT: 1 %
EOS PCT: 5 %
Eosinophils Absolute: 0.4 10*3/uL (ref 0.0–0.7)
HCT: 40.6 % (ref 36.0–46.0)
Hemoglobin: 13.3 g/dL (ref 12.0–15.0)
Lymphocytes Relative: 29 %
Lymphs Abs: 2.1 10*3/uL (ref 0.7–4.0)
MCH: 29.2 pg (ref 26.0–34.0)
MCHC: 32.8 g/dL (ref 30.0–36.0)
MCV: 89 fL (ref 78.0–100.0)
MONO ABS: 0.6 10*3/uL (ref 0.1–1.0)
Monocytes Relative: 8 %
Neutro Abs: 4.3 10*3/uL (ref 1.7–7.7)
Neutrophils Relative %: 57 %
PLATELETS: 297 10*3/uL (ref 150–400)
RBC: 4.56 MIL/uL (ref 3.87–5.11)
RDW: 13.8 % (ref 11.5–15.5)
WBC: 7.5 10*3/uL (ref 4.0–10.5)

## 2014-10-02 LAB — HCG, SERUM, QUALITATIVE: PREG SERUM: NEGATIVE

## 2014-10-02 LAB — LIPASE, BLOOD: Lipase: 22 U/L (ref 22–51)

## 2014-10-02 SURGERY — LAPAROSCOPIC CHOLECYSTECTOMY
Anesthesia: General

## 2014-10-02 MED ORDER — HYDROMORPHONE HCL 1 MG/ML IJ SOLN
0.2500 mg | INTRAMUSCULAR | Status: DC | PRN
  Administered 2014-10-02 (×2): 0.5 mg via INTRAVENOUS

## 2014-10-02 MED ORDER — ONDANSETRON HCL 4 MG/2ML IJ SOLN
INTRAMUSCULAR | Status: AC
  Filled 2014-10-02: qty 2

## 2014-10-02 MED ORDER — MIDAZOLAM HCL 5 MG/5ML IJ SOLN
INTRAMUSCULAR | Status: DC | PRN
  Administered 2014-10-02: 2 mg via INTRAVENOUS

## 2014-10-02 MED ORDER — CEFAZOLIN SODIUM-DEXTROSE 2-3 GM-% IV SOLR
2.0000 g | INTRAVENOUS | Status: AC
  Administered 2014-10-02: 2 g via INTRAVENOUS
  Filled 2014-10-02: qty 50

## 2014-10-02 MED ORDER — PROPOFOL 10 MG/ML IV BOLUS
INTRAVENOUS | Status: AC
  Filled 2014-10-02: qty 20

## 2014-10-02 MED ORDER — MIDAZOLAM HCL 2 MG/2ML IJ SOLN
INTRAMUSCULAR | Status: AC
  Filled 2014-10-02: qty 4

## 2014-10-02 MED ORDER — FENTANYL CITRATE (PF) 100 MCG/2ML IJ SOLN
INTRAMUSCULAR | Status: DC | PRN
  Administered 2014-10-02: 50 ug via INTRAVENOUS
  Administered 2014-10-02 (×2): 100 ug via INTRAVENOUS
  Administered 2014-10-02 (×2): 50 ug via INTRAVENOUS
  Administered 2014-10-02: 100 ug via INTRAVENOUS
  Administered 2014-10-02: 50 ug via INTRAVENOUS

## 2014-10-02 MED ORDER — ROCURONIUM BROMIDE 100 MG/10ML IV SOLN
INTRAVENOUS | Status: DC | PRN
  Administered 2014-10-02: 40 mg via INTRAVENOUS

## 2014-10-02 MED ORDER — HYDROMORPHONE HCL 1 MG/ML IJ SOLN
INTRAMUSCULAR | Status: AC
  Filled 2014-10-02: qty 1

## 2014-10-02 MED ORDER — BUPIVACAINE-EPINEPHRINE 0.25% -1:200000 IJ SOLN
INTRAMUSCULAR | Status: DC | PRN
  Administered 2014-10-02: 11 mL

## 2014-10-02 MED ORDER — SUGAMMADEX SODIUM 500 MG/5ML IV SOLN
INTRAVENOUS | Status: DC | PRN
  Administered 2014-10-02: 200 mg via INTRAVENOUS

## 2014-10-02 MED ORDER — LACTATED RINGERS IV SOLN
INTRAVENOUS | Status: DC
  Administered 2014-10-02 (×3): via INTRAVENOUS

## 2014-10-02 MED ORDER — SUGAMMADEX SODIUM 200 MG/2ML IV SOLN
INTRAVENOUS | Status: AC
  Filled 2014-10-02: qty 2

## 2014-10-02 MED ORDER — GLYCOPYRROLATE 0.2 MG/ML IJ SOLN
INTRAMUSCULAR | Status: AC
  Filled 2014-10-02: qty 4

## 2014-10-02 MED ORDER — ROCURONIUM BROMIDE 50 MG/5ML IV SOLN
INTRAVENOUS | Status: AC
  Filled 2014-10-02: qty 1

## 2014-10-02 MED ORDER — BUPIVACAINE-EPINEPHRINE (PF) 0.25% -1:200000 IJ SOLN
INTRAMUSCULAR | Status: AC
  Filled 2014-10-02: qty 30

## 2014-10-02 MED ORDER — FENTANYL CITRATE (PF) 250 MCG/5ML IJ SOLN
INTRAMUSCULAR | Status: AC
  Filled 2014-10-02: qty 5

## 2014-10-02 MED ORDER — LIDOCAINE HCL (CARDIAC) 20 MG/ML IV SOLN
INTRAVENOUS | Status: DC | PRN
  Administered 2014-10-02: 70 mg via INTRAVENOUS

## 2014-10-02 MED ORDER — OXYCODONE-ACETAMINOPHEN 10-325 MG PO TABS: 1.0000 | ORAL_TABLET | Freq: Four times a day (QID) | ORAL | Status: DC | PRN

## 2014-10-02 MED ORDER — PROMETHAZINE HCL 25 MG/ML IJ SOLN: 6.2500 mg | INTRAMUSCULAR | Status: DC | PRN

## 2014-10-02 MED ORDER — MORPHINE SULFATE (PF) 2 MG/ML IV SOLN: 2.0000 mg | INTRAVENOUS | Status: DC | PRN

## 2014-10-02 MED ORDER — DEXAMETHASONE SODIUM PHOSPHATE 10 MG/ML IJ SOLN
INTRAMUSCULAR | Status: DC | PRN
  Administered 2014-10-02: 4 mg via INTRAVENOUS

## 2014-10-02 MED ORDER — OXYCODONE HCL 5 MG PO TABS
5.0000 mg | ORAL_TABLET | ORAL | Status: DC | PRN
  Administered 2014-10-02: 10 mg via ORAL
  Filled 2014-10-02: qty 2

## 2014-10-02 MED ORDER — ONDANSETRON HCL 4 MG/2ML IJ SOLN
INTRAMUSCULAR | Status: DC | PRN
  Administered 2014-10-02: 4 mg via INTRAVENOUS

## 2014-10-02 MED ORDER — KETOROLAC TROMETHAMINE 15 MG/ML IJ SOLN
15.0000 mg | Freq: Four times a day (QID) | INTRAMUSCULAR | Status: DC
  Filled 2014-10-02: qty 1

## 2014-10-02 MED ORDER — LIDOCAINE HCL (CARDIAC) 20 MG/ML IV SOLN
INTRAVENOUS | Status: AC
  Filled 2014-10-02: qty 5

## 2014-10-02 MED ORDER — PROPOFOL 10 MG/ML IV BOLUS
INTRAVENOUS | Status: DC | PRN
  Administered 2014-10-02: 20 mg via INTRAVENOUS
  Administered 2014-10-02: 160 mg via INTRAVENOUS

## 2014-10-02 MED ORDER — NEOSTIGMINE METHYLSULFATE 10 MG/10ML IV SOLN
INTRAVENOUS | Status: AC
  Filled 2014-10-02: qty 1

## 2014-10-02 MED ORDER — SODIUM CHLORIDE 0.9 % IR SOLN
Status: DC | PRN
  Administered 2014-10-02: 1000 mL

## 2014-10-02 MED ORDER — OXYCODONE HCL 5 MG PO TABS
ORAL_TABLET | ORAL | Status: AC
  Filled 2014-10-02: qty 2

## 2014-10-02 SURGICAL SUPPLY — 45 items
APPLIER CLIP 5 13 M/L LIGAMAX5 (MISCELLANEOUS) ×3
BLADE SURG ROTATE 9660 (MISCELLANEOUS) IMPLANT
CANISTER SUCTION 2500CC (MISCELLANEOUS) ×3 IMPLANT
CHLORAPREP W/TINT 26ML (MISCELLANEOUS) ×3 IMPLANT
CLIP APPLIE 5 13 M/L LIGAMAX5 (MISCELLANEOUS) ×1 IMPLANT
CLOSURE STERI-STRIP 1/2X4 (GAUZE/BANDAGES/DRESSINGS) ×1
CLOSURE WOUND 1/2 X4 (GAUZE/BANDAGES/DRESSINGS) ×1
CLSR STERI-STRIP ANTIMIC 1/2X4 (GAUZE/BANDAGES/DRESSINGS) ×2 IMPLANT
COVER SURGICAL LIGHT HANDLE (MISCELLANEOUS) ×3 IMPLANT
DEVICE TROCAR PUNCTURE CLOSURE (ENDOMECHANICALS) ×3 IMPLANT
ELECT REM PT RETURN 9FT ADLT (ELECTROSURGICAL) ×3
ELECTRODE REM PT RTRN 9FT ADLT (ELECTROSURGICAL) ×1 IMPLANT
GLOVE BIO SURGEON STRL SZ7 (GLOVE) ×3 IMPLANT
GLOVE BIO SURGEON STRL SZ8 (GLOVE) ×3 IMPLANT
GLOVE BIOGEL PI IND STRL 7.5 (GLOVE) ×1 IMPLANT
GLOVE BIOGEL PI IND STRL 8 (GLOVE) ×1 IMPLANT
GLOVE BIOGEL PI IND STRL 8.5 (GLOVE) ×1 IMPLANT
GLOVE BIOGEL PI INDICATOR 7.5 (GLOVE) ×2
GLOVE BIOGEL PI INDICATOR 8 (GLOVE) ×2
GLOVE BIOGEL PI INDICATOR 8.5 (GLOVE) ×2
GLOVE SURG SS PI 8.0 STRL IVOR (GLOVE) ×3 IMPLANT
GOWN STRL REUS W/ TWL LRG LVL3 (GOWN DISPOSABLE) ×2 IMPLANT
GOWN STRL REUS W/ TWL XL LVL3 (GOWN DISPOSABLE) ×2 IMPLANT
GOWN STRL REUS W/TWL LRG LVL3 (GOWN DISPOSABLE) ×4
GOWN STRL REUS W/TWL XL LVL3 (GOWN DISPOSABLE) ×4
KIT BASIN OR (CUSTOM PROCEDURE TRAY) ×3 IMPLANT
KIT ROOM TURNOVER OR (KITS) ×3 IMPLANT
LIQUID BAND (GAUZE/BANDAGES/DRESSINGS) ×3 IMPLANT
NS IRRIG 1000ML POUR BTL (IV SOLUTION) IMPLANT
PAD ARMBOARD 7.5X6 YLW CONV (MISCELLANEOUS) ×3 IMPLANT
POUCH RETRIEVAL ECOSAC 10 (ENDOMECHANICALS) ×1 IMPLANT
POUCH RETRIEVAL ECOSAC 10MM (ENDOMECHANICALS) ×2
SCISSORS LAP 5X35 DISP (ENDOMECHANICALS) ×3 IMPLANT
SET IRRIG TUBING LAPAROSCOPIC (IRRIGATION / IRRIGATOR) ×3 IMPLANT
SLEEVE ENDOPATH XCEL 5M (ENDOMECHANICALS) ×6 IMPLANT
SPECIMEN JAR SMALL (MISCELLANEOUS) ×3 IMPLANT
STRIP CLOSURE SKIN 1/2X4 (GAUZE/BANDAGES/DRESSINGS) ×2 IMPLANT
SUT MNCRL AB 4-0 PS2 18 (SUTURE) ×3 IMPLANT
SUT VICRYL 0 UR6 27IN ABS (SUTURE) ×6 IMPLANT
TOWEL OR 17X24 6PK STRL BLUE (TOWEL DISPOSABLE) ×3 IMPLANT
TOWEL OR 17X26 10 PK STRL BLUE (TOWEL DISPOSABLE) ×3 IMPLANT
TRAY LAPAROSCOPIC MC (CUSTOM PROCEDURE TRAY) ×3 IMPLANT
TROCAR XCEL BLUNT TIP 100MML (ENDOMECHANICALS) ×3 IMPLANT
TROCAR XCEL NON-BLD 5MMX100MML (ENDOMECHANICALS) ×3 IMPLANT
TUBING INSUFFLATION (TUBING) ×3 IMPLANT

## 2014-10-02 NOTE — Anesthesia Procedure Notes (Signed)
Procedure Name: Intubation Date/Time: 10/02/2014 1:22 PM Performed by: Ignacia Bayley Pre-anesthesia Checklist: Patient identified, Emergency Drugs available, Suction available and Patient being monitored Patient Re-evaluated:Patient Re-evaluated prior to inductionOxygen Delivery Method: Circle system utilized Preoxygenation: Pre-oxygenation with 100% oxygen Intubation Type: IV induction Ventilation: Mask ventilation without difficulty Laryngoscope Size: Miller and 2 Grade View: Grade II Tube type: Oral Tube size: 7.0 mm Number of attempts: 1 Airway Equipment and Method: Stylet Placement Confirmation: ETT inserted through vocal cords under direct vision,  positive ETCO2,  CO2 detector and breath sounds checked- equal and bilateral Secured at: 20 cm Tube secured with: Tape Dental Injury: Teeth and Oropharynx as per pre-operative assessment

## 2014-10-02 NOTE — Interval H&P Note (Signed)
History and Physical Interval Note:  10/02/2014 1:01 PM  Kathy Ewing  has presented today for surgery, with the diagnosis of gallstones  The various methods of treatment have been discussed with the patient and family. After consideration of risks, benefits and other options for treatment, the patient has consented to  Procedure(s): LAPAROSCOPIC CHOLECYSTECTOMY (N/A) as a surgical intervention .  The patient's history has been reviewed, patient examined, no change in status, stable for surgery.  I have reviewed the patient's chart and labs.  Questions were answered to the patient's satisfaction.     Zakary Kimura

## 2014-10-02 NOTE — Anesthesia Preprocedure Evaluation (Signed)
Anesthesia Evaluation  Patient identified by MRN, date of birth, ID band Patient awake    Reviewed: Allergy & Precautions, NPO status , Patient's Chart, lab work & pertinent test results  Airway Mallampati: II  TM Distance: >3 FB Neck ROM: Full    Dental   Pulmonary Current Smoker,    breath sounds clear to auscultation       Cardiovascular negative cardio ROS   Rhythm:Regular Rate:Normal     Neuro/Psych    GI/Hepatic Neg liver ROS, GERD  ,  Endo/Other  negative endocrine ROS  Renal/GU negative Renal ROS     Musculoskeletal   Abdominal   Peds  Hematology   Anesthesia Other Findings   Reproductive/Obstetrics                             Anesthesia Physical Anesthesia Plan  ASA: III  Anesthesia Plan: General   Post-op Pain Management:    Induction: Intravenous  Airway Management Planned: Oral ETT  Additional Equipment:   Intra-op Plan:   Post-operative Plan: Extubation in OR  Informed Consent: I have reviewed the patients History and Physical, chart, labs and discussed the procedure including the risks, benefits and alternatives for the proposed anesthesia with the patient or authorized representative who has indicated his/her understanding and acceptance.   Dental advisory given  Plan Discussed with: CRNA and Anesthesiologist  Anesthesia Plan Comments:         Anesthesia Quick Evaluation

## 2014-10-02 NOTE — H&P (Signed)
44 yof with a couple months of worsening ruq pain. this began as intermittent but is now occurring daily. this radiates to right shoulder and to her back. there is some chest pain associated with this at times. she has some nausea, no emesis. is having loose stools. no fevers. has changed diet as some foods bother her (although this is somewhat due to cholesterol). she underwent US that shows 2 cm gallstone and is here to discuss lap chole  Other Problems Marjean Donna, [place]; 09/30/2014 3:17 PM) Anxiety Disorder Back Pain Chest pain Gastroesophageal Reflux Disease Hypercholesterolemia Migraine Headache  Past Surgical History Marjean Donna, CMA; 09/30/2014 3:17 PM) 42arean Section - Multiple  Diagnostic Studies History Marjean Donna, CMA; 09/30/2014 3:17 PM) Colonoscopy never Mammogram never Pap Smear 1-5 years ago  Allergies Davy Pique Bynum, CMA; 09/30/2014 3:20 PM) No Known Drug Allergies09/13/2016  Medication History (Sonya Bynum, CMA; 09/30/2014 3:21 PM) ALPRAZolam (0.5MG  Tablet, Oral) Active. Atorvastatin Calcium (40MG  Tablet, Oral) Active. Naproxen (500MG  Tablet, Oral) Active. Omeprazole (20MG  Capsule DR, Oral) Active. Pravastatin Sodium (40MG  Tablet, Oral) Active. SUMAtriptan Succinate (100MG  Tablet, Oral) Active. Topiramate (25MG  Tablet, Oral) Active. Amitriptyline HCl (10MG  Tablet, Oral) Active. Metoclopramide HCl (5MG  Tablet, Oral) Active. Vitamin D (Ergocalciferol) (50000UNIT Capsule, Oral) Active. Topiramate (50MG  Tablet, Oral) Active. MetroNIDAZOLE (500MG  Tablet, Oral) Active. Medications Reconciled  Social History Marjean Donna, CMA; 09/30/2014 3:17 PM) Alcohol use Remotely quit alcohol use. No caffeine use No drug use Tobacco use Current every day smoker.  Family History Marjean Donna, [place]; 09/30/2014 3:17 PM) Arthritis Brother, Father. Cancer Family Members In General. Cerebrovascular Accident Family Members In General. Cervical Cancer  Sister. Colon Polyps Sister. Depression Sister. Diabetes Mellitus Family Members In General. Heart Disease Family Members In General. Hypertension Mother. Migraine Headache Brother, Family Members In [place], Sister. Thyroid problems Mother.  Pregnancy / Birth History Marjean Donna, [place]; 09/30/2014 3:17 PM) Age at menarche 51 years. Gravida 4 Maternal age 47-25 Para 2 Regular periods  Review of Systems ([place]; 09/30/2014 3:17 PM) General Present- Weight Gain. Not Present- Appetite Loss, Chills, Fatigue, Fever, Night Sweats and Weight Loss. Skin Not Present- Change in Wart/Mole, Dryness, Hives, Jaundice, New Lesions, Non-Healing Wounds, Rash and Ulcer. HEENT Not Present- Earache, Hearing Loss, Hoarseness, Nose Bleed, Oral Ulcers, Ringing in the Ears, Seasonal Allergies, Sinus Pain, Sore Throat, Visual Disturbances, Wears glasses/contact lenses and Yellow Eyes. Respiratory Present- Difficulty Breathing and Snoring. Not Present- Bloody sputum, Chronic Cough and Wheezing. Breast Not Present- Breast Mass, Breast Pain, Nipple Discharge and Skin Changes. Cardiovascular Present- Chest Pain, Difficulty Breathing Lying Down, Leg Cramps, Palpitations, Rapid Heart Rate, Shortness of Breath and Swelling of Extremities. Gastrointestinal Present- Change in Bowel Habits and Nausea. Not Present- Abdominal Pain, Bloating, Bloody Stool, Chronic diarrhea, Constipation, Difficulty Swallowing, Excessive gas, Gets full quickly at meals, Hemorrhoids, Indigestion, Rectal Pain and Vomiting. Female Genitourinary Present- Frequency and Nocturia. Not Present- Painful Urination, Pelvic Pain and Urgency. Musculoskeletal Present- Back Pain, Joint Pain, Joint Stiffness and Muscle Pain. Not Present- Muscle Weakness and Swelling of Extremities. Neurological Present- Headaches, Numbness, Tingling and Trouble walking. Not Present- Decreased Memory, Fainting, Seizures, Tremor and Weakness. Psychiatric  Present- Anxiety and Change in Sleep Pattern. Not Present- Bipolar, Depression, Fearful and Frequent crying. Endocrine Not Present- Cold Intolerance, Excessive Hunger, Hair Changes, Heat Intolerance, Hot flashes and New Diabetes. Hematology Present- Easy Bruising. Not Present- Excessive bleeding, Gland problems, HIV and Persistent Infections.   Vitals (Sonya Bynum CMA; 09/30/2014 3:20 PM) 09/30/2014 3:19 PM Weight: 184.4 lb Height: 66in Body  Surface Area: 1.97 m Body Mass Index: 29.76 kg/m Temp.: 55F(Temporal)  Pulse: 77 (Regular)  BP: 124/80 (Sitting, Left Arm, Standard)  Physical Exam Rolm Bookbinder MD; 09/30/2014 3:45 PM) General Mental Status-Alert. Orientation-Oriented X3. Eye Sclera/Conjunctiva - Bilateral-No scleral icterus. Chest and Lung Exam Chest and lung exam reveals -on auscultation, normal breath sounds, no adventitious sounds and normal vocal resonance. Cardiovascular Cardiovascular examination reveals -normal heart sounds, regular rate and rhythm with no murmurs. Abdomen Note: tender ruq on exam, nondistended bs present  Assessment & Plan Rolm Bookbinder MD; 09/30/2014 3:38 PM) GALLSTONES (K80.20) plan for lap chole  I discussed the procedure in detail. The patient was given Neurosurgeon. We discussed the risks and benefits of a laparoscopic cholecystectomy and possible cholangiogram including, but not limited to bleeding, infection, injury to surrounding structures such as the intestine or liver, bile leak, retained gallstones, need to convert to an open procedure, prolonged diarrhea, blood clots such as DVT, common bile duct injury, anesthesia risks, and possible need for additional procedures. The likelihood of improvement in symptoms and return to the patient's normal status is good. We discussed the typical post-operative recovery course.

## 2014-10-02 NOTE — Op Note (Signed)
Preoperative diagnosis: gallstones Postoperative diagnosis: cholecystitis Procedure: laparoscopic cholecystectomy  Surgeon: Dr Serita Grammes Anesthesia: general EBL: minimal Drains none Specimen gb and contents to pathology Complications: none Sponge count correct at completion Disposition to recovery stable  Indications: This is an 20 yof with gallstones and symptoms related to them.  We discussed proceeding with lap chole.   Procedure: After informed consent was obtained the patient was taken to the operating room. She was already given antibiotics. Sequential compression devices were on her legs. She was placed under general anesthesia without complication. Her abdomen was prepped and draped in the standard sterile surgical fashion. A surgical timeout was then performed.  I infiltrated marcaine below the umbilicus. I made an incision and then entered the fascia sharply. I then entered the peritoneum bluntly. There was no evidence of an entry injury. I placed a 0 vicryl pursestring suture and inserted a hasson trocar. I then inserted 3 further 5 mm trocars in the epigastrium and ruq.  She did appear to have cholecystitis.  Iwas able to retract the gallbladder cephalad and lateral.  Eventually I was able to identify the cystic duct and clearly had the critical view of safety. I then clipped the cystic duct and divided it. The duct was viable and the clips traversed the duct.  I then clipped the cystic artery and divided it. I then removed the gallbladder from the liver bed and placed it in a bag. It was then removed from the umbilical incision. I then obtained hemostasis and irrigated. I then removed the umbilical trocar and closed with 0 vicryl and the endoclose device after tying down the pursestring.  I then desufflated the abdomen and removed all my remaining trocars. I then closed these with 4-0 Monocryl and Dermabond. She tolerated this well was extubated and transferred to  the recovery room in stable condition

## 2014-10-02 NOTE — Discharge Instructions (Signed)
[facility] LAPAROSCOPIC SURGERY: POST OP INSTRUCTIONS  Always review your discharge instruction sheet given to you by the facility where your surgery was performed. IF YOU HAVE DISABILITY OR FAMILY LEAVE FORMS, YOU MUST BRING THEM TO THE OFFICE FOR PROCESSING.   DO NOT GIVE THEM TO YOUR DOCTOR.  1. A prescription for pain medication may be given to you upon discharge.  Take your pain medication as prescribed, if needed.  If narcotic pain medicine is not needed, then you may take acetaminophen (Tylenol), naprosyn (Alleve), or ibuprofen (Advil) as needed. 2. Take your usually prescribed medications unless otherwise directed. 3. If you need a refill on your pain medication, please contact your pharmacy.  They will contact our office to request authorization. Prescriptions will not be filled after 5pm or on week-ends. 4. You should follow a light diet the first few days after arrival home, such as soup and crackers, etc.  Be sure to include lots of fluids daily. 5. Most patients will experience some swelling and bruising in the area of the incisions.  Ice packs will help.  Swelling and bruising can take several days to resolve.  6. It is common to experience some constipation if taking pain medication after surgery.  Increasing fluid intake and taking a stool softener (such as Colace) will usually help or prevent this problem from occurring.  A mild laxative (Milk of Magnesia or Miralax) should be taken according to package instructions if there are no bowel movements after 48 hours. 7. Unless discharge instructions indicate otherwise, you may remove your bandages 48 hours after surgery, and you may shower at that time.  You may have steri-strips (small skin tapes) in place directly over the incision.  These strips should be left on the skin for 7-10 days.  If your surgeon used skin glue on the incision, you may shower in 24 hours.  The glue will flake  off over the next 2-3 weeks.  Any sutures or staples will be removed at the office during your follow-up visit. 8. ACTIVITIES:  You may resume regular (light) daily activities beginning the next day--such as daily self-care, walking, climbing stairs--gradually increasing activities as tolerated.  You may have sexual intercourse when it is comfortable.  Refrain from any heavy lifting or straining until approved by your doctor. a. You may drive when you are no longer taking prescription pain medication, you can comfortably wear a seatbelt, and you can safely maneuver your car and apply brakes. b. RETURN TO WORK:  __________________________________________________________ 9. You should see your doctor in the office for a follow-up appointment approximately 2-3 weeks after your surgery.  Make sure that you call for this appointment within a day or two after you arrive home to insure a convenient appointment time. 10. OTHER INSTRUCTIONS: __________________________________________________________________________________________________________________________ __________________________________________________________________________________________________________________________ WHEN TO CALL YOUR DOCTOR: 1. Fever over 101.0 2. Inability to urinate 3. Continued bleeding from incision. 4. Increased pain, redness, or drainage from the incision. 5. Increasing abdominal pain  The clinic staff is available to answer your questions during regular business hours.  Please don't hesitate to call and ask to speak to one of the nurses for clinical concerns.  If you have a medical emergency, go to the nearest emergency room or call 911.  A surgeon from [facility] is always on call at the hospital. [address], [address], [place], [place]  [address] ? [address] [address], [place], [place]   [address] [phone] ? [phone] ? [phone]   [phone] Web site: [email or web address]  

## 2014-10-02 NOTE — Transfer of Care (Signed)
Immediate Anesthesia Transfer of Care Note  Patient: Kathy Ewing  Procedure(s) Performed: Procedure(s): LAPAROSCOPIC CHOLECYSTECTOMY (N/A)  Patient Location: PACU  Anesthesia Type:General  Level of Consciousness: awake  Airway & Oxygen Therapy: Patient Spontanous Breathing and Patient connected to nasal cannula oxygen  Post-op Assessment: Report given to RN and Post -op Vital signs reviewed and stable  Post vital signs: stable  Last Vitals:  Filed Vitals:   10/02/14 1444  BP: 118/81  Pulse:   Temp: 36.4 C  Resp: 15    Complications: No apparent anesthesia complications

## 2014-10-03 ENCOUNTER — Encounter (HOSPITAL_COMMUNITY): Payer: Self-pay | Admitting: General Surgery

## 2014-10-03 NOTE — Anesthesia Postprocedure Evaluation (Signed)
  Anesthesia Post-op Note  Patient: Kathy Ewing  Procedure(s) Performed: Procedure(s): LAPAROSCOPIC CHOLECYSTECTOMY (N/A)  Patient Location: PACU  Anesthesia Type:General  Level of Consciousness: awake  Airway and Oxygen Therapy: Patient Spontanous Breathing  Post-op Pain: mild  Post-op Assessment: Post-op Vital signs reviewed              Post-op Vital Signs: Reviewed  Last Vitals:  Filed Vitals:   10/02/14 1621  BP: 121/62  Pulse: 59  Temp: 36.4 C  Resp: 17    Complications: No apparent anesthesia complications

## 2014-10-08 ENCOUNTER — Other Ambulatory Visit: Payer: Self-pay | Admitting: Family

## 2014-10-08 MED ORDER — ALPRAZOLAM 0.5 MG PO TABS: ORAL_TABLET | ORAL | Status: DC

## 2014-10-08 NOTE — Progress Notes (Signed)
Called xanax to [facility] in [place]. Patient notified. Also made hospital follow up appt

## 2014-10-13 ENCOUNTER — Ambulatory Visit (INDEPENDENT_AMBULATORY_CARE_PROVIDER_SITE_OTHER): Payer: 59 | Admitting: Family Medicine

## 2014-10-13 DIAGNOSIS — R002 Palpitations: Secondary | ICD-10-CM

## 2014-10-13 NOTE — Progress Notes (Signed)
Subjective:  Patient ID: Kathy Ewing, female    DOB: 19-Dec-1970  Age: 43 y.o. MRN: [id number]  CC: Follow-up   HPI Kathy Ewing presents for chest pain and light headedness. Heart flutters when laying down. Tingling  Too much. Wants to discontinue topiramate. Potassium low. Irregular heart beat. It is unclear whether the chest pain and lightheadedness are associated with the irregular heartbeat. History Kathy Ewing has a past medical history of B12 deficiency; Vitamin D deficiency; Anxiety; Numbness; Headache; Pneumonia; GERD (gastroesophageal reflux disease); and High cholesterol.   She has past surgical history that includes Cesarean section and Cholecystectomy (N/A, 10/02/2014).   Her family history includes Anxiety disorder in her brother; Cerebral palsy in her daughter; Depression in her daughter; Hypertension in her mother; Migraines in her sister; Multiple sclerosis in her sister.She reports that she has been smoking Cigarettes.  She has a 15 pack-year smoking history. She has never used smokeless tobacco. She reports that she does not drink alcohol or use illicit drugs.  Outpatient Prescriptions Prior to Visit  Medication Sig Dispense Refill  . ALPRAZolam (XANAX) 0.5 MG tablet TAKE 1 TO 2 TABLETS BY MOUTH TWICE DAILYAS NEEDED FOR ANXIETY 60 tablet 0  . atorvastatin (LIPITOR) 40 MG tablet Take 1 tablet (40 mg total) by mouth daily. (Patient taking differently: Take 40 mg by mouth every evening. ) 90 tablet 1  . frovatriptan (FROVA) 2.5 MG tablet Take 1 tablet (2.5 mg total) by mouth as needed for migraine. If recurs, may repeat after 2 hours. Max of 3 tabs in 24 hours. 10 tablet 0  . metoCLOPramide (REGLAN) 5 MG tablet Take one as needed at the onset of HA (Patient taking differently: Take 5 mg by mouth as needed (headache). Take one as needed at the onset of HA) 30 tablet 5  . naproxen (NAPROSYN) 500 MG tablet Take one tablet for severe headache, ok to repeat in 12 hours.  Do not take  more than twice per week. (Patient taking differently: Take 500 mg by mouth 2 (two) times daily as needed for headache. Take one tablet for severe headache, ok to repeat in 12 hours.  Do not take more than twice per week.) 20 tablet 2  . omeprazole (PRILOSEC) 20 MG capsule Take 1 capsule (20 mg total) by mouth daily. 30 capsule 11  . terbinafine (LAMISIL) 250 MG tablet Take 1 tablet (250 mg total) by mouth daily. 90 tablet 0  . Vitamin D, Ergocalciferol, (DRISDOL) 50000 UNITS CAPS capsule Take 50,000 Units by mouth every 7 (seven) days.    [name] Kitchen topiramate (TOPAMAX) 50 MG tablet Three daily for one week then four daily. (Patient taking differently: Take 150-200 mg by mouth daily. Three daily for one week then four daily.) 120 tablet 2  . oxyCODONE-acetaminophen (PERCOCET) 10-325 MG per tablet Take 1 tablet by mouth every 6 (six) hours as needed for pain. (Patient not taking: Reported on 10/13/2014) 20 tablet 0  . traMADol (ULTRAM) 50 MG tablet Take 1 tablet (50 mg total) by mouth every 8 (eight) hours as needed. (Patient not taking: Reported on 10/13/2014) 30 tablet 0  . metroNIDAZOLE (FLAGYL) 500 MG tablet Take 1 tablet (500 mg total) by mouth 2 (two) times daily. (Patient not taking: Reported on 10/13/2014) 14 tablet 0   Facility-Administered Medications Prior to Visit  Medication Dose Route Frequency Provider Last Rate Last Dose  . cyanocobalamin ((VITAMIN B-12)) injection 1,000 mcg  1,000 mcg Intramuscular Q30 days Claretta Fraise, MD   1,000  mcg at 09/15/14 1113    ROS Review of Systems  Constitutional: Negative for fever, activity change and appetite change.  HENT: Negative for congestion, rhinorrhea and sore throat.   Eyes: Negative for pain and visual disturbance.  Respiratory: Negative for cough and shortness of breath.   Gastrointestinal: Negative for nausea and abdominal pain.  Musculoskeletal: Negative for myalgias and arthralgias.    Objective:  LMP 09/23/2014  BP Readings from Last 3  Encounters:  10/02/14 121/62  09/15/14 98/75  08/14/14 107/67    Wt Readings from Last 3 Encounters:  10/02/14 185 lb (83.915 kg)  09/15/14 185 lb 9.6 oz (84.188 kg)  08/14/14 198 lb 3.2 oz (89.903 kg)     Physical Exam  Constitutional: She is oriented to person, place, and time. She appears well-developed and well-nourished. No distress.  HENT:  Head: Normocephalic and atraumatic.  Eyes: Conjunctivae are normal. Pupils are equal, round, and reactive to light.  Neck: Normal range of motion. Neck supple. No thyromegaly present.  Cardiovascular: Normal rate, regular rhythm and normal heart sounds.   No murmur heard. Pulmonary/Chest: Effort normal and breath sounds normal. No respiratory distress. She has no wheezes. She has no rales.  Abdominal: Soft. Bowel sounds are normal. She exhibits no distension. There is no tenderness.  Musculoskeletal: Normal range of motion.  Lymphadenopathy:    She has no cervical adenopathy.  Neurological: She is alert and oriented to person, place, and time.  Skin: Skin is warm and dry.  Psychiatric: She has a normal mood and affect. Her behavior is normal. Judgment and thought content normal.    No results found for: HGBA1C  Lab Results  Component Value Date   WBC 7.5 10/02/2014   HGB 13.3 10/02/2014   HCT 40.6 10/02/2014   PLT 297 10/02/2014   GLUCOSE 89 10/02/2014   CHOL 100 09/11/2014   TRIG 62 09/11/2014   HDL 41 09/11/2014   LDLCALC 47 09/11/2014   ALT 18 10/02/2014   AST 17 10/02/2014   NA 142 10/02/2014   K 4.0 10/02/2014   CL 115* 10/02/2014   CREATININE 0.80 10/02/2014   BUN 10 10/02/2014   CO2 21* 10/02/2014   TSH 3.180 02/19/2014    No results found.  Assessment & Plan:   Kathy Ewing was seen today for follow-up.  Diagnoses and all orders for this visit:  Palpitations -     Holter monitor - 48 hour; Future   I have discontinued Kathy Ewing's topiramate and metroNIDAZOLE. I am also having her maintain her omeprazole,  naproxen, traMADol, frovatriptan, metoCLOPramide, atorvastatin, terbinafine, Vitamin D (Ergocalciferol), oxyCODONE-acetaminophen, and ALPRAZolam. We will continue to administer cyanocobalamin.  No orders of the defined types were placed in this encounter.   Taper topiramate to two every evening for three nights. Then taper to one each evening for three nights. Then discontinue the med.  Follow-up: Return in about 1 month (around 11/12/2014).  Claretta Fraise, M.D.

## 2014-10-13 NOTE — Patient Instructions (Signed)
Taper topiramate to two every evening for three nights. Then taper to one each evening for three nights. Then discontinue the med.

## 2014-10-20 ENCOUNTER — Other Ambulatory Visit: Payer: Self-pay | Admitting: General Surgery

## 2014-10-20 DIAGNOSIS — R1030 Lower abdominal pain, unspecified: Secondary | ICD-10-CM

## 2014-10-20 DIAGNOSIS — Z9049 Acquired absence of other specified parts of digestive tract: Secondary | ICD-10-CM

## 2014-10-22 ENCOUNTER — Other Ambulatory Visit: Payer: 59

## 2014-10-24 ENCOUNTER — Ambulatory Visit: Payer: 59 | Admitting: Neurology

## 2014-10-28 ENCOUNTER — Ambulatory Visit
Admission: RE | Admit: 2014-10-28 | Discharge: 2014-10-28 | Disposition: A | Discharge status: Home / Self Care | Payer: 59 | Source: Ambulatory Visit | Attending: General Surgery | Admitting: General Surgery

## 2014-10-28 MED ORDER — IOPAMIDOL (ISOVUE-300) INJECTION 61%
100.0000 mL | Freq: Once | INTRAVENOUS | Status: AC | PRN
  Administered 2014-10-28: 100 mL via INTRAVENOUS

## 2014-10-28 MED ORDER — IOHEXOL 300 MG/ML  SOLN: 100.0000 mL | Freq: Once | INTRAMUSCULAR | Status: DC | PRN

## 2014-10-29 ENCOUNTER — Ambulatory Visit (INDEPENDENT_AMBULATORY_CARE_PROVIDER_SITE_OTHER): Payer: 59 | Admitting: Family Medicine

## 2014-10-29 ENCOUNTER — Encounter: Payer: Self-pay | Admitting: Family Medicine

## 2014-10-29 VITALS — BP 120/70 | HR 73 | Temp 97.0°F | Ht 66.0 in | Wt 186.4 lb

## 2014-10-29 DIAGNOSIS — R1032 Left lower quadrant pain: Secondary | ICD-10-CM | POA: Diagnosis not present

## 2014-10-29 DIAGNOSIS — K5733 Diverticulitis of large intestine without perforation or abscess with bleeding: Secondary | ICD-10-CM | POA: Diagnosis not present

## 2014-10-29 DIAGNOSIS — R0789 Other chest pain: Secondary | ICD-10-CM | POA: Diagnosis not present

## 2014-10-29 LAB — POCT URINALYSIS DIPSTICK
BILIRUBIN UA: NEGATIVE
Blood, UA: NEGATIVE
Glucose, UA: NEGATIVE
KETONES UA: NEGATIVE
LEUKOCYTES UA: NEGATIVE
Nitrite, UA: NEGATIVE
PH UA: 7.5
Protein, UA: NEGATIVE
SPEC GRAV UA: 1.01
Urobilinogen, UA: NEGATIVE

## 2014-10-29 LAB — POCT UA - MICROSCOPIC ONLY
CASTS, UR, LPF, POC: NEGATIVE
CRYSTALS, UR, HPF, POC: NEGATIVE
MUCUS UA: NEGATIVE
RBC, urine, microscopic: NEGATIVE
WBC, Ur, HPF, POC: NEGATIVE
YEAST UA: NEGATIVE

## 2014-10-29 MED ORDER — METRONIDAZOLE 500 MG PO TABS: 500.0000 mg | ORAL_TABLET | Freq: Three times a day (TID) | ORAL | Status: DC

## 2014-10-29 MED ORDER — NABUMETONE 500 MG PO TABS: 1000.0000 mg | ORAL_TABLET | Freq: Two times a day (BID) | ORAL | Status: DC

## 2014-10-29 MED ORDER — CIPROFLOXACIN HCL 500 MG PO TABS: 500.0000 mg | ORAL_TABLET | Freq: Two times a day (BID) | ORAL | Status: DC

## 2014-10-29 NOTE — Progress Notes (Signed)
Subjective:  Patient ID: Kathy Ewing, female    DOB: 1970/04/09  Age: 44 y.o. MRN: [id number]  CC: No chief complaint on file.   HPI Kathy Ewing presents for abd pain. Chst pain continues as well. Tingling gone with DC of topiramate the abdominal pain today is primarily in the left lower quadrant. It has been present intermittently since her gallbladder surgery. She feels that something must have happened at the surgery to cause it since it is temporally related. She does have some problems with her bowels and she passes blood and has diarrhea still at times. Bowel movements are occasionally frequent four - five a day and sometimes watery. There is no upper abdominal pain noted at this time. No vomiting or nausea.  History Kathy Ewing has a past medical history of B12 deficiency; Vitamin D deficiency; Anxiety; Numbness; Headache; Pneumonia; GERD (gastroesophageal reflux disease); and High cholesterol.   She has past surgical history that includes Cesarean section and Cholecystectomy (N/A, 10/02/2014).   Her family history includes Anxiety disorder in her brother; Cerebral palsy in her daughter; Depression in her daughter; Hypertension in her mother; Migraines in her sister; Multiple sclerosis in her sister.She reports that she has been smoking Cigarettes.  She has a 15 pack-year smoking history. She has never used smokeless tobacco. She reports that she does not drink alcohol or use illicit drugs.  Outpatient Prescriptions Prior to Visit  Medication Sig Dispense Refill  . ALPRAZolam (XANAX) 0.5 MG tablet TAKE 1 TO 2 TABLETS BY MOUTH TWICE DAILYAS NEEDED FOR ANXIETY 60 tablet 0  . atorvastatin (LIPITOR) 40 MG tablet Take 1 tablet (40 mg total) by mouth daily. (Patient taking differently: Take 40 mg by mouth every evening. ) 90 tablet 1  . frovatriptan (FROVA) 2.5 MG tablet Take 1 tablet (2.5 mg total) by mouth as needed for migraine. If recurs, may repeat after 2 hours. Max of 3 tabs in 24  hours. 10 tablet 0  . metoCLOPramide (REGLAN) 5 MG tablet Take one as needed at the onset of HA (Patient taking differently: Take 5 mg by mouth as needed (headache). Take one as needed at the onset of HA) 30 tablet 5  . omeprazole (PRILOSEC) 20 MG capsule Take 1 capsule (20 mg total) by mouth daily. 30 capsule 11  . terbinafine (LAMISIL) 250 MG tablet Take 1 tablet (250 mg total) by mouth daily. 90 tablet 0  . Vitamin D, Ergocalciferol, (DRISDOL) 50000 UNITS CAPS capsule Take 50,000 Units by mouth every 7 (seven) days.    . naproxen (NAPROSYN) 500 MG tablet Take one tablet for severe headache, ok to repeat in 12 hours.  Do not take more than twice per week. (Patient taking differently: Take 500 mg by mouth 2 (two) times daily as needed for headache. Take one tablet for severe headache, ok to repeat in 12 hours.  Do not take more than twice per week.) 20 tablet 2  . oxyCODONE-acetaminophen (PERCOCET) 10-325 MG per tablet Take 1 tablet by mouth every 6 (six) hours as needed for pain. (Patient not taking: Reported on 10/13/2014) 20 tablet 0  . traMADol (ULTRAM) 50 MG tablet Take 1 tablet (50 mg total) by mouth every 8 (eight) hours as needed. (Patient not taking: Reported on 10/13/2014) 30 tablet 0   Facility-Administered Medications Prior to Visit  Medication Dose Route Frequency Provider Last Rate Last Dose  . cyanocobalamin ((VITAMIN B-12)) injection 1,000 mcg  1,000 mcg Intramuscular Q30 days Claretta Fraise, MD   1,000  mcg at 09/15/14 1113    ROS Review of Systems  Constitutional: Positive for activity change (decreased). Negative for chills, diaphoresis and fatigue.  HENT: Negative for congestion and rhinorrhea.   Respiratory: Negative for cough and shortness of breath.   Cardiovascular: Negative for chest pain and palpitations.  Gastrointestinal: Positive for abdominal pain, diarrhea, blood in stool and abdominal distention. Negative for nausea, vomiting and constipation.  Genitourinary:  Negative for dysuria and frequency.  Musculoskeletal: Negative for myalgias and arthralgias.  Skin: Negative for rash and wound.  All other systems reviewed and are negative.   Objective:  BP 120/70 mmHg  Pulse 73  Temp(Src) 97 F (36.1 C) (Oral)  Ht 5' 6"  (1.676 m)  Wt 186 lb 6.4 oz (84.55 kg)  BMI 30.10 kg/m2  LMP 09/23/2014  BP Readings from Last 3 Encounters:  10/29/14 120/70  10/02/14 121/62  09/15/14 98/75    Wt Readings from Last 3 Encounters:  10/29/14 186 lb 6.4 oz (84.55 kg)  10/02/14 185 lb (83.915 kg)  09/15/14 185 lb 9.6 oz (84.188 kg)     Physical Exam  Constitutional: She is oriented to person, place, and time. She appears well-developed and well-nourished.  HENT:  Head: Normocephalic and atraumatic.  Cardiovascular: Normal rate and regular rhythm.   No murmur heard. Pulmonary/Chest: Effort normal and breath sounds normal.  Abdominal: Soft. Bowel sounds are normal. She exhibits no mass. There is tenderness (moderate left lower quadrant). There is no rebound and no guarding.  Neurological: She is alert and oriented to person, place, and time.  Skin: Skin is warm and dry.  Psychiatric: She has a normal mood and affect. Her behavior is normal.    No results found for: HGBA1C  Lab Results  Component Value Date   WBC 7.9 10/29/2014   HGB 13.3 10/02/2014   HCT 37.6 10/29/2014   PLT 297 10/02/2014   GLUCOSE 86 10/29/2014   CHOL 100 09/11/2014   TRIG 62 09/11/2014   HDL 41 09/11/2014   LDLCALC 47 09/11/2014   ALT 7 10/29/2014   AST 7 10/29/2014   NA 144 10/29/2014   K 3.8 10/29/2014   CL 105 10/29/2014   CREATININE 0.71 10/29/2014   BUN 7 10/29/2014   CO2 24 10/29/2014   TSH 3.180 02/19/2014    Ct Abdomen Pelvis W Contrast  10/28/2014  CLINICAL DATA:  Lower abdominal pain, nausea. Prior cholecystectomy 10/02/2014. EXAM: CT ABDOMEN AND PELVIS WITH CONTRAST TECHNIQUE: Multidetector CT imaging of the abdomen and pelvis was performed using the  standard protocol following bolus administration of intravenous contrast. CONTRAST:  1 OMNIPAQUE IOHEXOL 300 MG/ML SOLN, 158m ISOVUE-300 IOPAMIDOL (ISOVUE-300) INJECTION 61% COMPARISON:  Ultrasound 09/15/2014 FINDINGS: Lung bases are clear.  No effusions.  Heart is normal size. Small cysts scattered throughout the liver which appear benign. Prior cholecystectomy. Spleen, pancreas, adrenals and kidneys are normal. Bowel grossly unremarkable. No free fluid, free air, or adenopathy. Appendix is visualized and is normal. Uterus, adnexae and urinary bladder normal. No acute bony abnormality or focal bone lesion. IMPRESSION: No acute findings in the abdomen or pelvis. No complicating feature from recent cholecystectomy. Scattered small benign-appearing hepatic cysts. Electronically Signed   By: KRolm BaptiseM.D.   On: 10/28/2014 10:37    Assessment & Plan:   Diagnoses and all orders for this visit:  Left lower quadrant pain -     POCT urinalysis dipstick -     POCT UA - Microscopic Only -     CBC  with Differential/Platelet -     CMP14+EGFR -     Amylase -     Lipase -     Ambulatory referral to Gastroenterology  Diverticulitis of large intestine without perforation or abscess with bleeding -     CBC with Differential/Platelet -     CMP14+EGFR -     Amylase -     Lipase -     Ambulatory referral to Gastroenterology  Other chest pain  Other orders -     ciprofloxacin (CIPRO) 500 MG tablet; Take 1 tablet (500 mg total) by mouth 2 (two) times daily. -     metroNIDAZOLE (FLAGYL) 500 MG tablet; Take 1 tablet (500 mg total) by mouth 3 (three) times daily. For infection. Take the entire course -     nabumetone (RELAFEN) 500 MG tablet; Take 2 tablets (1,000 mg total) by mouth 2 (two) times daily. For muscle and joint pain   I have discontinued Ms. Luis's naproxen, traMADol, and oxyCODONE-acetaminophen. I am also having her start on ciprofloxacin, metroNIDAZOLE, and nabumetone. Additionally, I am  having her maintain her omeprazole, frovatriptan, metoCLOPramide, atorvastatin, terbinafine, Vitamin D (Ergocalciferol), and ALPRAZolam. We will continue to administer cyanocobalamin.  Meds ordered this encounter  Medications  . ciprofloxacin (CIPRO) 500 MG tablet    Sig: Take 1 tablet (500 mg total) by mouth 2 (two) times daily.    Dispense:  14 tablet    Refill:  0  . metroNIDAZOLE (FLAGYL) 500 MG tablet    Sig: Take 1 tablet (500 mg total) by mouth 3 (three) times daily. For infection. Take the entire course    Dispense:  30 tablet    Refill:  0  . nabumetone (RELAFEN) 500 MG tablet    Sig: Take 2 tablets (1,000 mg total) by mouth 2 (two) times daily. For muscle and joint pain    Dispense:  120 tablet    Refill:  2     Follow-up: No Follow-up on file.  Claretta Fraise, M.D.

## 2014-10-29 NOTE — Patient Instructions (Signed)
Out of work for 2 weeks

## 2014-10-30 LAB — CBC WITH DIFFERENTIAL/PLATELET
Basophils Absolute: 0.1 10*3/uL (ref 0.0–0.2)
Basos: 1 %
EOS (ABSOLUTE): 0.4 10*3/uL (ref 0.0–0.4)
EOS: 5 %
HEMATOCRIT: 37.6 % (ref 34.0–46.6)
HEMOGLOBIN: 12.4 g/dL (ref 11.1–15.9)
IMMATURE GRANS (ABS): 0 10*3/uL (ref 0.0–0.1)
Immature Granulocytes: 0 %
LYMPHS ABS: 2.2 10*3/uL (ref 0.7–3.1)
Lymphs: 28 %
MCH: 29.4 pg (ref 26.6–33.0)
MCHC: 33 g/dL (ref 31.5–35.7)
MCV: 89 fL (ref 79–97)
MONOCYTES: 8 %
Monocytes Absolute: 0.7 10*3/uL (ref 0.1–0.9)
NEUTROS ABS: 4.6 10*3/uL (ref 1.4–7.0)
Neutrophils: 58 %
Platelets: 339 10*3/uL (ref 150–379)
RBC: 4.22 x10E6/uL (ref 3.77–5.28)
RDW: 13.7 % (ref 12.3–15.4)
WBC: 7.9 10*3/uL (ref 3.4–10.8)

## 2014-10-30 LAB — CMP14+EGFR
ALBUMIN: 3.7 g/dL (ref 3.5–5.5)
ALK PHOS: 101 IU/L (ref 39–117)
ALT: 7 IU/L (ref 0–32)
AST: 7 IU/L (ref 0–40)
Albumin/Globulin Ratio: 1.9 (ref 1.1–2.5)
BILIRUBIN TOTAL: 0.4 mg/dL (ref 0.0–1.2)
BUN / CREAT RATIO: 10 (ref 9–23)
BUN: 7 mg/dL (ref 6–24)
CO2: 24 mmol/L (ref 18–29)
CREATININE: 0.71 mg/dL (ref 0.57–1.00)
Calcium: 9 mg/dL (ref 8.7–10.2)
Chloride: 105 mmol/L (ref 97–108)
GFR calc non Af Amer: 105 mL/min/{1.73_m2} (ref >59)
GFR, EST AFRICAN AMERICAN: 121 mL/min/{1.73_m2} (ref >59)
GLOBULIN, TOTAL: 2 g/dL (ref 1.5–4.5)
Glucose: 86 mg/dL (ref 65–99)
Potassium: 3.8 mmol/L (ref 3.5–5.2)
SODIUM: 144 mmol/L (ref 134–144)
TOTAL PROTEIN: 5.7 g/dL — AB (ref 6.0–8.5)

## 2014-10-30 LAB — AMYLASE: AMYLASE: 39 U/L (ref 31–124)

## 2014-10-30 LAB — LIPASE: Lipase: 19 U/L (ref 0–59)

## 2014-10-31 ENCOUNTER — Encounter: Payer: Self-pay | Admitting: Internal Medicine

## 2014-11-03 ENCOUNTER — Telehealth: Payer: Self-pay | Admitting: Family Medicine

## 2014-11-03 DIAGNOSIS — Z029 Encounter for administrative examinations, unspecified: Secondary | ICD-10-CM

## 2014-11-03 NOTE — Telephone Encounter (Signed)
See if she is willing to go to [place]. [employer], used to see my patients very quickly. Thanks, WS

## 2014-11-04 NOTE — Telephone Encounter (Signed)
Will look into this and address with patient

## 2014-11-07 ENCOUNTER — Other Ambulatory Visit: Payer: Self-pay | Admitting: Family

## 2014-11-07 MED ORDER — ALPRAZOLAM 0.5 MG PO TABS: ORAL_TABLET | ORAL | Status: DC

## 2014-11-07 NOTE — Telephone Encounter (Signed)
rx called into pharmacy

## 2014-11-10 ENCOUNTER — Encounter: Payer: Self-pay | Admitting: Family Medicine

## 2014-11-12 ENCOUNTER — Ambulatory Visit (INDEPENDENT_AMBULATORY_CARE_PROVIDER_SITE_OTHER): Payer: 59 | Admitting: Family Medicine

## 2014-11-12 ENCOUNTER — Encounter: Payer: Self-pay | Admitting: Family Medicine

## 2014-11-12 VITALS — BP 105/75 | HR 74 | Temp 97.9°F | Ht 66.0 in | Wt 187.8 lb

## 2014-11-12 DIAGNOSIS — R52 Pain, unspecified: Secondary | ICD-10-CM

## 2014-11-12 DIAGNOSIS — R1084 Generalized abdominal pain: Secondary | ICD-10-CM

## 2014-11-12 MED ORDER — LUBIPROSTONE 24 MCG PO CAPS: 24.0000 ug | ORAL_CAPSULE | Freq: Two times a day (BID) | ORAL | Status: DC

## 2014-11-12 NOTE — Progress Notes (Signed)
Subjective:  Patient ID: Kathy Ewing, female    DOB: [birth year]  Age: 44 y.o. MRN: [id number]  CC: Abdominal Pain   HPI HILDAGARD SOBECKI presents for Some loose BM. Lower abd pain, constant. Occ 8/10, occurs daily for 1-2 hours. It is asting less time, but still intense with BLQ Crampy ache. Awaiting referral. Thought it would be ready by now.   History Kami has a past medical history of B12 deficiency; Vitamin D deficiency; Anxiety; Numbness; Headache; Pneumonia; GERD (gastroesophageal reflux disease); and High cholesterol.   She has past surgical history that includes Cesarean section and Cholecystectomy (N/A, 10/02/2014).   Her family history includes Anxiety disorder in her brother; Cerebral palsy in her daughter; Depression in her daughter; Hypertension in her mother; Migraines in her sister; Multiple sclerosis in her sister.She reports that she has been smoking Cigarettes.  She has a 15 pack-year smoking history. She has never used smokeless tobacco. She reports that she does not drink alcohol or use illicit drugs.  Outpatient Prescriptions Prior to Visit  Medication Sig Dispense Refill  . ALPRAZolam (XANAX) 0.5 MG tablet TAKE 1 TO 2 TABLETS BY MOUTH TWICE DAILYAS NEEDED FOR ANXIETY 60 tablet 0  . frovatriptan (FROVA) 2.5 MG tablet Take 1 tablet (2.5 mg total) by mouth as needed for migraine. If recurs, may repeat after 2 hours. Max of 3 tabs in 24 hours. 10 tablet 0  . metoCLOPramide (REGLAN) 5 MG tablet Take one as needed at the onset of HA (Patient taking differently: Take 5 mg by mouth as needed (headache). Take one as needed at the onset of HA) 30 tablet 5  . omeprazole (PRILOSEC) 20 MG capsule Take 1 capsule (20 mg total) by mouth daily. 30 capsule 11  . Vitamin D, Ergocalciferol, (DRISDOL) 50000 UNITS CAPS capsule Take 50,000 Units by mouth every 7 (seven) days.    [name] Kitchen atorvastatin (LIPITOR) 40 MG tablet Take 1 tablet (40 mg total) by mouth daily. (Patient taking differently:  Take 40 mg by mouth every evening. ) 90 tablet 1  . nabumetone (RELAFEN) 500 MG tablet Take 2 tablets (1,000 mg total) by mouth 2 (two) times daily. For muscle and joint pain 120 tablet 2  . terbinafine (LAMISIL) 250 MG tablet Take 1 tablet (250 mg total) by mouth daily. 90 tablet 0  . ciprofloxacin (CIPRO) 500 MG tablet Take 1 tablet (500 mg total) by mouth 2 (two) times daily. (Patient not taking: Reported on 11/12/2014) 14 tablet 0  . metroNIDAZOLE (FLAGYL) 500 MG tablet Take 1 tablet (500 mg total) by mouth 3 (three) times daily. For infection. Take the entire course (Patient not taking: Reported on 11/12/2014) 30 tablet 0   Facility-Administered Medications Prior to Visit  Medication Dose Route Frequency Provider Last Rate Last Dose  . cyanocobalamin ((VITAMIN B-12)) injection 1,000 mcg  1,000 mcg Intramuscular Q30 days Claretta Fraise, MD   1,000 mcg at 09/15/14 1113    ROS Review of Systems  Constitutional: Positive for activity change (decreased). Negative for chills, diaphoresis and fatigue.  HENT: Negative for congestion and rhinorrhea.   Respiratory: Negative for cough and shortness of breath.   Cardiovascular: Negative for chest pain and palpitations.  Gastrointestinal: Positive for abdominal pain, constipation and abdominal distention. Negative for nausea, vomiting, blood in stool and rectal pain.  Genitourinary: Negative for dysuria and frequency.  Musculoskeletal: Negative for myalgias and arthralgias.  Skin: Negative for rash and wound.  All other systems reviewed and are negative.   Objective:  BP 105/75 mmHg  Pulse 74  Temp(Src) 97.9 F (36.6 C) (Oral)  Ht 5\' 6"  (1.676 m)  Wt 187 lb 12.8 oz (85.186 kg)  BMI 30.33 kg/m2  LMP 10/19/2014  BP Readings from Last 3 Encounters:  11/12/14 105/75  10/29/14 120/70  10/02/14 121/62    Wt Readings from Last 3 Encounters:  11/12/14 187 lb 12.8 oz (85.186 kg)  10/29/14 186 lb 6.4 oz (84.55 kg)  10/02/14 185 lb (83.915 kg)      Physical Exam  Constitutional: She is oriented to person, place, and time. She appears well-developed and well-nourished.  HENT:  Head: Normocephalic and atraumatic.  Cardiovascular: Normal rate and regular rhythm.   No murmur heard. Pulmonary/Chest: Effort normal and breath sounds normal.  Abdominal: Soft. Bowel sounds are normal. She exhibits distension. She exhibits no mass. There is tenderness. There is no rebound and no guarding.  Neurological: She is alert and oriented to person, place, and time.  Skin: Skin is warm and dry.  Psychiatric: She has a normal mood and affect. Her behavior is normal.    No results found for: HGBA1C  Lab Results  Component Value Date   WBC 7.9 10/29/2014   HGB 13.3 10/02/2014   HCT 37.6 10/29/2014   PLT 297 10/02/2014   GLUCOSE 86 10/29/2014   CHOL 100 09/11/2014   TRIG 62 09/11/2014   HDL 41 09/11/2014   LDLCALC 47 09/11/2014   ALT 7 10/29/2014   AST 7 10/29/2014   NA 144 10/29/2014   K 3.8 10/29/2014   CL 105 10/29/2014   CREATININE 0.71 10/29/2014   BUN 7 10/29/2014   CO2 24 10/29/2014   TSH 3.180 02/19/2014    Ct Abdomen Pelvis W Contrast  10/28/2014  CLINICAL DATA:  Lower abdominal pain, nausea. Prior cholecystectomy 10/02/2014. EXAM: CT ABDOMEN AND PELVIS WITH CONTRAST TECHNIQUE: Multidetector CT imaging of the abdomen and pelvis was performed using the standard protocol following bolus administration of intravenous contrast. CONTRAST:  1 OMNIPAQUE IOHEXOL 300 MG/ML SOLN, 133mL ISOVUE-300 IOPAMIDOL (ISOVUE-300) INJECTION 61% COMPARISON:  Ultrasound 09/15/2014 FINDINGS: Lung bases are clear.  No effusions.  Heart is normal size. Small cysts scattered throughout the liver which appear benign. Prior cholecystectomy. Spleen, pancreas, adrenals and kidneys are normal. Bowel grossly unremarkable. No free fluid, free air, or adenopathy. Appendix is visualized and is normal. Uterus, adnexae and urinary bladder normal. No acute bony  abnormality or focal bone lesion. IMPRESSION: No acute findings in the abdomen or pelvis. No complicating feature from recent cholecystectomy. Scattered small benign-appearing hepatic cysts. Electronically Signed   By: Rolm Baptise M.D.   On: 10/28/2014 10:37    Assessment & Plan:   Masey was seen today for abdominal pain.  Diagnoses and all orders for this visit:  Abdominal pain, generalized  Body aches  Other orders -     lubiprostone (AMITIZA) 24 MCG capsule; Take 1 capsule (24 mcg total) by mouth 2 (two) times daily with a meal. For abdominal pain  I have discontinued Ms. Mohamud's atorvastatin, terbinafine, ciprofloxacin, metroNIDAZOLE, and nabumetone. I am also having her start on lubiprostone. Additionally, I am having her maintain her omeprazole, frovatriptan, metoCLOPramide, Vitamin D (Ergocalciferol), and ALPRAZolam. We will continue to administer cyanocobalamin.  Meds ordered this encounter  Medications  . lubiprostone (AMITIZA) 24 MCG capsule    Sig: Take 1 capsule (24 mcg total) by mouth 2 (two) times daily with a meal. For abdominal pain    Dispense:  60 capsule  Refill:  2   Check on referral delay  Follow-up: Return in about 2 weeks (around 11/26/2014).  Claretta Fraise, M.D.

## 2014-11-25 ENCOUNTER — Ambulatory Visit (INDEPENDENT_AMBULATORY_CARE_PROVIDER_SITE_OTHER): Payer: 59 | Admitting: Family Medicine

## 2014-11-25 ENCOUNTER — Encounter: Payer: Self-pay | Admitting: Family Medicine

## 2014-11-25 VITALS — BP 108/79 | HR 78 | Temp 97.7°F | Ht 66.0 in | Wt 191.4 lb

## 2014-11-25 DIAGNOSIS — R1084 Generalized abdominal pain: Secondary | ICD-10-CM | POA: Diagnosis not present

## 2014-11-25 DIAGNOSIS — K219 Gastro-esophageal reflux disease without esophagitis: Secondary | ICD-10-CM | POA: Diagnosis not present

## 2014-11-25 DIAGNOSIS — F411 Generalized anxiety disorder: Secondary | ICD-10-CM

## 2014-11-25 DIAGNOSIS — G43011 Migraine without aura, intractable, with status migrainosus: Secondary | ICD-10-CM

## 2014-11-25 NOTE — Progress Notes (Signed)
Subjective:  Patient ID: Kathy Ewing, female    DOB: [birth year]  Age: 44 y.o. MRN: [id number]  CC: Abdominal Pain   HPI Kathy Ewing presents for 7-8 pain. Occurring daily. Lasts up to an hour. Getting scoped in three weeks. Upper and lower endo. Needs to back to work. Asked me to complete a return to work fitness for duty form. Since the pain can at times be present for just a few minutes a day and much less severe she would like to return to work as she is running out of FMLA times. Medications listed below do seem to be helping her level out. However, she is concerned about fluid gain because of feeling bloated in her abdomen and having a significant weight gain recently. Additionally her headaches are continuing unabated. She does not get relief from the Frova she is no longer taking anything else as a prophylactic agent. She says nothing the neurologist gave her as helped and she is not able to go back before January due to timing and work issues.  History Kathy Ewing has a past medical history of B12 deficiency; Vitamin D deficiency; Anxiety; Numbness; Headache; Pneumonia; GERD (gastroesophageal reflux disease); and High cholesterol.   She has past surgical history that includes Cesarean section and Cholecystectomy (N/A, 10/02/2014).   Her family history includes Anxiety disorder in her brother; Cerebral palsy in her daughter; Depression in her daughter; Hypertension in her mother; Migraines in her sister; Multiple sclerosis in her sister.She reports that she has been smoking Cigarettes.  She has a 15 pack-year smoking history. She has never used smokeless tobacco. She reports that she does not drink alcohol or use illicit drugs.  Outpatient Prescriptions Prior to Visit  Medication Sig Dispense Refill  . ALPRAZolam (XANAX) 0.5 MG tablet TAKE 1 TO 2 TABLETS BY MOUTH TWICE DAILYAS NEEDED FOR ANXIETY 60 tablet 0  . frovatriptan (FROVA) 2.5 MG tablet Take 1 tablet (2.5 mg total) by mouth as  needed for migraine. If recurs, may repeat after 2 hours. Max of 3 tabs in 24 hours. 10 tablet 0  . lubiprostone (AMITIZA) 24 MCG capsule Take 1 capsule (24 mcg total) by mouth 2 (two) times daily with a meal. For abdominal pain 60 capsule 2  . metoCLOPramide (REGLAN) 5 MG tablet Take one as needed at the onset of HA (Patient taking differently: Take 5 mg by mouth as needed (headache). Take one as needed at the onset of HA) 30 tablet 5  . omeprazole (PRILOSEC) 20 MG capsule Take 1 capsule (20 mg total) by mouth daily. 30 capsule 11  . Vitamin D, Ergocalciferol, (DRISDOL) 50000 UNITS CAPS capsule Take 50,000 Units by mouth every 7 (seven) days.     Facility-Administered Medications Prior to Visit  Medication Dose Route Frequency Provider Last Rate Last Dose  . cyanocobalamin ((VITAMIN B-12)) injection 1,000 mcg  1,000 mcg Intramuscular Q30 days Claretta Fraise, MD   1,000 mcg at 09/15/14 1113    ROS Review of Systems  Constitutional: Negative for fever, chills, diaphoresis, appetite change, fatigue and unexpected weight change.  HENT: Negative for congestion, ear pain, hearing loss, postnasal drip, rhinorrhea, sneezing, sore throat and trouble swallowing.   Eyes: Negative for pain.  Respiratory: Negative for cough, chest tightness and shortness of breath.   Cardiovascular: Negative for chest pain and palpitations.  Gastrointestinal: Negative for nausea, vomiting, abdominal pain, diarrhea and constipation.  Genitourinary: Negative for dysuria, frequency and menstrual problem.  Musculoskeletal: Negative for joint swelling and arthralgias.  Skin: Negative for rash.  Neurological: Negative for dizziness, weakness, numbness and headaches.  Psychiatric/Behavioral: Negative for dysphoric mood and agitation.    Objective:  BP 108/79 mmHg  Pulse 78  Temp(Src) 97.7 F (36.5 C) (Oral)  Ht 5' 6" (1.676 m)  Wt 191 lb 6.4 oz (86.818 kg)  BMI 30.91 kg/m2  SpO2 100%  LMP 11/23/2014  BP Readings  from Last 3 Encounters:  11/25/14 108/79  11/12/14 105/75  10/29/14 120/70    Wt Readings from Last 3 Encounters:  11/25/14 191 lb 6.4 oz (86.818 kg)  11/12/14 187 lb 12.8 oz (85.186 kg)  10/29/14 186 lb 6.4 oz (84.55 kg)     Physical Exam  Constitutional: She is oriented to person, place, and time. She appears well-developed and well-nourished. No distress.  HENT:  Head: Normocephalic and atraumatic.  Right Ear: External ear normal.  Left Ear: External ear normal.  Nose: Nose normal.  Mouth/Throat: Oropharynx is clear and moist.  Eyes: Conjunctivae and EOM are normal. Pupils are equal, round, and reactive to light.  Neck: Normal range of motion. Neck supple. No thyromegaly present.  Cardiovascular: Normal rate, regular rhythm and normal heart sounds.   No murmur heard. Pulmonary/Chest: Effort normal and breath sounds normal. No respiratory distress. She has no wheezes. She has no rales.  Abdominal: Soft. Bowel sounds are normal. She exhibits no distension. There is no tenderness.  Lymphadenopathy:    She has no cervical adenopathy.  Neurological: She is alert and oriented to person, place, and time. She has normal reflexes.  Skin: Skin is warm and dry.  Psychiatric: She has a normal mood and affect. Her behavior is normal. Judgment and thought content normal.    No results found for: HGBA1C  Lab Results  Component Value Date   WBC 11.6* 11/25/2014   HGB 13.3 10/02/2014   HCT 41.3 11/25/2014   PLT 297 10/02/2014   GLUCOSE 82 11/25/2014   CHOL 100 09/11/2014   TRIG 62 09/11/2014   HDL 41 09/11/2014   LDLCALC 47 09/11/2014   ALT 18 11/25/2014   AST 16 11/25/2014   NA 141 11/25/2014   K 4.2 11/25/2014   CL 102 11/25/2014   CREATININE 0.67 11/25/2014   BUN 8 11/25/2014   CO2 24 11/25/2014   TSH 3.180 02/19/2014    Ct Abdomen Pelvis W Contrast  10/28/2014  CLINICAL DATA:  Lower abdominal pain, nausea. Prior cholecystectomy 10/02/2014. EXAM: CT ABDOMEN AND PELVIS  WITH CONTRAST TECHNIQUE: Multidetector CT imaging of the abdomen and pelvis was performed using the standard protocol following bolus administration of intravenous contrast. CONTRAST:  1 OMNIPAQUE IOHEXOL 300 MG/ML SOLN, 173m ISOVUE-300 IOPAMIDOL (ISOVUE-300) INJECTION 61% COMPARISON:  Ultrasound 09/15/2014 FINDINGS: Lung bases are clear.  No effusions.  Heart is normal size. Small cysts scattered throughout the liver which appear benign. Prior cholecystectomy. Spleen, pancreas, adrenals and kidneys are normal. Bowel grossly unremarkable. No free fluid, free air, or adenopathy. Appendix is visualized and is normal. Uterus, adnexae and urinary bladder normal. No acute bony abnormality or focal bone lesion. IMPRESSION: No acute findings in the abdomen or pelvis. No complicating feature from recent cholecystectomy. Scattered small benign-appearing hepatic cysts. Electronically Signed   By: KRolm BaptiseM.D.   On: 10/28/2014 10:37    Assessment & Plan:   MAmberlinwas seen today for abdominal pain.  Diagnoses and all orders for this visit:  Abdominal pain, generalized -     CBC with Differential/Platelet -     CMP14+EGFR  Gastroesophageal reflux disease without esophagitis -     CBC with Differential/Platelet -     CMP14+EGFR  Intractable migraine without aura and with status migrainosus -     CBC with Differential/Platelet -     CMP14+EGFR  GAD (generalized anxiety disorder) -     CBC with Differential/Platelet -     CMP14+EGFR   I am having Ms. Berline Lopes maintain her omeprazole, frovatriptan, metoCLOPramide, Vitamin D (Ergocalciferol), ALPRAZolam, lubiprostone, and nabumetone. We will continue to administer cyanocobalamin.  Meds ordered this encounter  Medications  . nabumetone (RELAFEN) 500 MG tablet    Sig: Take 1 tablet by mouth 2 (two) times daily.    Refill:  0     Follow-up: Return in about 1 month (around 12/25/2014) for Abdominal pain, headache.  Claretta Fraise, M.D.

## 2014-11-26 LAB — CMP14+EGFR
ALK PHOS: 100 IU/L (ref 39–117)
ALT: 18 IU/L (ref 0–32)
AST: 16 IU/L (ref 0–40)
Albumin/Globulin Ratio: 1.6 (ref 1.1–2.5)
Albumin: 3.9 g/dL (ref 3.5–5.5)
BUN/Creatinine Ratio: 12 (ref 9–23)
BUN: 8 mg/dL (ref 6–24)
Bilirubin Total: 0.5 mg/dL (ref 0.0–1.2)
CO2: 24 mmol/L (ref 18–29)
CREATININE: 0.67 mg/dL (ref 0.57–1.00)
Calcium: 8.6 mg/dL — ABNORMAL LOW (ref 8.7–10.2)
Chloride: 102 mmol/L (ref 97–106)
GFR calc Af Amer: 125 mL/min/{1.73_m2} (ref >59)
GFR calc non Af Amer: 109 mL/min/{1.73_m2} (ref >59)
GLUCOSE: 82 mg/dL (ref 65–99)
Globulin, Total: 2.5 g/dL (ref 1.5–4.5)
Potassium: 4.2 mmol/L (ref 3.5–5.2)
SODIUM: 141 mmol/L (ref 136–144)
Total Protein: 6.4 g/dL (ref 6.0–8.5)

## 2014-11-26 LAB — CBC WITH DIFFERENTIAL/PLATELET
BASOS: 1 %
Basophils Absolute: 0.1 10*3/uL (ref 0.0–0.2)
EOS (ABSOLUTE): 0.5 10*3/uL — AB (ref 0.0–0.4)
EOS: 4 %
Hematocrit: 41.3 % (ref 34.0–46.6)
Hemoglobin: 13.6 g/dL (ref 11.1–15.9)
Immature Grans (Abs): 0.1 10*3/uL (ref 0.0–0.1)
Immature Granulocytes: 1 %
LYMPHS ABS: 1.8 10*3/uL (ref 0.7–3.1)
Lymphs: 16 %
MCH: 29.5 pg (ref 26.6–33.0)
MCHC: 32.9 g/dL (ref 31.5–35.7)
MCV: 90 fL (ref 79–97)
MONOS ABS: 1.1 10*3/uL — AB (ref 0.1–0.9)
Monocytes: 9 %
NEUTROS ABS: 8.1 10*3/uL — AB (ref 1.4–7.0)
Neutrophils: 69 %
Platelets: 302 10*3/uL (ref 150–379)
RBC: 4.61 x10E6/uL (ref 3.77–5.28)
RDW: 13.8 % (ref 12.3–15.4)
WBC: 11.6 10*3/uL — ABNORMAL HIGH (ref 3.4–10.8)

## 2014-11-27 ENCOUNTER — Encounter: Payer: Self-pay | Admitting: *Deleted

## 2014-12-16 ENCOUNTER — Other Ambulatory Visit: Payer: Self-pay | Admitting: Family Medicine

## 2014-12-17 MED ORDER — ALPRAZOLAM 0.5 MG PO TABS: ORAL_TABLET | ORAL | Status: DC

## 2014-12-17 NOTE — Telephone Encounter (Signed)
rx called into pharmacy

## 2014-12-29 ENCOUNTER — Ambulatory Visit: Payer: 59 | Admitting: Internal Medicine

## 2015-01-13 ENCOUNTER — Encounter: Payer: 59 | Admitting: Family Medicine

## 2015-01-14 ENCOUNTER — Ambulatory Visit (INDEPENDENT_AMBULATORY_CARE_PROVIDER_SITE_OTHER): Payer: 59 | Admitting: Family Medicine

## 2015-01-14 ENCOUNTER — Encounter: Payer: Self-pay | Admitting: Family Medicine

## 2015-01-14 VITALS — BP 98/70 | HR 84 | Temp 97.3°F | Ht 66.0 in | Wt 196.8 lb

## 2015-01-14 DIAGNOSIS — R1084 Generalized abdominal pain: Secondary | ICD-10-CM | POA: Diagnosis not present

## 2015-01-14 DIAGNOSIS — E538 Deficiency of other specified B group vitamins: Secondary | ICD-10-CM | POA: Diagnosis not present

## 2015-01-14 DIAGNOSIS — N760 Acute vaginitis: Secondary | ICD-10-CM | POA: Diagnosis not present

## 2015-01-14 DIAGNOSIS — A499 Bacterial infection, unspecified: Secondary | ICD-10-CM

## 2015-01-14 DIAGNOSIS — B9689 Other specified bacterial agents as the cause of diseases classified elsewhere: Secondary | ICD-10-CM

## 2015-01-14 DIAGNOSIS — F411 Generalized anxiety disorder: Secondary | ICD-10-CM

## 2015-01-14 DIAGNOSIS — K2100 Gastro-esophageal reflux disease with esophagitis, without bleeding: Secondary | ICD-10-CM

## 2015-01-14 DIAGNOSIS — N898 Other specified noninflammatory disorders of vagina: Secondary | ICD-10-CM | POA: Diagnosis not present

## 2015-01-14 LAB — POCT WET PREP WITH KOH
TRICHOMONAS UA: NEGATIVE
WBC Wet Prep HPF POC: NEGATIVE
YEAST WET PREP PER HPF POC: NEGATIVE

## 2015-01-14 MED ORDER — OMEPRAZOLE 40 MG PO CPDR: 40.0000 mg | DELAYED_RELEASE_CAPSULE | Freq: Every day | ORAL | Status: DC

## 2015-01-14 MED ORDER — ALPRAZOLAM 0.5 MG PO TABS: ORAL_TABLET | ORAL | Status: DC

## 2015-01-14 MED ORDER — ATORVASTATIN CALCIUM 40 MG PO TABS: 40.0000 mg | ORAL_TABLET | Freq: Every day | ORAL | Status: DC

## 2015-01-14 NOTE — Progress Notes (Signed)
Subjective:  Patient ID: Kathy Ewing, female    DOB: [birth year]  Age: 45 y.o. MRN: [id number]  CC: GAD and Abdominal Pain   HPI Kathy Ewing presents for vaginal discharge for 1 week before menses. Continues through menses which is near end now. Has had before. This time off and on for 5 months. Lots of stress leading to abd. Pain is diffuse. Described as sharp twisting pain from 2-3/10 up to  5/10. Not nearly as bad as previous to endoscopy.  Still has frequent bouts of anxiety with sudden onset of chest pain and feeling like she can't breathe. Quick relief with alprazolam.    GAD 7 : Generalized Anxiety Score 01/14/2015  Nervous, Anxious, on Edge 3  Control/stop worrying 2  Worry too much - different things 2  Trouble relaxing 1  Restless 0  Easily annoyed or irritable 0  Afraid - awful might happen 0  Total GAD 7 Score 8  Anxiety Difficulty Not difficult at all       History Kathy Ewing has a past medical history of B12 deficiency; Vitamin D deficiency; Anxiety; Numbness; Headache; Pneumonia; GERD (gastroesophageal reflux disease); High cholesterol; and Cholelithiasis.   She has past surgical history that includes Cesarean section and Cholecystectomy (N/A, 10/02/2014).   Her family history includes Anxiety disorder in her brother; Cerebral palsy in her daughter; Depression in her daughter; Hypertension in her mother; Migraines in her sister; Multiple sclerosis in her sister.She reports that she has been smoking Cigarettes.  She has a 15 pack-year smoking history. She has never used smokeless tobacco. She reports that she does not drink alcohol or use illicit drugs.  Outpatient Prescriptions Prior to Visit  Medication Sig Dispense Refill  . metoCLOPramide (REGLAN) 5 MG tablet Take one as needed at the onset of HA (Patient taking differently: Take 5 mg by mouth as needed (headache). Take one as needed at the onset of HA) 30 tablet 5  . Vitamin D, Ergocalciferol, (DRISDOL)  50000 UNITS CAPS capsule Take 50,000 Units by mouth every 7 (seven) days.    . ALPRAZolam (XANAX) 0.5 MG tablet TAKE 1 TO 2 TABLETS BY MOUTH TWICE DAILYAS NEEDED FOR ANXIETY 60 tablet 0  . omeprazole (PRILOSEC) 20 MG capsule Take 1 capsule (20 mg total) by mouth daily. 30 capsule 11  . frovatriptan (FROVA) 2.5 MG tablet Take 1 tablet (2.5 mg total) by mouth as needed for migraine. If recurs, may repeat after 2 hours. Max of 3 tabs in 24 hours. (Patient not taking: Reported on 01/14/2015) 10 tablet 0  . lubiprostone (AMITIZA) 24 MCG capsule Take 1 capsule (24 mcg total) by mouth 2 (two) times daily with a meal. For abdominal pain (Patient not taking: Reported on 01/14/2015) 60 capsule 2  . nabumetone (RELAFEN) 500 MG tablet Take 1 tablet by mouth 2 (two) times daily. Reported on 01/14/2015  0   Facility-Administered Medications Prior to Visit  Medication Dose Route Frequency Provider Last Rate Last Dose  . cyanocobalamin ((VITAMIN B-12)) injection 1,000 mcg  1,000 mcg Intramuscular Q30 days Claretta Fraise, MD   1,000 mcg at 01/14/15 1013    ROS Review of Systems  Constitutional: Negative for fever, activity change and appetite change.  HENT: Negative for congestion, rhinorrhea and sore throat.   Eyes: Negative for visual disturbance.  Respiratory: Negative for cough and shortness of breath.   Cardiovascular: Negative for chest pain and palpitations.  Gastrointestinal: Positive for abdominal pain and abdominal distention. Negative for nausea, vomiting, diarrhea,  constipation, blood in stool and rectal pain.  Genitourinary: Negative for dysuria.  Musculoskeletal: Negative for myalgias and arthralgias.  Neurological: Positive for headaches (unchanged). Negative for dizziness.  Psychiatric/Behavioral: Positive for dysphoric mood. The patient is nervous/anxious.     Objective:  BP 98/70 mmHg  Pulse 84  Temp(Src) 97.3 F (36.3 C) (Oral)  Ht 5\' 6"  (1.676 m)  Wt 196 lb 12.8 oz (89.268 kg)  BMI  31.78 kg/m2  SpO2 98%  LMP 01/11/2015  BP Readings from Last 3 Encounters:  01/14/15 98/70  11/25/14 108/79  11/12/14 105/75    Wt Readings from Last 3 Encounters:  01/14/15 196 lb 12.8 oz (89.268 kg)  11/25/14 191 lb 6.4 oz (86.818 kg)  11/12/14 187 lb 12.8 oz (85.186 kg)     Physical Exam  Constitutional: She is oriented to person, place, and time. She appears well-developed and well-nourished. No distress.  HENT:  Head: Normocephalic and atraumatic.  Right Ear: External ear normal.  Left Ear: External ear normal.  Nose: Nose normal.  Mouth/Throat: Oropharynx is clear and moist.  Eyes: Conjunctivae and EOM are normal. Pupils are equal, round, and reactive to light.  Neck: Normal range of motion. Neck supple. No thyromegaly present.  Cardiovascular: Normal rate, regular rhythm and normal heart sounds.   No murmur heard. Pulmonary/Chest: Effort normal and breath sounds normal. No respiratory distress. She has no wheezes. She has no rales.  Abdominal: Soft. Bowel sounds are normal. She exhibits no distension and no mass. There is no tenderness. There is no rebound and no guarding.  Lymphadenopathy:    She has no cervical adenopathy.  Neurological: She is alert and oriented to person, place, and time. She has normal reflexes.  Skin: Skin is warm and dry.  Psychiatric: She has a normal mood and affect. Her behavior is normal. Judgment and thought content normal.     Lab Results  Component Value Date   WBC 11.6* 11/25/2014   HGB 13.3 10/02/2014   HCT 41.3 11/25/2014   PLT 297 10/02/2014   GLUCOSE 82 11/25/2014   CHOL 100 09/11/2014   TRIG 62 09/11/2014   HDL 41 09/11/2014   LDLCALC 47 09/11/2014   ALT 18 11/25/2014   AST 16 11/25/2014   NA 141 11/25/2014   K 4.2 11/25/2014   CL 102 11/25/2014   CREATININE 0.67 11/25/2014   BUN 8 11/25/2014   CO2 24 11/25/2014   TSH 3.180 02/19/2014    Ct Abdomen Pelvis W Contrast  10/28/2014  CLINICAL DATA:  Lower abdominal  pain, nausea. Prior cholecystectomy 10/02/2014. EXAM: CT ABDOMEN AND PELVIS WITH CONTRAST TECHNIQUE: Multidetector CT imaging of the abdomen and pelvis was performed using the standard protocol following bolus administration of intravenous contrast. CONTRAST:  1 OMNIPAQUE IOHEXOL 300 MG/ML SOLN, 117mL ISOVUE-300 IOPAMIDOL (ISOVUE-300) INJECTION 61% COMPARISON:  Ultrasound 09/15/2014 FINDINGS: Lung bases are clear.  No effusions.  Heart is normal size. Small cysts scattered throughout the liver which appear benign. Prior cholecystectomy. Spleen, pancreas, adrenals and kidneys are normal. Bowel grossly unremarkable. No free fluid, free air, or adenopathy. Appendix is visualized and is normal. Uterus, adnexae and urinary bladder normal. No acute bony abnormality or focal bone lesion. IMPRESSION: No acute findings in the abdomen or pelvis. No complicating feature from recent cholecystectomy. Scattered small benign-appearing hepatic cysts. Electronically Signed   By: Rolm Baptise M.D.   On: 10/28/2014 10:37    Assessment & Plan:   Kathy Ewing was seen today for gad and abdominal pain.  Diagnoses and all orders for this visit:  Gastroesophageal reflux disease with esophagitis  GAD (generalized anxiety disorder)  Abdominal pain, generalized -     POCT Wet Prep with KOH  Bacterial vaginosis -     POCT Wet Prep with KOH  Vaginal discharge -     POCT Wet Prep with KOH  Other orders -     omeprazole (PRILOSEC) 40 MG capsule; Take 1 capsule (40 mg total) by mouth daily. On an empty stomach -     ALPRAZolam (XANAX) 0.5 MG tablet; TAKE 1 TO 2 TABLETS BY MOUTH TWICE DAILYAS NEEDED FOR ANXIETY -     atorvastatin (LIPITOR) 40 MG tablet; Take 1 tablet (40 mg total) by mouth daily.   I have discontinued Kathy Ewing's cyanocobalamin. I have also changed her omeprazole and atorvastatin. Additionally, I am having her maintain her frovatriptan, metoCLOPramide, Vitamin D (Ergocalciferol), lubiprostone, nabumetone, and  ALPRAZolam. We administered cyanocobalamin. We will continue to administer cyanocobalamin.  Meds ordered this encounter  Medications  . DISCONTD: cyanocobalamin (,VITAMIN B-12,) 1000 MCG/ML injection    Sig: Inject as directed every 30 days.   [name] Kitchen DISCONTD: atorvastatin (LIPITOR) 40 MG tablet    Sig: Take 40 mg by mouth daily.  [name] Kitchen omeprazole (PRILOSEC) 40 MG capsule    Sig: Take 1 capsule (40 mg total) by mouth daily. On an empty stomach    Dispense:  60 capsule    Refill:  5  . ALPRAZolam (XANAX) 0.5 MG tablet    Sig: TAKE 1 TO 2 TABLETS BY MOUTH TWICE DAILYAS NEEDED FOR ANXIETY    Dispense:  60 tablet    Refill:  2  . atorvastatin (LIPITOR) 40 MG tablet    Sig: Take 1 tablet (40 mg total) by mouth daily.    Dispense:  30 tablet    Refill:  5     Follow-up: No Follow-up on file.  Claretta Fraise, M.D.

## 2015-01-14 NOTE — Patient Instructions (Signed)
Gluten-Free Diet for Celiac Disease Gluten is a protein found in wheat, rye, barley, and triticale (a cross between wheat and rye) grains. People with celiac disease need to have a gluten-free diet. With celiac disease, gluten interferes with the absorption of food and may also cause intestinal injury.  Strict compliance is important even during symptom-free periods. This means eliminating all foods with gluten from your diet permanently. This requires some significant changes but is very manageable. WHAT DO I NEED TO KNOW ABOUT A GLUTEN-FREE DIET?  Look for items labeled with "GF." Looking for GF will make it easier to identify products that are safe to eat.  Read all labels. Gluten may have been added as a minor ingredient where least expected, such as in shredded cheeses or ice creams. Always check food labels and investigate questionable ingredients. Talk to your dietitian or health care provider if you have questions about certain foods or need help finding GF foods.  Check when in doubt. If you are not sure whether an ingredient contains gluten, check with the manufacturer. Note that some manufacturers may change ingredients without notice. Always read labels.   Know how food is prepared. Since flour and cereal products are often used in the preparation of foods, it is important to be aware of the methods of preparation used, as well as the ingredients in the foods themselves. This is especially true when you are dining out. Ask restaurants if they have a gluten-free menu.  Watch for cross-contamination. Cross-contamination occurs when gluten-free foods come into contact with foods that contain gluten. It often happens during the manufacturing process. Always check the ingredient list and for warnings on packages, such as "may contain gluten."  Eat a balanced diet. It is important to still get enough fiber, iron, and B vitamins in your diet. Look for enriched whole grain gluten-free products  and continue to eat a well-balanced diet of the important non-grain items, such as vegetables, fruit, lean proteins, legumes, and dairy.  Consider taking a gluten-free multivitamin and mineral supplement. Discuss this with your health care provider. WHAT KEY WORDS HELP IDENTIFY GLUTEN? Know key words to help identify gluten. A dietitian can help you identify possible harmful ingredients in the foods you normally eat. Words to check for on food labels include:   Flour, enriched flour, bromated flour, white flour, durum flour, graham flour, phosphated flour, self-rising flour, semolina, or farina.  Starch, dextrin, modified food starch, or cereal.  Thickening, fillers, or emulsifiers.  Any kind of malt flavoring, extract, or syrup (malt is made from barley and includes malt vinegar, malted milk, and malted beverages).  Hydrolyzed vegetable protein. WHAT FOODS CAN I EAT? Below is a list of common foods that are allowed with a gluten-free diet.  Grains Products made from the following flours or grains:amaranth,bean flours, 100% buckwheat flour, corn, millet, nut flours or meals, GF oats, quinoa, rice, sorghum, teff, any all-purpose 100% GF flour mix, rice wafers, pure cornmeal tortillas, popcorn, some crackers, some chips, and hot cereals made from cornmeal. Ask your dietitian which specific hot and cold cereals are allowed. Hominy, rice or wild rice, and special GF pasta. Some Asian rice noodles or bean noodles. Arrowroot starch, corn bran, corn flour, corn germ, cornmeal, corn starch, potato flour, potato starch flour, and rice bran. Rice flours: plain, brown, and sweet. Rice polish, soy flour, tapioca starch. Vegetables All plain, fresh, frozen, or canned vegetables.  Fruits All fresh, frozen, canned, dried fruits, and fruit juices.  Meats and Other   Protein Foods Meat, fish, poultry, or eggs prepared without added wheat, rye, barley, or triticale. Some luncheon meat and some frankfurters.  Pure meat. All aged cheese, most processed cheese products, some cottage cheese, and some cream cheese. Dried beans, dried peas, and lentils.  Dairy Milk and yogurt made with allowed ingredients.  Beverages Coffee (regular or decaffeinated), tea, herbal tea (read label to be sure that no wheat flour has been added). Carbonated beverages and some root beers. Wine, sake, and distilled spirits, such as gin, vodka, and whiskey. GF beers and GF ciders.  Sweetsand Desserts Sugar, honey, some syrups, molasses, jelly, jam, plain hard candy, marshmallows, gumdrops, homemade candies free of wheat, rye, barley, or triticale. Coconut. Custard, some pudding mixes, and homemade puddings from cornstarch, rice, and tapioca. Gelatin desserts, sorbets, frozen ice pops, and sherbet. Cake, cookies, and other desserts prepared with allowed flours. Some commercial ice creams. Ask your dietitian about specific brands of dessert that are allowed.  Fats and Oils Butter, margarine, vegetable oil, sour cream not containing modified food starch, whipping cream, shortening, lard, cream, and some mayonnaise. Some commercial salad dressings. Peanut butter.  Other Homemade broth and soups made with allowed ingredients; some canned or frozen soups. Any other combination or prepared foods that do not contain gluten. Monosodium glutamate (MSG). Cider, rice, and wine vinegar. Baking soda and baking powder. Certain soy sauces (Tamari). Ask your dietitian about specific brands that are allowed. Nuts, coconut, chocolate, and pure cocoa powder. Salt, pepper, herbs, spices, extracts, and food colorings. The items listed above may not be a complete list of allowed foods or beverages. Contact your dietitian for more options.  WHAT FOODS CAN I NOT EAT? Below is a list of common foods that are not allowed with a gluten-free diet.  Grains Barley, bran, bulgur, cracked wheat, graham, malt, matzo, wheat germ, and all wheat and rye cereals  including spelt and kamut. Avoid cereals containing malt as a flavoring, such as rice cereal. Also avoid regular noodles, spaghetti, macaroni, and most packaged rice mixes, and all others containing wheat, rye, barley, or triticale.  Vegetables Most creamed vegetables, most vegetables canned in sauces, and any vegetables prepared with wheat, rye, barley, or triticale.  Fruits Thickened or prepared fruits and some pie fillings.  Meats and Other Protein Sources Any meat or meat alternative containing wheat, rye, barley, or gluten stabilizers (such as some hot dogs, salami, cold cuts, or sausage). Bread-containing products, such as Swiss steak, croquettes, and meatloaf. Most tuna canned in vegetable broth, turkey with hydrolyzed vegetable protein (HVP) injected as part of the basting, and any cheese product containing oat gum as an ingredient. Seitan. Imitation fish. Dairy Commercial chocolate milk, which may have cereal added, and malted milk. Beverages Certain cereal beverages. Beer and ciders (unless GF), ale, malted milk, and some root beers. Sweetsand Desserts Commercial candies containing wheat, rye, barley, or triticale. Certain toffees are dusted with wheat flour. Chocolate-coated nuts, which are often rolled in flour. Cakes, cookies, doughnuts, and pastries that are prepared with wheat, barley, rye, or triticale flour. Some commercial ice creams, ice cream flavors which contain cookies, crumbs, or cheesecake. Ice cream cones. Commercially prepared mixes for cakes, cookies, and other desserts unless marked GF. Bread pudding and other puddings thickened with flour. Fats and Oils Some commercial salad dressings and sour cream containing modified food starch.  Condiments Some curry powder, some dry seasoning mixes, some gravy extracts, some meat sauces, some ketchup, some prepared mustard, horseradish. Other All soups containing wheat,   rye, barley, or triticale flour. Bouillon and bouillon  cubes that contain HVP. Combination or prepared foods that contain gluten. Some soy sauce, some chip dips, and some chewing gum. Yeast extract (contains barley). Caramel color (may contain malt). The items listed above may not be a complete list of foods and beverages to avoid. Contact your dietitian for more information.   This information is not intended to replace advice given to you by your health care provider. Make sure you discuss any questions you have with your health care provider.   Document Released: 01/03/2005 Document Revised: 01/24/2014 Document Reviewed: 11/07/2012 Elsevier Interactive Patient Education 2016 [employer]  

## 2015-01-16 ENCOUNTER — Telehealth: Payer: Self-pay

## 2015-01-16 MED ORDER — METRONIDAZOLE 500 MG PO TABS: 500.0000 mg | ORAL_TABLET | Freq: Two times a day (BID) | ORAL | Status: DC

## 2015-01-16 NOTE — Telephone Encounter (Signed)
Pt called wanting the results of her wet prep. Labs reviewed by Dr.Vinncent and rx sent in per Dr.Vincennt who was Dr.Stacks covering provider.

## 2015-01-19 NOTE — Progress Notes (Signed)
Erroneous

## 2015-01-22 ENCOUNTER — Telehealth: Payer: Self-pay | Admitting: Family Medicine

## 2015-01-22 NOTE — Telephone Encounter (Signed)
Xanax rx called into pharmacy and pt is aware.

## 2015-02-13 ENCOUNTER — Telehealth: Payer: Self-pay | Admitting: Family Medicine

## 2015-02-13 MED ORDER — METRONIDAZOLE 500 MG PO TABS: 500.0000 mg | ORAL_TABLET | Freq: Two times a day (BID) | ORAL | Status: DC

## 2015-02-13 NOTE — Telephone Encounter (Signed)
I sent in a prescription for infection. However, to answer the question of prevention she needs to be seen.

## 2015-02-13 NOTE — Telephone Encounter (Signed)
Left detailed message stating rx sent over but if she wants to discuss prevention she would need to be seen and to CB with any further questions or concerns.

## 2015-02-17 ENCOUNTER — Telehealth: Payer: Self-pay | Admitting: Family Medicine

## 2015-02-17 NOTE — Telephone Encounter (Signed)
Detailed message left that she is due for her B12 now and to please call and schedule an appointment.

## 2015-02-18 ENCOUNTER — Ambulatory Visit (INDEPENDENT_AMBULATORY_CARE_PROVIDER_SITE_OTHER): Payer: 59 | Admitting: *Deleted

## 2015-02-18 DIAGNOSIS — Z029 Encounter for administrative examinations, unspecified: Secondary | ICD-10-CM

## 2015-02-18 DIAGNOSIS — E538 Deficiency of other specified B group vitamins: Secondary | ICD-10-CM | POA: Diagnosis not present

## 2015-02-19 ENCOUNTER — Telehealth: Payer: Self-pay | Admitting: Nurse Practitioner

## 2015-02-19 NOTE — Telephone Encounter (Signed)
noted 

## 2015-03-20 ENCOUNTER — Ambulatory Visit: Payer: 59 | Admitting: Family Medicine

## 2015-03-20 ENCOUNTER — Telehealth: Payer: Self-pay | Admitting: *Deleted

## 2015-03-22 ENCOUNTER — Other Ambulatory Visit: Payer: Self-pay | Admitting: Family Medicine

## 2015-03-23 MED ORDER — METRONIDAZOLE 500 MG PO TABS: 500.0000 mg | ORAL_TABLET | Freq: Two times a day (BID) | ORAL | Status: DC

## 2015-03-23 NOTE — Telephone Encounter (Signed)
Patient informed that medication was sent in to pharmacy

## 2015-03-23 NOTE — Addendum Note (Signed)
Addended by: Claretta Fraise on: 03/23/2015 01:21 PM   Modules accepted: Orders

## 2015-03-31 ENCOUNTER — Encounter: Payer: Self-pay | Admitting: Family Medicine

## 2015-03-31 ENCOUNTER — Ambulatory Visit (INDEPENDENT_AMBULATORY_CARE_PROVIDER_SITE_OTHER): Payer: 59 | Admitting: Family Medicine

## 2015-03-31 VITALS — BP 118/78 | HR 85 | Temp 97.8°F | Ht 66.0 in | Wt 204.4 lb

## 2015-03-31 DIAGNOSIS — E538 Deficiency of other specified B group vitamins: Secondary | ICD-10-CM

## 2015-03-31 DIAGNOSIS — E785 Hyperlipidemia, unspecified: Secondary | ICD-10-CM | POA: Diagnosis not present

## 2015-03-31 DIAGNOSIS — E782 Mixed hyperlipidemia: Secondary | ICD-10-CM | POA: Insufficient documentation

## 2015-03-31 DIAGNOSIS — E559 Vitamin D deficiency, unspecified: Secondary | ICD-10-CM | POA: Diagnosis not present

## 2015-03-31 DIAGNOSIS — F419 Anxiety disorder, unspecified: Secondary | ICD-10-CM

## 2015-03-31 DIAGNOSIS — K219 Gastro-esophageal reflux disease without esophagitis: Secondary | ICD-10-CM | POA: Diagnosis not present

## 2015-03-31 MED ORDER — ALPRAZOLAM 0.5 MG PO TABS: ORAL_TABLET | ORAL | Status: DC

## 2015-03-31 NOTE — Progress Notes (Signed)
Subjective:  Patient ID: Kathy Ewing, female    DOB: June 26, 1970  Age: 45 y.o. MRN: [id number]  CC: 3 month follow up   HPI Kathy Ewing presents for bacterial infection. Just treated. Sx gone wonders why it keeps coming back. Denies depression. Discussion of anxiety symptoms focused around the GAD 7 which is noted as below:  GAD 7 : Generalized Anxiety Score 03/31/2015 01/14/2015  Nervous, Anxious, on Edge 2 3  Control/stop worrying 3 2  Worry too much - different things 3 2  Trouble relaxing 2 1  Restless 1 0  Easily annoyed or irritable 2 0  Afraid - awful might happen 3 0  Total GAD 7 Score 16 8  Anxiety Difficulty Very difficult Not difficult at all    Her alprazolam agrees with her adequately. She is taking it regularly.Energy level is adequate as long as she takes her B12 shots. She missed one a few days ago and is due to have that today. Additionally she is also due to have her vitamin D level checked. It is been low in the past. She is concerned about bone health but denies any significant symptoms related to her vitamin D  Patient in for follow-up of GERD. Currently asymptomatic taking  PPI daily. There is no chest pain or heartburn. No hematemesis and no melena. No dysphagia or choking. Onset is remote. Progression is stable. Complicating factors, none.     History [name] has a past medical history of B12 deficiency; Vitamin D deficiency; Anxiety; Numbness; Headache; Pneumonia; GERD (gastroesophageal reflux disease); High cholesterol; and Cholelithiasis.   She has past surgical history that includes Cesarean section and Cholecystectomy (N/A, 10/02/2014).   Her family history includes Anxiety disorder in her brother; Cerebral palsy in her daughter; Depression in her daughter; Hypertension in her mother; Migraines in her sister; Multiple sclerosis in her sister.She reports that she has been smoking Cigarettes.  She has a 15 pack-year smoking history. She has never used  smokeless tobacco. She reports that she does not drink alcohol or use illicit drugs.    ROS Review of Systems  Constitutional: Negative for fever, activity change and appetite change.  HENT: Negative for congestion, rhinorrhea and sore throat.   Eyes: Negative for visual disturbance.  Respiratory: Negative for cough and shortness of breath.   Cardiovascular: Negative for chest pain and palpitations.  Gastrointestinal: Negative for nausea, abdominal pain and diarrhea.  Genitourinary: Negative for dysuria.  Musculoskeletal: Negative for myalgias and arthralgias.  Psychiatric/Behavioral: Positive for decreased concentration. Negative for behavioral problems, confusion, dysphoric mood and agitation. The patient is nervous/anxious.     Objective:  BP 118/78 mmHg  Pulse 85  Temp(Src) 97.8 F (36.6 C) (Oral)  Ht 5\' 6"  (1.676 m)  Wt 204 lb 6 oz (92.704 kg)  BMI 33.00 kg/m2  LMP 02/27/2015 (Approximate)  BP Readings from Last 3 Encounters:  03/31/15 118/78  01/14/15 98/70  11/25/14 108/79    Wt Readings from Last 3 Encounters:  03/31/15 204 lb 6 oz (92.704 kg)  01/14/15 196 lb 12.8 oz (89.268 kg)  11/25/14 191 lb 6.4 oz (86.818 kg)     Physical Exam  Constitutional: She is oriented to person, place, and time. She appears well-developed and well-nourished. No distress.  HENT:  Head: Normocephalic and atraumatic.  Eyes: Conjunctivae are normal. Pupils are equal, round, and reactive to light.  Neck: Normal range of motion. Neck supple. No thyromegaly present.  Cardiovascular: Normal rate, regular rhythm and normal heart sounds.  No murmur heard. Pulmonary/Chest: Effort normal and breath sounds normal. No respiratory distress. She has no wheezes. She has no rales.  Abdominal: Soft. Bowel sounds are normal. She exhibits no distension. There is no tenderness.  Musculoskeletal: Normal range of motion.  Lymphadenopathy:    She has no cervical adenopathy.  Neurological: She is  alert and oriented to person, place, and time.  Skin: Skin is warm and dry.  Psychiatric: She has a normal mood and affect. Her behavior is normal. Judgment and thought content normal.     Lab Results  Component Value Date   WBC 11.6* 11/25/2014   HGB 13.3 10/02/2014   HCT 41.3 11/25/2014   PLT 302 11/25/2014   GLUCOSE 82 11/25/2014   CHOL 100 09/11/2014   TRIG 62 09/11/2014   HDL 41 09/11/2014   LDLCALC 47 09/11/2014   ALT 18 11/25/2014   AST 16 11/25/2014   NA 141 11/25/2014   K 4.2 11/25/2014   CL 102 11/25/2014   CREATININE 0.67 11/25/2014   BUN 8 11/25/2014   CO2 24 11/25/2014   TSH 3.180 02/19/2014    Ct Abdomen Pelvis W Contrast  10/28/2014  CLINICAL DATA:  Lower abdominal pain, nausea. Prior cholecystectomy 10/02/2014. EXAM: CT ABDOMEN AND PELVIS WITH CONTRAST TECHNIQUE: Multidetector CT imaging of the abdomen and pelvis was performed using the standard protocol following bolus administration of intravenous contrast. CONTRAST:  1 OMNIPAQUE IOHEXOL 300 MG/ML SOLN, 161mL ISOVUE-300 IOPAMIDOL (ISOVUE-300) INJECTION 61% COMPARISON:  Ultrasound 09/15/2014 FINDINGS: Lung bases are clear.  No effusions.  Heart is normal size. Small cysts scattered throughout the liver which appear benign. Prior cholecystectomy. Spleen, pancreas, adrenals and kidneys are normal. Bowel grossly unremarkable. No free fluid, free air, or adenopathy. Appendix is visualized and is normal. Uterus, adnexae and urinary bladder normal. No acute bony abnormality or focal bone lesion. IMPRESSION: No acute findings in the abdomen or pelvis. No complicating feature from recent cholecystectomy. Scattered small benign-appearing hepatic cysts. Electronically Signed   By: Rolm Baptise M.D.   On: 10/28/2014 10:37    Assessment & Plan:   Myanna was seen today for 3 month follow up.  Diagnoses and all orders for this visit:  Anxiety  Gastroesophageal reflux disease without esophagitis  Hyperlipidemia  Vitamin  B12 deficiency  Vitamin D deficiency  Other orders -     Discontinue: ALPRAZolam (XANAX) 0.5 MG tablet; TAKE 1 TO 2 TABLETS BY MOUTH TWICE DAILYAS NEEDED FOR ANXIETY -     ALPRAZolam (XANAX) 0.5 MG tablet; TAKE 1 TO 2 TABLETS BY MOUTH TWICE DAILYAS NEEDED FOR ANXIETY      I have discontinued Ms. Westling's frovatriptan, metoCLOPramide, Vitamin D (Ergocalciferol), lubiprostone, nabumetone, and metroNIDAZOLE. I am also having her maintain her omeprazole, atorvastatin, and ALPRAZolam. We will continue to administer cyanocobalamin.  Meds ordered this encounter  Medications  . DISCONTD: ALPRAZolam (XANAX) 0.5 MG tablet    Sig: TAKE 1 TO 2 TABLETS BY MOUTH TWICE DAILYAS NEEDED FOR ANXIETY    Dispense:  60 tablet    Refill:  2  . ALPRAZolam (XANAX) 0.5 MG tablet    Sig: TAKE 1 TO 2 TABLETS BY MOUTH TWICE DAILYAS NEEDED FOR ANXIETY    Dispense:  60 tablet    Refill:  2     Follow-up: Return in about 3 months (around 07/01/2015).  Claretta Fraise, M.D.

## 2015-04-04 ENCOUNTER — Other Ambulatory Visit: Payer: 59

## 2015-04-04 ENCOUNTER — Other Ambulatory Visit: Payer: Self-pay | Admitting: Family Medicine

## 2015-04-04 DIAGNOSIS — E785 Hyperlipidemia, unspecified: Secondary | ICD-10-CM

## 2015-04-04 DIAGNOSIS — E559 Vitamin D deficiency, unspecified: Secondary | ICD-10-CM

## 2015-04-10 LAB — CBC WITH DIFFERENTIAL/PLATELET
Basophils Absolute: 0.1 10*3/uL (ref 0.0–0.2)
Basos: 1 %
EOS (ABSOLUTE): 0.3 10*3/uL (ref 0.0–0.4)
EOS: 6 %
HEMATOCRIT: 40.5 % (ref 34.0–46.6)
HEMOGLOBIN: 13.2 g/dL (ref 11.1–15.9)
Immature Grans (Abs): 0 10*3/uL (ref 0.0–0.1)
Immature Granulocytes: 0 %
LYMPHS ABS: 1.3 10*3/uL (ref 0.7–3.1)
Lymphs: 23 %
MCH: 29.4 pg (ref 26.6–33.0)
MCHC: 32.6 g/dL (ref 31.5–35.7)
MCV: 90 fL (ref 79–97)
MONOS ABS: 0.7 10*3/uL (ref 0.1–0.9)
Monocytes: 12 %
NEUTROS ABS: 3.4 10*3/uL (ref 1.4–7.0)
Neutrophils: 58 %
Platelets: 326 10*3/uL (ref 150–379)
RBC: 4.49 x10E6/uL (ref 3.77–5.28)
RDW: 14.8 % (ref 12.3–15.4)
WBC: 5.8 10*3/uL (ref 3.4–10.8)

## 2015-04-10 LAB — LIPID PANEL
CHOLESTEROL TOTAL: 128 mg/dL (ref 100–199)
Chol/HDL Ratio: 3.3 ratio units (ref 0.0–4.4)
HDL: 39 mg/dL — ABNORMAL LOW (ref >39)
LDL Calculated: 73 mg/dL (ref 0–99)
TRIGLYCERIDES: 79 mg/dL (ref 0–149)
VLDL Cholesterol Cal: 16 mg/dL (ref 5–40)

## 2015-04-10 LAB — CMP14+EGFR
ALBUMIN: 4 g/dL (ref 3.5–5.5)
ALK PHOS: 95 IU/L (ref 39–117)
ALT: 19 IU/L (ref 0–32)
AST: 23 IU/L (ref 0–40)
Albumin/Globulin Ratio: 1.7 (ref 1.2–2.2)
BILIRUBIN TOTAL: 0.5 mg/dL (ref 0.0–1.2)
BUN / CREAT RATIO: 7 — AB (ref 9–23)
BUN: 6 mg/dL (ref 6–24)
CO2: 21 mmol/L (ref 18–29)
Calcium: 8.4 mg/dL — ABNORMAL LOW (ref 8.7–10.2)
Chloride: 103 mmol/L (ref 96–106)
Creatinine, Ser: 0.82 mg/dL (ref 0.57–1.00)
GFR calc Af Amer: 102 mL/min/{1.73_m2} (ref >59)
GFR calc non Af Amer: 89 mL/min/{1.73_m2} (ref >59)
GLOBULIN, TOTAL: 2.3 g/dL (ref 1.5–4.5)
Glucose: 90 mg/dL (ref 65–99)
POTASSIUM: 4.2 mmol/L (ref 3.5–5.2)
SODIUM: 140 mmol/L (ref 134–144)
Total Protein: 6.3 g/dL (ref 6.0–8.5)

## 2015-04-10 LAB — VITAMIN D 1,25 DIHYDROXY
VITAMIN D2 1, 25 (OH): 23 pg/mL
VITAMIN D3 1, 25 (OH): 23 pg/mL
Vitamin D 1, 25 (OH)2 Total: 46 pg/mL

## 2015-04-17 ENCOUNTER — Ambulatory Visit: Payer: 59 | Admitting: Family Medicine

## 2015-05-02 ENCOUNTER — Other Ambulatory Visit: Payer: Self-pay | Admitting: Family Medicine

## 2015-05-04 NOTE — Telephone Encounter (Signed)
Last seen 03/31/15  Dr Livia Snellen

## 2015-05-08 ENCOUNTER — Ambulatory Visit (INDEPENDENT_AMBULATORY_CARE_PROVIDER_SITE_OTHER): Payer: 59 | Admitting: *Deleted

## 2015-05-08 ENCOUNTER — Ambulatory Visit: Payer: 59

## 2015-05-08 ENCOUNTER — Ambulatory Visit (INDEPENDENT_AMBULATORY_CARE_PROVIDER_SITE_OTHER): Payer: 59

## 2015-05-08 DIAGNOSIS — M79672 Pain in left foot: Secondary | ICD-10-CM | POA: Diagnosis not present

## 2015-05-08 DIAGNOSIS — M79671 Pain in right foot: Secondary | ICD-10-CM

## 2015-05-08 DIAGNOSIS — E538 Deficiency of other specified B group vitamins: Secondary | ICD-10-CM

## 2015-05-08 NOTE — Patient Instructions (Signed)

## 2015-05-08 NOTE — Progress Notes (Signed)
Vitamin b12 injection given and tolerated well. Patient is complaining with bilateral heel pain and she has appointment on Saturday morning so Christy wanted to go ahead with xray's this evening.

## 2015-05-09 ENCOUNTER — Ambulatory Visit (INDEPENDENT_AMBULATORY_CARE_PROVIDER_SITE_OTHER): Payer: 59 | Admitting: Family

## 2015-05-09 VITALS — BP 105/76 | HR 84 | Temp 97.1°F | Ht 66.0 in | Wt 204.2 lb

## 2015-05-09 DIAGNOSIS — M722 Plantar fascial fibromatosis: Secondary | ICD-10-CM | POA: Diagnosis not present

## 2015-05-09 MED ORDER — PREDNISONE 10 MG (21) PO TBPK: 10.0000 mg | ORAL_TABLET | Freq: Every day | ORAL | Status: DC

## 2015-05-09 MED ORDER — NAPROXEN 500 MG PO TABS: 500.0000 mg | ORAL_TABLET | Freq: Two times a day (BID) | ORAL | Status: DC

## 2015-05-09 NOTE — Progress Notes (Signed)
   Subjective:    Patient ID: Kathy Ewing, female    DOB: [birth year], 45 y.o.   MRN: [id number]  HPI Pt  Presents to the office today with bilateral heel pain. Pt states this pain is constant that started a few months ago, but has become worse. PT states her left foot is worse and is a 10 out 10. Pt states at times the pain radiates up her leg and it is hard for her to walk. PT states she has tired getting good shoe support and inserts and tylenol prn with mild relief.    Review of Systems  All other systems reviewed and are negative.      Objective:   Physical Exam  Constitutional: She is oriented to person, place, and time. She appears well-developed and well-nourished. No distress.  HENT:  Head: Normocephalic and atraumatic.  Eyes: Pupils are equal, round, and reactive to light.  Neck: Normal range of motion. Neck supple. No thyromegaly present.  Cardiovascular: Normal rate, regular rhythm, normal heart sounds and intact distal pulses.   No murmur heard. Pulmonary/Chest: Effort normal and breath sounds normal. No respiratory distress. She has no wheezes.  Abdominal: Soft. Bowel sounds are normal. She exhibits no distension. There is no tenderness.  Musculoskeletal: Normal range of motion. She exhibits no edema or tenderness.  Neurological: She is alert and oriented to person, place, and time.  Skin: Skin is warm and dry.  Psychiatric: She has a normal mood and affect. Her behavior is normal. Judgment and thought content normal.  Vitals reviewed.   BP 105/76 mmHg  Pulse 84  Temp(Src) 97.1 F (36.2 C) (Oral)  Ht 5\' 6"  (1.676 m)  Wt 204 lb 3.2 oz (92.625 kg)  BMI 32.97 kg/m2  Xray right foot small heel spur, left foot negative     Assessment & Plan:  1. Plantar fasciitis, bilateral -Rest -Freeze bottle of water and go from heel to toe 3-4 times a day -Good shoe support discussed -RTO In 2 weeks if not improved will injected heel - predniSONE (STERAPRED UNI-PAK 21  TAB) 10 MG (21) TBPK tablet; Take 1 tablet (10 mg total) by mouth daily. As directed x 6 days  Dispense: 21 tablet; Refill: 0 - naproxen (NAPROSYN) 500 MG tablet; Take 1 tablet (500 mg total) by mouth 2 (two) times daily with a meal.  Dispense: 60 tablet; Refill: [place], FNP

## 2015-05-09 NOTE — Patient Instructions (Signed)
Plantar Fasciitis Plantar fasciitis is a painful foot condition that affects the heel. It occurs when the band of tissue that connects the toes to the heel bone (plantar fascia) becomes irritated. This can happen after exercising too much or doing other repetitive activities (overuse injury). The pain from plantar fasciitis can range from mild irritation to severe pain that makes it difficult for you to walk or move. The pain is usually worse in the morning or after you have been sitting or lying down for a while. CAUSES This condition may be caused by:  Standing for long periods of time.  Wearing shoes that do not fit.  Doing high-impact activities, including running, aerobics, and ballet.  Being overweight.  Having an abnormal way of walking (gait).  Having tight calf muscles.  Having high arches in your feet.  Starting a new athletic activity. SYMPTOMS The main symptom of this condition is heel pain. Other symptoms include:  Pain that gets worse after activity or exercise.  Pain that is worse in the morning or after resting.  Pain that goes away after you walk for a few minutes. DIAGNOSIS This condition may be diagnosed based on your signs and symptoms. Your health care provider will also do a physical exam to check for:  A tender area on the bottom of your foot.  A high arch in your foot.  Pain when you move your foot.  Difficulty moving your foot. You may also need to have imaging studies to confirm the diagnosis. These can include:  X-rays.  Ultrasound.  MRI. TREATMENT  Treatment for plantar fasciitis depends on the severity of the condition. Your treatment may include:  Rest, ice, and over-the-counter pain medicines to manage your pain.  Exercises to stretch your calves and your plantar fascia.  A splint that holds your foot in a stretched, upward position while you sleep (night splint).  Physical therapy to relieve symptoms and prevent problems in the  future.  Cortisone injections to relieve severe pain.  Extracorporeal shock wave therapy (ESWT) to stimulate damaged plantar fascia with electrical impulses. It is often used as a last resort before surgery.  Surgery, if other treatments have not worked after 12 months. HOME CARE INSTRUCTIONS  Take medicines only as directed by your health care provider.  Avoid activities that cause pain.  Roll the bottom of your foot over a bag of ice or a bottle of cold water. Do this for 20 minutes, 3-4 times a day.  Perform simple stretches as directed by your health care provider.  Try wearing athletic shoes with air-sole or gel-sole cushions or soft shoe inserts.  Wear a night splint while sleeping, if directed by your health care provider.  Keep all follow-up appointments with your health care provider. PREVENTION   Do not perform exercises or activities that cause heel pain.  Consider finding low-impact activities if you continue to have problems.  Lose weight if you need to. The best way to prevent plantar fasciitis is to avoid the activities that aggravate your plantar fascia. SEEK MEDICAL CARE IF:  Your symptoms do not go away after treatment with home care measures.  Your pain gets worse.  Your pain affects your ability to move or do your daily activities.   This information is not intended to replace advice given to you by your health care provider. Make sure you discuss any questions you have with your health care provider.   Document Released: 09/28/2000 Document Revised: 09/24/2014 Document Reviewed: 11/13/2013 Elsevier   Interactive Patient Education 2016 [employer]  

## 2015-05-25 ENCOUNTER — Ambulatory Visit: Payer: 59 | Admitting: Family

## 2015-06-26 ENCOUNTER — Encounter: Payer: Self-pay | Admitting: Family Medicine

## 2015-06-26 ENCOUNTER — Ambulatory Visit (INDEPENDENT_AMBULATORY_CARE_PROVIDER_SITE_OTHER): Payer: 59 | Admitting: Family Medicine

## 2015-06-26 VITALS — BP 115/78 | HR 78 | Temp 97.0°F | Ht 66.0 in | Wt 210.0 lb

## 2015-06-26 DIAGNOSIS — R935 Abnormal findings on diagnostic imaging of other abdominal regions, including retroperitoneum: Secondary | ICD-10-CM | POA: Diagnosis not present

## 2015-06-26 DIAGNOSIS — E538 Deficiency of other specified B group vitamins: Secondary | ICD-10-CM | POA: Diagnosis not present

## 2015-06-26 DIAGNOSIS — M722 Plantar fascial fibromatosis: Secondary | ICD-10-CM

## 2015-06-26 MED ORDER — PREDNISONE 10 MG PO TABS: ORAL_TABLET | ORAL | Status: DC

## 2015-06-26 NOTE — Progress Notes (Signed)
Subjective:  Patient ID: Kathy Ewing, female    DOB: [birth year]  Age: 45 y.o. MRN: [id number]  CC: Foot Pain   HPI Kathy Ewing presents for foot pain ongoing 2 months. Has to walk on them all day every day  At work.  The left heel pain is 10 out of 10 all day every day. It is located at the plantar surface posteriorly at the plantar origin. It starts as soon as she gets up in her feet hit the floor. The right foot has pain at the heel as well. It is 3/10. The pain seems to be worsening in spite of previous treatment. Additionally she continues to do stretching exercises with no relief.   History Kathy Ewing has a past medical history of B12 deficiency; Vitamin D deficiency; Anxiety; Numbness; Headache; Pneumonia; GERD (gastroesophageal reflux disease); High cholesterol; and Cholelithiasis.   She has past surgical history that includes Cesarean section and Cholecystectomy (N/A, 10/02/2014).   Her family history includes Anxiety disorder in her brother; Cerebral palsy in her daughter; Depression in her daughter; Hypertension in her mother; Migraines in her sister; Multiple sclerosis in her sister.She reports that she has been smoking Cigarettes.  She has a 15 pack-year smoking history. She has never used smokeless tobacco. She reports that she does not drink alcohol or use illicit drugs.    ROS Review of Systems  Constitutional: Negative for fever, activity change and appetite change.  HENT: Negative for congestion, rhinorrhea and sore throat.   Eyes: Negative for visual disturbance.  Respiratory: Negative for cough and shortness of breath.   Cardiovascular: Negative for chest pain and palpitations.  Gastrointestinal: Negative for nausea, abdominal pain and diarrhea.  Genitourinary: Negative for dysuria.  Musculoskeletal: Positive for myalgias and arthralgias.  Psychiatric/Behavioral: Decreased concentration: .injj.    Objective:  BP 115/78 mmHg  Pulse 78  Temp(Src) 97 F (36.1  C) (Oral)  Ht 5\' 6"  (1.676 m)  Wt 210 lb (95.255 kg)  BMI 33.91 kg/m2  SpO2 98%  LMP 05/26/2015 (Approximate)  BP Readings from Last 3 Encounters:  06/26/15 115/78  05/09/15 105/76  03/31/15 118/78    Wt Readings from Last 3 Encounters:  06/26/15 210 lb (95.255 kg)  05/09/15 204 lb 3.2 oz (92.625 kg)  03/31/15 204 lb 6 oz (92.704 kg)     Physical Exam  Constitutional: She appears well-developed and well-nourished.  HENT:  Head: Normocephalic.  Cardiovascular: Normal rate and regular rhythm.   No murmur heard. Pulmonary/Chest: Effort normal and breath sounds normal.  Musculoskeletal: She exhibits tenderness (plantar origin at heel bilat. L>R).     Lab Results  Component Value Date   WBC 5.8 04/04/2015   HGB 13.3 10/02/2014   HCT 40.5 04/04/2015   PLT 326 04/04/2015   GLUCOSE 90 04/04/2015   CHOL 128 04/04/2015   TRIG 79 04/04/2015   HDL 39* 04/04/2015   LDLCALC 73 04/04/2015   ALT 19 04/04/2015   AST 23 04/04/2015   NA 140 04/04/2015   K 4.2 04/04/2015   CL 103 04/04/2015   CREATININE 0.82 04/04/2015   BUN 6 04/04/2015   CO2 21 04/04/2015   TSH 3.180 02/19/2014    Ct Abdomen Pelvis W Contrast  10/28/2014  CLINICAL DATA:  Lower abdominal pain, nausea. Prior cholecystectomy 10/02/2014. EXAM: CT ABDOMEN AND PELVIS WITH CONTRAST TECHNIQUE: Multidetector CT imaging of the abdomen and pelvis was performed using the standard protocol following bolus administration of intravenous contrast. CONTRAST:  1 OMNIPAQUE IOHEXOL 300  MG/ML SOLN, 174mL ISOVUE-300 IOPAMIDOL (ISOVUE-300) INJECTION 61% COMPARISON:  Ultrasound 09/15/2014 FINDINGS: Lung bases are clear.  No effusions.  Heart is normal size. Small cysts scattered throughout the liver which appear benign. Prior cholecystectomy. Spleen, pancreas, adrenals and kidneys are normal. Bowel grossly unremarkable. No free fluid, free air, or adenopathy. Appendix is visualized and is normal. Uterus, adnexae and urinary bladder  normal. No acute bony abnormality or focal bone lesion. IMPRESSION: No acute findings in the abdomen or pelvis. No complicating feature from recent cholecystectomy. Scattered small benign-appearing hepatic cysts. Electronically Signed   By: Rolm Baptise M.D.   On: 10/28/2014 10:37    Assessment & Plan:   Kathy Ewing was seen today for foot pain.  Diagnoses and all orders for this visit:  Abnormal [place] (ultrasound) of abdomen -     US Abdomen Limited RUQ; Future  Other orders -     predniSONE (DELTASONE) 10 MG tablet; Take 5 daily for 3 days followed by 4,3,2 and 1 for 3 days each.   A steroid injection was performed at left plantar fascia origin with sterile prep using 1cc marcan and 6 mg of [place]. This was well tolerated.   I have discontinued Kathy Ewing's predniSONE. I am also having her start on predniSONE. Additionally, I am having her maintain her omeprazole, atorvastatin, ALPRAZolam, and naproxen. We administered cyanocobalamin. We will continue to administer cyanocobalamin.  Meds ordered this encounter  Medications  . predniSONE (DELTASONE) 10 MG tablet    Sig: Take 5 daily for 3 days followed by 4,3,2 and 1 for 3 days each.    Dispense:  45 tablet    Refill:  0     Follow-up: No Follow-up on file.  Claretta Fraise, M.D.

## 2015-07-17 ENCOUNTER — Telehealth: Payer: Self-pay | Admitting: Family Medicine

## 2015-07-17 NOTE — Telephone Encounter (Signed)
We don't usually prescribe antibiotics over the phone because usually we need to see someone to know what to prescribe and if they need antibiotics. What are her symptoms?

## 2015-07-17 NOTE — Telephone Encounter (Signed)
Left message on VM that she NTBS & we will be here tomorrow from 8-12

## 2015-07-17 NOTE — Telephone Encounter (Signed)
Pt is c/o foul smelling yellow vaginal discharge and she says she gets them all the time.

## 2015-07-17 NOTE — Telephone Encounter (Signed)
She needs to be seen for an exam.

## 2015-07-28 ENCOUNTER — Encounter: Payer: Self-pay | Admitting: Family

## 2015-07-28 ENCOUNTER — Ambulatory Visit (INDEPENDENT_AMBULATORY_CARE_PROVIDER_SITE_OTHER): Payer: 59 | Admitting: Family

## 2015-07-28 VITALS — BP 108/70 | HR 96 | Temp 97.8°F | Ht 66.0 in | Wt 212.0 lb

## 2015-07-28 DIAGNOSIS — A499 Bacterial infection, unspecified: Secondary | ICD-10-CM

## 2015-07-28 DIAGNOSIS — R300 Dysuria: Secondary | ICD-10-CM

## 2015-07-28 DIAGNOSIS — N898 Other specified noninflammatory disorders of vagina: Secondary | ICD-10-CM

## 2015-07-28 DIAGNOSIS — R1011 Right upper quadrant pain: Secondary | ICD-10-CM

## 2015-07-28 DIAGNOSIS — N760 Acute vaginitis: Secondary | ICD-10-CM | POA: Diagnosis not present

## 2015-07-28 DIAGNOSIS — B9689 Other specified bacterial agents as the cause of diseases classified elsewhere: Secondary | ICD-10-CM

## 2015-07-28 LAB — URINALYSIS, COMPLETE
BILIRUBIN UA: NEGATIVE
Glucose, UA: NEGATIVE
KETONES UA: NEGATIVE
Leukocytes, UA: NEGATIVE
NITRITE UA: NEGATIVE
Protein, UA: NEGATIVE
RBC UA: NEGATIVE
SPEC GRAV UA: 1.02 (ref 1.005–1.030)
UUROB: 0.2 mg/dL (ref 0.2–1.0)
pH, UA: 7 (ref 5.0–7.5)

## 2015-07-28 LAB — MICROSCOPIC EXAMINATION: RBC MICROSCOPIC, UA: NONE SEEN /HPF (ref 0–?)

## 2015-07-28 LAB — WET PREP FOR TRICH, YEAST, CLUE
Clue Cell Exam: NEGATIVE
Trichomonas Exam: NEGATIVE
Yeast Exam: NEGATIVE

## 2015-07-28 MED ORDER — ALPRAZOLAM 0.5 MG PO TABS: ORAL_TABLET | ORAL | Status: DC

## 2015-07-28 MED ORDER — METRONIDAZOLE 500 MG PO TABS: 500.0000 mg | ORAL_TABLET | Freq: Two times a day (BID) | ORAL | Status: DC

## 2015-07-28 NOTE — Progress Notes (Signed)
Subjective:    Patient ID: Kathy Ewing, female    DOB: February 12, 1970, 45 y.o.   MRN: [id number]  Abdominal Pain This is a new problem. The current episode started in the past 7 days. The problem occurs constantly. The problem has been waxing and waning. The pain is located in the RUQ. The quality of the pain is aching and dull. The abdominal pain does not radiate. Pertinent negatives include no belching, constipation, diarrhea, dysuria, frequency, headaches, hematuria, nausea or vomiting. The pain is relieved by certain positions. She has tried acetaminophen for the symptoms. The treatment provided mild relief.  Vaginal Discharge The patient's primary symptoms include a genital odor and vaginal discharge. The patient's pertinent negatives include no genital itching. This is a new problem. The current episode started 1 to 4 weeks ago. The problem occurs intermittently. The problem has been waxing and waning. Associated symptoms include abdominal pain. Pertinent negatives include no back pain, chills, constipation, diarrhea, dysuria, frequency, headaches, hematuria, nausea or vomiting. The vaginal discharge was yellow and clear.      Review of Systems  Constitutional: Negative.  Negative for chills.  HENT: Negative.   Eyes: Negative.   Respiratory: Negative.  Negative for shortness of breath.   Cardiovascular: Negative.  Negative for palpitations.  Gastrointestinal: Positive for abdominal pain. Negative for nausea, vomiting, diarrhea and constipation.  Endocrine: Negative.   Genitourinary: Positive for vaginal discharge. Negative for dysuria, frequency and hematuria.  Musculoskeletal: Negative.  Negative for back pain.  Neurological: Negative.  Negative for headaches.  Hematological: Negative.   Psychiatric/Behavioral: Negative.   All other systems reviewed and are negative.      Objective:   Physical Exam  Constitutional: She is oriented to person, place, and time. She appears  well-developed and well-nourished. No distress.  HENT:  Head: Normocephalic and atraumatic.  Eyes: Pupils are equal, round, and reactive to light.  Neck: Normal range of motion. Neck supple. No thyromegaly present.  Cardiovascular: Normal rate, regular rhythm, normal heart sounds and intact distal pulses.   No murmur heard. Pulmonary/Chest: Effort normal and breath sounds normal. No respiratory distress. She has no wheezes.  Abdominal: Soft. Bowel sounds are normal. She exhibits no distension (mild RLQ ). There is tenderness. There is no rebound and no guarding.  Musculoskeletal: Normal range of motion. She exhibits no edema or tenderness.  Neurological: She is alert and oriented to person, place, and time. She has normal reflexes. No cranial nerve deficit.  Skin: Skin is warm and dry.  Psychiatric: She has a normal mood and affect. Her behavior is normal. Judgment and thought content normal.  Vitals reviewed.     BP 108/70 mmHg  Pulse 96  Temp(Src) 97.8 F (36.6 C) (Oral)  Ht 5\' 6"  (1.676 m)  Wt 212 lb (96.163 kg)  BMI 34.23 kg/m2  LMP 07/16/2015 \    Assessment & Plan:  1. Vaginal discharge - WET PREP FOR TRICH, YEAST, CLUE - CBC with Differential/Platelet  2. Dysuria - Urinalysis, Complete - CBC with Differential/Platelet  3. BV (bacterial vaginosis) -Keep clean and dry -Start probiotic  - metroNIDAZOLE (FLAGYL) 500 MG tablet; Take 1 tablet (500 mg total) by mouth 2 (two) times daily.  Dispense: 14 tablet; Refill: 0 - CBC with Differential/Platelet  4. Right upper quadrant pain -No fever, n&V at this time. Will check CBC to rule out any infection. WIll treat BV. RTO if abd worsens. Pt does not have gallbladder.  - ALPRAZolam (XANAX) 0.5 MG tablet; TAKE  1 TO 2 TABLETS BY MOUTH TWICE DAILYAS NEEDED FOR ANXIETY  Dispense: 60 tablet; Refill: 2 - metroNIDAZOLE (FLAGYL) 500 MG tablet; Take 1 tablet (500 mg total) by mouth 2 (two) times daily.  Dispense: 14 tablet; Refill:  0 - CBC with Differential/Platelet  [name], FNP

## 2015-07-28 NOTE — Patient Instructions (Signed)

## 2015-08-24 ENCOUNTER — Ambulatory Visit (INDEPENDENT_AMBULATORY_CARE_PROVIDER_SITE_OTHER): Payer: 59 | Admitting: Pediatrics

## 2015-08-24 ENCOUNTER — Encounter: Payer: Self-pay | Admitting: Pediatrics

## 2015-08-24 VITALS — BP 123/84 | HR 84 | Temp 97.4°F | Ht 66.0 in | Wt 210.0 lb

## 2015-08-24 DIAGNOSIS — E785 Hyperlipidemia, unspecified: Secondary | ICD-10-CM

## 2015-08-24 DIAGNOSIS — Z6833 Body mass index (BMI) 33.0-33.9, adult: Secondary | ICD-10-CM | POA: Insufficient documentation

## 2015-08-24 DIAGNOSIS — Z720 Tobacco use: Secondary | ICD-10-CM

## 2015-08-24 DIAGNOSIS — N898 Other specified noninflammatory disorders of vagina: Secondary | ICD-10-CM | POA: Diagnosis not present

## 2015-08-24 DIAGNOSIS — E538 Deficiency of other specified B group vitamins: Secondary | ICD-10-CM | POA: Diagnosis not present

## 2015-08-24 DIAGNOSIS — M79672 Pain in left foot: Secondary | ICD-10-CM | POA: Diagnosis not present

## 2015-08-24 DIAGNOSIS — M79671 Pain in right foot: Secondary | ICD-10-CM

## 2015-08-24 LAB — URINALYSIS, COMPLETE
Bilirubin, UA: NEGATIVE
Glucose, UA: NEGATIVE
Ketones, UA: NEGATIVE
LEUKOCYTES UA: NEGATIVE
NITRITE UA: NEGATIVE
PH UA: 7 (ref 5.0–7.5)
Protein, UA: NEGATIVE
RBC UA: NEGATIVE
Specific Gravity, UA: 1.015 (ref 1.005–1.030)
Urobilinogen, Ur: 0.2 mg/dL (ref 0.2–1.0)

## 2015-08-24 LAB — WET PREP FOR TRICH, YEAST, CLUE
CLUE CELL EXAM: NEGATIVE
Trichomonas Exam: NEGATIVE
YEAST EXAM: NEGATIVE

## 2015-08-24 LAB — MICROSCOPIC EXAMINATION: RBC MICROSCOPIC, UA: NONE SEEN /HPF (ref 0–?)

## 2015-08-24 MED ORDER — CYANOCOBALAMIN 1000 MCG/ML IJ SOLN
1000.0000 ug | Freq: Once | INTRAMUSCULAR | Status: AC
  Administered 2015-08-24: 1000 ug via INTRAMUSCULAR

## 2015-08-24 NOTE — Progress Notes (Signed)
    Subjective:    Patient ID: Kathy Ewing, female    DOB: 12-28-1970, 45 y.o.   MRN: [id number]  CC: Left foot pain and Bacteria infection?   HPI: Kathy Ewing is a 45 y.o. female presenting for Left foot pain and Bacteria infection?  Has some vaginal discharge, ahs a smell Sometimes yellow, sometimes clear Definitely different than normal discharge, ongoing for the past week Every time period comes on starts to get symptoms Period ended last week Had a pelvic infection years ago, no other STIs Only partner husband for years  Gained 25lbs over past year Started about time of having gallbladder out Is decreasing soda intake Interested in weight loss meidcation Eating 2-3 meals a day Minimal snacking Eating fruit usually when she does snack  Foot pain ongoing, present in heels b/l, L>R Had injections done in heels, minimal improvement in pain  Depression screen [facility] 2/9 08/24/2015 07/28/2015 06/26/2015 01/14/2015 10/29/2014  Decreased Interest 0 0 0 0 1  Down, Depressed, Hopeless 0 0 0 0 0  PHQ - 2 Score 0 0 0 0 1     Relevant past medical, surgical, family and social history reviewed and updated. Interim medical history since our last visit reviewed. Allergies and medications reviewed and updated.  History  Smoking Status  . Current Every Day Smoker  . Packs/day: 0.50  . Years: 30.00  . Types: Cigarettes  Smokeless Tobacco  . Never Used    ROS: Per HPI      Objective:    BP 123/84 (BP Location: Right Arm, Patient Position: Sitting, Cuff Size: Large)   Pulse 84   Temp 97.4 F (36.3 C) (Oral)   Ht 5\' 6"  (1.676 m)   Wt 210 lb (95.3 kg)   LMP 07/16/2015   BMI 33.89 kg/m   Wt Readings from Last 3 Encounters:  08/24/15 210 lb (95.3 kg)  07/28/15 212 lb (96.2 kg)  06/26/15 210 lb (95.3 kg)    Gen: NAD, alert, cooperative with exam, NCAT EYES: EOMI, no scleral injection or icterus CV: NRRR, normal S1/S2, no murmur, distal pulses 2+ b/l Resp: CTABL, no  wheezes, normal WOB Abd: +BS, soft, NTND. no guarding or organomegaly Ext: No edema, warm Neuro: Alert and oriented MSK: normal appearing heels, minimal arch flattening when standing, high arch present when not weight bearing     Assessment & Plan:    [name] was seen today for left foot pain and bacteria infection?.  Diagnoses and all orders for this visit:  Vaginal discharge -     Urinalysis, Complete -     WET PREP FOR [place], YEAST, CLUE  Vitamin B 12 deficiency -     cyanocobalamin ((VITAMIN B-12)) injection 1,000 mcg; Inject 1 mL (1,000 mcg total) into the muscle once.  BMI 33.0-33.9,adult Discussed lifestyle changes Cutting out sweetened beverages Pt interested in more help with weight loss, nutrition Will have her see Tammy  Tobacco abuse Continue to encourage cessation  Hyperlipidemia On atorvastatin, well controlled  Other orders -     Microscopic Examination  Heel pain No improvement with steroid injections Has been wearing heel cushions, arch support in steel toe work boots Continue good arch supports Will refer to podiatry for possible orthodics   Follow up plan: Return in about 1 week (around 08/31/2015) for pap smear/CPE with Dr. Evette Doffing, Tammy nutrition/weight loss appt same day.  [name], MD [facility] 08/24/2015, 3:25 PM

## 2015-09-01 ENCOUNTER — Encounter: Payer: Self-pay | Admitting: Pharmacist

## 2015-09-01 ENCOUNTER — Ambulatory Visit (INDEPENDENT_AMBULATORY_CARE_PROVIDER_SITE_OTHER): Payer: 59 | Admitting: Pharmacist

## 2015-09-01 VITALS — BP 122/82 | HR 78 | Wt 212.0 lb

## 2015-09-01 DIAGNOSIS — Z6833 Body mass index (BMI) 33.0-33.9, adult: Secondary | ICD-10-CM | POA: Diagnosis not present

## 2015-09-01 DIAGNOSIS — E785 Hyperlipidemia, unspecified: Secondary | ICD-10-CM

## 2015-09-01 MED ORDER — PHENTERMINE-TOPIRAMATE ER 3.75-23 MG PO CP24: ORAL_CAPSULE | ORAL | 0 refills | Status: DC

## 2015-09-01 NOTE — Progress Notes (Signed)
Patient ID: Kathy Ewing, female   DOB: 1970/10/24, 45 y.o.   MRN: [id number]  Subjective:     Kathy Ewing is a 45 y.o. female who I am asked to see in consultation for evaluation and treatment of obesity. Patient cites increased physical ability, self-image as reasons for wanting to lose weight.  Obesity History Weight in late teens: 120 lbs. Period of greatest weight gain: 25 lbs during the last 12 months Lowest adult weight: 125 Highest adult weight: 212   History of Weight Loss Efforts Successful weight loss techniques attempted: none Unsuccessful weight loss techniques attempted: self-directed dieting and has been limiting fat and fried foods over the last 3 months due to hypoerlipidemia but weight continued to increase  Current Exercise Habits none and is limited by current acute plantar fascitis and heel spurs  Current Eating Habits Number of regular meals per day: 2 Number of snacking episodes per day: 2 Who shops for food? patient and husband Who prepares food? patient and husband Who eats with patient? patient and husband Binge behavior?: no Purge behavior? no Anorexic behavior? no Eating precipitated by stress? yes -   Guilt feelings associated with eating? yes -    Other Potential Contributing Factors Use of alcohol: average 0 drinks/week Use of medications that may cause weight gain none Comorbidities: dyslipidemias and GERD The following portions of the patient's history were reviewed and updated as appropriate: allergies, current medications, past family history, past medical history, past social history and problem list.  Review of Systems Neurological: positive for migraine headaches - about 2 per week.  States she has tried several treatments that have never work weill    Objective:    BP 122/82   Pulse 78   Wt 212 lb (96.2 kg)   BMI 34.22 kg/m  Body mass index is 34.22 kg/m.   Assessment:    Obesity with BMI and comorbidities as noted  above. Signs of hypothyroidism: none Signs of hypercortisolism: none Contraindications to weight loss: none Patient readiness to commit to diet and activity changes: good Barriers to weight loss: stress ( ) and current physical limitations to exercise     Plan:    1. Diagnostic studies to rule out secondary causes of obesity: none 2. General patient education ('Yes' if discussed, 'No' if not) Importance of long-term maintenance tx in weight loss: yes Use non-food self-rewards to reinforce behavior changes: yes Elicit support from others; identify saboteurs: yes Initial target weight loss if 20# (patient would like weight to decrease to 160#) 3. Diet interventions: diet diary indefinitely, moderate (500 kCal/d) deficit diet and qualitative changes (increase low-fat,  high-fiber foods) Proper food choices reviewed: yes Preparation techniques reviewed: yes Careful meal planning; avoiding ad hoc eating: yes Stimulus control to control unhealthy eating: yes Handouts given: food list with recommended choices.    4. Exercise intervention:  Informal measures, e.g. taking stairs instead of elevator: yes Formal exercise regimen: yes - recommended chair exercises with weight and also resistance bands.  Handout given with sample exericses 5. . Other treatment: Medication: qsymia (phentermine + topiramate) 3.75/23mg  take 1 cap qam for 2 weeks then increase to 2 capsules qam (or 7.25/46mg ) 6. Patient to keep a food and exercise log or use My Fitness Pal app that we will review at follow up. 8. Follow up: 6 weeks and as needed.

## 2015-09-02 ENCOUNTER — Telehealth: Payer: Self-pay | Admitting: Family Medicine

## 2015-09-02 NOTE — Telephone Encounter (Signed)
Spoke with [employer].  qsymia needs PA.  [phone]; MRx Comm ID# [id number];  Group: [id number] Patient was already given card to get first 14 days for free.  She will use this and in meantime will send to our PA dept to work on.  If PA denied then can try phentermine and topirmate separately.

## 2015-09-02 NOTE — Telephone Encounter (Signed)
cal

## 2015-09-03 ENCOUNTER — Encounter: Payer: Self-pay | Admitting: Pediatrics

## 2015-09-03 ENCOUNTER — Ambulatory Visit (INDEPENDENT_AMBULATORY_CARE_PROVIDER_SITE_OTHER): Payer: 59 | Admitting: Pediatrics

## 2015-09-03 VITALS — BP 118/77 | HR 92 | Temp 97.4°F | Ht 66.0 in | Wt 210.6 lb

## 2015-09-03 DIAGNOSIS — W57XXXA Bitten or stung by nonvenomous insect and other nonvenomous arthropods, initial encounter: Secondary | ICD-10-CM | POA: Diagnosis not present

## 2015-09-03 DIAGNOSIS — N898 Other specified noninflammatory disorders of vagina: Secondary | ICD-10-CM

## 2015-09-03 DIAGNOSIS — Z124 Encounter for screening for malignant neoplasm of cervix: Secondary | ICD-10-CM

## 2015-09-03 DIAGNOSIS — S2090XA Unspecified superficial injury of unspecified parts of thorax, initial encounter: Secondary | ICD-10-CM | POA: Diagnosis not present

## 2015-09-03 DIAGNOSIS — M79672 Pain in left foot: Secondary | ICD-10-CM | POA: Diagnosis not present

## 2015-09-03 LAB — WET PREP FOR TRICH, YEAST, CLUE
CLUE CELL EXAM: NEGATIVE
TRICHOMONAS EXAM: NEGATIVE
Yeast Exam: NEGATIVE

## 2015-09-03 NOTE — Patient Instructions (Addendum)
Tylenol or benadryl for bug bite

## 2015-09-03 NOTE — Telephone Encounter (Signed)
In process on Cover My meds

## 2015-09-03 NOTE — Progress Notes (Signed)
    Subjective:    Patient ID: Kathy Ewing, female    DOB: [birth year], 45 y.o.   MRN: [id number]  CC: pap smear, heel pain  HPI: Kathy Ewing is a 45 y.o. female presenting for Abnormal Pap Smear  Has been having heel pain Worse after standing Has been wearing heel cups in steel toed shoes a work to help Hurts all over heel Primarily L heel Worse after standing Feels achey, burning She has tried plantar fasciitis stretching exercises Received steroid injections Went through steroid taper Neither one seemed to help with the pain  Continues to have vaginal discharge White-yellow Sometimes an odor Tends to be worse right after period Intercourse sometimes painful Sexually active only with husband Pt not worried about him having other partners  Last pap smear was a while ago, more than 3 years No h/o abnormals Had pelvic inflammatory as a teenager  Bug bite:  Relevant past medical, surgical, family and social history reviewed. Interim medical history since our last visit reviewed. Allergies and medications reviewed and updated.  History  Smoking Status  . Current Every Day Smoker  . Packs/day: 0.50  . Years: 30.00  . Types: Cigarettes  Smokeless Tobacco  . Never Used    ROS: Per HPI      Objective:    BP 118/77   Pulse 92   Temp 97.4 F (36.3 C) (Oral)   Ht 5\' 6"  (1.676 m)   Wt 210 lb 9.6 oz (95.5 kg)   BMI 33.99 kg/m   Wt Readings from Last 3 Encounters:  09/03/15 210 lb 9.6 oz (95.5 kg)  09/01/15 212 lb (96.2 kg)  08/24/15 210 lb (95.3 kg)     Gen: NAD, alert, cooperative with exam, NCAT EYES: EOMI, no conjunctival injection, or no icterus CV: NRRR, normal S1/S2, no murmur, distal pulses 2+ b/l Resp: CTABL, no wheezes, normal WOB Abd: +BS, soft, NTND. Ext: No edema, warm Neuro: Alert and oriented, strength equal b/l UE and LE, coordination grossly normal MSK: normal muscle bulk GU: normal appearing ext female genitalia, small amount  white discharge, thick Skin: L upper chest with approx 10cm area of soft swelling, slightly red, poorly demarcated     Assessment & Plan:  Altair was seen today for pap smear.  Diagnoses and all orders for this visit:  Screening for cervical cancer No h/o abnormals -     Pap IG and HPV (high risk) DNA detection  Heel pain, left Has tried steroid taper Steroid injections Plantar fascitis exercises -     Ambulatory referral to Podiatry  Vaginal discharge -     WET PREP FOR [place], YEAST, CLUE -     GC/Chlamydia Probe Amp  Bug bite Benadryl, tylenol No cellulitis now No fevers Let me know if worsening   Follow up plan: No Follow-up on file.  [name], MD [facility] 09/03/2015, 8:39 AM

## 2015-09-04 LAB — GC/CHLAMYDIA PROBE AMP
Chlamydia trachomatis, NAA: NEGATIVE
NEISSERIA GONORRHOEAE BY PCR: NEGATIVE

## 2015-09-07 LAB — PAP IG AND HPV HIGH-RISK
HPV, HIGH-RISK: NEGATIVE
PAP Smear Comment: 0

## 2015-09-08 MED ORDER — PHENTERMINE-TOPIRAMATE ER 7.5-46 MG PO CP24: 1.0000 | ORAL_CAPSULE | Freq: Every day | ORAL | 0 refills | Status: DC

## 2015-09-08 NOTE — Telephone Encounter (Signed)
It appears her insurance did not like the 3.75/23mg  take 2 capsules daily.  Rx changed to 7.5mg /46mg  1 capsule daily to see if ins will cover.  Patient aware.

## 2015-09-14 ENCOUNTER — Telehealth: Payer: Self-pay

## 2015-09-15 NOTE — Telephone Encounter (Signed)
FYI re the weight loss drug for this pt. Thank you for seeing her

## 2015-09-22 ENCOUNTER — Other Ambulatory Visit: Payer: Self-pay | Admitting: Pharmacist

## 2015-09-22 MED ORDER — TOPIRAMATE 25 MG PO TABS: 25.0000 mg | ORAL_TABLET | ORAL | 0 refills | Status: DC

## 2015-09-22 MED ORDER — PHENTERMINE HCL 37.5 MG PO TABS: 18.5000 mg | ORAL_TABLET | Freq: Every day | ORAL | 0 refills | Status: DC

## 2015-09-22 NOTE — Telephone Encounter (Signed)
Qysemia not covered by insurance and patient's does not have insurance currently because husband changed jobs.  Will try phentermin 37.5mg  1/2 tablet daily and topirimate 25mg  1 tablet daily.  Rx called in for 1 month.

## 2015-10-13 ENCOUNTER — Ambulatory Visit: Payer: Self-pay | Admitting: Pharmacist

## 2015-10-17 ENCOUNTER — Other Ambulatory Visit: Payer: Self-pay | Admitting: Family Medicine

## 2015-10-28 ENCOUNTER — Telehealth: Payer: Self-pay | Admitting: Family Medicine

## 2015-10-28 MED ORDER — TOPIRAMATE 50 MG PO TABS: 50.0000 mg | ORAL_TABLET | Freq: Every day | ORAL | 0 refills | Status: DC

## 2015-10-28 MED ORDER — PHENTERMINE HCL 37.5 MG PO TABS: 18.5000 mg | ORAL_TABLET | Freq: Every day | ORAL | 0 refills | Status: DC

## 2015-10-28 NOTE — Telephone Encounter (Signed)
Patient has done well with phenterimine / topiramate combo.  She has lost about 12 lbs.  She is due a visit but her husband has recently changed jobs and insurance will not be in effect for another 2 to 4 weeks. She reports that topiramite has not only help with appetite but her HA's have improved from 2 per day to 2-3 per week.  Will continue phentermine for 1 month more and increase topiramate to 50mg  daily to see if can decrease HA frequancy more.  Appt 11/12/2015

## 2015-11-12 ENCOUNTER — Ambulatory Visit: Payer: Self-pay | Admitting: Pharmacist

## 2015-11-19 ENCOUNTER — Encounter: Payer: Self-pay | Admitting: Pharmacist

## 2015-11-19 ENCOUNTER — Ambulatory Visit (INDEPENDENT_AMBULATORY_CARE_PROVIDER_SITE_OTHER): Payer: PRIVATE HEALTH INSURANCE | Admitting: Pharmacist

## 2015-11-19 ENCOUNTER — Ambulatory Visit (INDEPENDENT_AMBULATORY_CARE_PROVIDER_SITE_OTHER): Payer: PRIVATE HEALTH INSURANCE | Admitting: *Deleted

## 2015-11-19 DIAGNOSIS — E538 Deficiency of other specified B group vitamins: Secondary | ICD-10-CM

## 2015-11-19 DIAGNOSIS — E6609 Other obesity due to excess calories: Secondary | ICD-10-CM

## 2015-11-19 DIAGNOSIS — Z6831 Body mass index (BMI) 31.0-31.9, adult: Secondary | ICD-10-CM

## 2015-11-19 MED ORDER — CYANOCOBALAMIN 1000 MCG/ML IJ SOLN
1000.0000 ug | Freq: Once | INTRAMUSCULAR | Status: AC
  Administered 2015-11-19: 1000 ug via INTRAMUSCULAR

## 2015-11-19 MED ORDER — PHENTERMINE HCL 37.5 MG PO TABS: 37.5000 mg | ORAL_TABLET | Freq: Every day | ORAL | 0 refills | Status: DC

## 2015-11-19 NOTE — Progress Notes (Signed)
Patient ID: Kathy Ewing, female   DOB: [birth year], 45 y.o.   MRN: [id number]  Subjective:     Kathy Ewing is a 45 y.o. female who I am asked to see in consultation for evaluation and treatment of obesity. Patient cites increased physical ability, self-image as reasons for wanting to lose weight. She has been taking  phentermine 37.51m 1/2 tablet qam and topiramate 527mqd for the last 2-3 months.  Had tried to get Qsymia approved but denied by her insurance.  Her husband had changed jobs and for awhile she did not have any insurance but she has coverage which started this week.  Obesity History Weight in late teens: 120 lbs. Period of greatest weight gain: 25 lbs during the last 12 months Lowest adult weight: 125 Highest adult weight: 212   History of Weight Loss Efforts Successful weight loss techniques attempted: none Unsuccessful weight loss techniques attempted: self-directed dieting and has been limiting fat and fried foods over the last 3 months due to hyperlipidemia but weight continued to increase  Current Exercise Habits none - but she has plans to start using stationary bike since plantar fascitits and heel spur pain is improved  Current Eating Habits Number of regular meals per day: 3 Number of snacking episodes per day: 2 Who shops for food? patient and husband Who prepares food? patient and husband Who eats with patient? patient and husband Binge behavior?: no Purge behavior? no Anorexic behavior? no Eating precipitated by stress? yes -   Guilt feelings associated with eating? yes -    Other Potential Contributing Factors Use of alcohol: average 0 drinks/week Use of medications that may cause weight gain none Comorbidities: dyslipidemias and GERD The following portions of the patient's history were reviewed and updated as appropriate: allergies, current medications and problem list.  Review of Systems Neurological: positive for migraine headaches - about 2  per week.  States she has tried several treatments that have never work weill She reports that since taking topiramate her HA have decreases which she is surprised about because she had taken topiramate in past for HAs.   Objective:    BP 122/66   Pulse 86   Ht 5' 6"  (1.676 m)   Wt 196 lb (88.9 kg)   BMI 31.64 kg/m  Body mass index is 31.64 kg/m.   Weight has decrease by 15# over the last 3 months BMI has improved from 34.1 to 31.64   Assessment:    Obesity with BMI and comorbidities as noted above - great response to current treatment, dietary changes Hypocalcemia   Plan:    1. Diagnostic studies to rule out secondary causes of obesity: none 2. General patient education ('Yes' if discussed, 'No' if not) Use non-food self-rewards to reinforce behavior changes: yes Elicit support from others; identify saboteurs: yes - her family has been very supportive Initial target weight loss if 20# (patient would like weight to decrease to 160#) 3. Diet interventions: diet diary indefinitely, moderate (500 kCal/d) deficit diet and qualitative changes (increase low-fat,  high-fiber foods) Proper food choices reviewed: yes Preparation techniques reviewed: yes Careful meal planning; avoiding ad hoc eating: yes 4. Exercise intervention:  Informal measures, e.g. taking stairs instead of elevator: yes Formal exercise regimen: yes - patient to start using stationary bike for exercise - goal is 150 minutes per week 5. Continue topiramate 5097md; increase phentermine 37.5mg32m 1 tablet daily 6.  Appt made for mammogram 7. Follow up: weight check in 4  weeks, will see me in January 2018 and PCP in Feb 2018  Orders Placed This Encounter  Procedures  . BMP8+EGFR

## 2015-11-19 NOTE — Progress Notes (Signed)
Pt given B12 inj Tolerated well 

## 2015-11-19 NOTE — Patient Instructions (Addendum)
Great job with diet changes! You have lost 15 lbs since August 2017.   Try to add exercise - goal is 150 minutes per week.

## 2015-11-20 LAB — BMP8+EGFR
BUN / CREAT RATIO: 10 (ref 9–23)
BUN: 9 mg/dL (ref 6–24)
CO2: 19 mmol/L (ref 18–29)
CREATININE: 0.93 mg/dL (ref 0.57–1.00)
Calcium: 9.5 mg/dL (ref 8.7–10.2)
Chloride: 106 mmol/L (ref 96–106)
GFR calc Af Amer: 87 mL/min/{1.73_m2} (ref >59)
GFR calc non Af Amer: 76 mL/min/{1.73_m2} (ref >59)
GLUCOSE: 80 mg/dL (ref 65–99)
Potassium: 4.5 mmol/L (ref 3.5–5.2)
SODIUM: 140 mmol/L (ref 134–144)

## 2015-11-23 ENCOUNTER — Ambulatory Visit (INDEPENDENT_AMBULATORY_CARE_PROVIDER_SITE_OTHER): Payer: PRIVATE HEALTH INSURANCE | Admitting: Family Medicine

## 2015-11-23 ENCOUNTER — Encounter: Payer: Self-pay | Admitting: Family Medicine

## 2015-11-23 VITALS — BP 109/79 | HR 87 | Temp 97.0°F | Ht 66.0 in | Wt 196.6 lb

## 2015-11-23 DIAGNOSIS — G43709 Chronic migraine without aura, not intractable, without status migrainosus: Secondary | ICD-10-CM

## 2015-11-23 DIAGNOSIS — E538 Deficiency of other specified B group vitamins: Secondary | ICD-10-CM | POA: Diagnosis not present

## 2015-11-23 DIAGNOSIS — Z6833 Body mass index (BMI) 33.0-33.9, adult: Secondary | ICD-10-CM | POA: Diagnosis not present

## 2015-11-23 DIAGNOSIS — F419 Anxiety disorder, unspecified: Secondary | ICD-10-CM

## 2015-11-23 MED ORDER — ALPRAZOLAM 0.5 MG PO TABS: ORAL_TABLET | ORAL | 2 refills | Status: DC

## 2015-11-23 MED ORDER — PHENTERMINE HCL 37.5 MG PO TABS: 37.5000 mg | ORAL_TABLET | Freq: Every day | ORAL | 1 refills | Status: DC

## 2015-11-23 NOTE — Progress Notes (Signed)
Subjective:  Patient ID: Kathy Ewing, female    DOB: [birth year]  Age: 45 y.o. MRN: [id number]  CC: Medication Refill   HPI Kathy Ewing presents for depression & anxiety  GAD 7 : Generalized Anxiety Score 03/31/2015 01/14/2015  Nervous, Anxious, on Edge 2 3  Control/stop worrying 3 2  Worry too much - different things 3 2  Trouble relaxing 2 1  Restless 1 0  Easily annoyed or irritable 2 0  Afraid - awful might happen 3 0  Total GAD 7 Score 16 8  Anxiety Difficulty Very difficult Not difficult at all    Very nervous, anxious. Daughter made suicide attempt 3 mos ago. Oldest daughter died 9 years ago.  Worrying less. Occasionally restless, occasionally irritable Alprazolam helps. Taking prn. 2-3 times aweek, more or less Topamax helping with HA. Didn't work in the past. History Kathy Ewing has a past medical history of Anxiety; B12 deficiency; Cholelithiasis; GERD (gastroesophageal reflux disease); Headache; High cholesterol; Numbness; Pneumonia; and Vitamin D deficiency.   She has a past surgical history that includes Cesarean section and Cholecystectomy (N/A, 10/02/2014).   Her family history includes Anxiety disorder in her brother; Cerebral palsy in her daughter; Depression in her daughter; Hypertension in her mother; Migraines in her sister; Multiple sclerosis in her sister.She reports that she has been smoking Cigarettes.  She has a 15.00 pack-year smoking history. She has never used smokeless tobacco. She reports that she does not drink alcohol or use drugs.    ROS Review of Systems  Constitutional: Negative for activity change, appetite change and fever.  HENT: Negative for congestion, rhinorrhea and sore throat.   Eyes: Negative for visual disturbance.  Respiratory: Negative for cough and shortness of breath.   Cardiovascular: Negative for chest pain and palpitations.  Gastrointestinal: Negative for abdominal pain, diarrhea and nausea.  Genitourinary: Negative for  dysuria.  Musculoskeletal: Negative for arthralgias and myalgias.  Psychiatric/Behavioral: Positive for agitation and dysphoric mood.    Objective:  BP 109/79   Pulse 87   Temp 97 F (36.1 C) (Oral)   Ht 5\' 6"  (1.676 m)   Wt 196 lb 9.6 oz (89.2 kg)   BMI 31.73 kg/m   BP Readings from Last 3 Encounters:  11/23/15 109/79  11/19/15 122/66  09/03/15 118/77    Wt Readings from Last 3 Encounters:  11/23/15 196 lb 9.6 oz (89.2 kg)  11/19/15 196 lb (88.9 kg)  09/03/15 210 lb 9.6 oz (95.5 kg)     Physical Exam  Constitutional: She is oriented to person, place, and time. She appears well-developed and well-nourished. No distress.  HENT:  Head: Normocephalic and atraumatic.  Right Ear: External ear normal.  Left Ear: External ear normal.  Nose: Nose normal.  Mouth/Throat: Oropharynx is clear and moist.  Eyes: Conjunctivae and EOM are normal. Pupils are equal, round, and reactive to light.  Neck: Normal range of motion. Neck supple. No thyromegaly present.  Cardiovascular: Normal rate, regular rhythm and normal heart sounds.   No murmur heard. Pulmonary/Chest: Effort normal and breath sounds normal. No respiratory distress. She has no wheezes. She has no rales.  Abdominal: Soft. Bowel sounds are normal. She exhibits no distension. There is no tenderness.  Lymphadenopathy:    She has no cervical adenopathy.  Neurological: She is alert and oriented to person, place, and time. She has normal reflexes.  Skin: Skin is warm and dry.  Psychiatric: Her behavior is normal. Judgment normal.     Lab Results  Component Value Date   WBC 5.8 04/04/2015   HGB 13.3 10/02/2014   HCT 40.5 04/04/2015   PLT 326 04/04/2015   GLUCOSE 80 11/19/2015   CHOL 128 04/04/2015   TRIG 79 04/04/2015   HDL 39 (L) 04/04/2015   LDLCALC 73 04/04/2015   ALT 19 04/04/2015   AST 23 04/04/2015   NA 140 11/19/2015   K 4.5 11/19/2015   CL 106 11/19/2015   CREATININE 0.93 11/19/2015   BUN 9 11/19/2015    CO2 19 11/19/2015   TSH 3.180 02/19/2014    Ct Abdomen Pelvis W Contrast  Result Date: 10/28/2014 CLINICAL DATA:  Lower abdominal pain, nausea. Prior cholecystectomy 10/02/2014. EXAM: CT ABDOMEN AND PELVIS WITH CONTRAST TECHNIQUE: Multidetector CT imaging of the abdomen and pelvis was performed using the standard protocol following bolus administration of intravenous contrast. CONTRAST:  1 OMNIPAQUE IOHEXOL 300 MG/ML SOLN, 14mL ISOVUE-300 IOPAMIDOL (ISOVUE-300) INJECTION 61% COMPARISON:  Ultrasound 09/15/2014 FINDINGS: Lung bases are clear.  No effusions.  Heart is normal size. Small cysts scattered throughout the liver which appear benign. Prior cholecystectomy. Spleen, pancreas, adrenals and kidneys are normal. Bowel grossly unremarkable. No free fluid, free air, or adenopathy. Appendix is visualized and is normal. Uterus, adnexae and urinary bladder normal. No acute bony abnormality or focal bone lesion. IMPRESSION: No acute findings in the abdomen or pelvis. No complicating feature from recent cholecystectomy. Scattered small benign-appearing hepatic cysts. Electronically Signed   By: Rolm Baptise M.D.   On: 10/28/2014 10:37    Assessment & Plan:   Kathy Ewing was seen today for medication refill.  Diagnoses and all orders for this visit:  Chronic migraine without aura without status migrainosus, not intractable  Vitamin B 12 deficiency  BMI 33.0-33.9,adult -     phentermine (ADIPEX-P) 37.5 MG tablet; Take 1 tablet (37.5 mg total) by mouth daily before breakfast.  Anxiety -     ALPRAZolam (XANAX) 0.5 MG tablet; TAKE 1 TO 2 TABLETS BY MOUTH TWICE DAILYAS NEEDED FOR ANXIETY    I am having Ms. Kathy Ewing maintain her omeprazole, atorvastatin, phentermine, and ALPRAZolam.  Meds ordered this encounter  Medications  . phentermine (ADIPEX-P) 37.5 MG tablet    Sig: Take 1 tablet (37.5 mg total) by mouth daily before breakfast.    Dispense:  30 tablet    Refill:  1    Do not fill before Dec 19, 2015  . ALPRAZolam (XANAX) 0.5 MG tablet    Sig: TAKE 1 TO 2 TABLETS BY MOUTH TWICE DAILYAS NEEDED FOR ANXIETY    Dispense:  60 tablet    Refill:  2     Follow-up: Return in about 3 months (around 02/23/2016).  Claretta Fraise, M.D.

## 2015-11-25 ENCOUNTER — Other Ambulatory Visit: Payer: Self-pay | Admitting: *Deleted

## 2015-11-25 MED ORDER — TOPIRAMATE 50 MG PO TABS: 50.0000 mg | ORAL_TABLET | Freq: Every day | ORAL | 1 refills | Status: DC

## 2015-12-21 ENCOUNTER — Encounter: Payer: Self-pay | Admitting: Nurse Practitioner

## 2015-12-21 ENCOUNTER — Ambulatory Visit: Payer: PRIVATE HEALTH INSURANCE

## 2015-12-21 ENCOUNTER — Ambulatory Visit (INDEPENDENT_AMBULATORY_CARE_PROVIDER_SITE_OTHER): Payer: PRIVATE HEALTH INSURANCE | Admitting: Nurse Practitioner

## 2015-12-21 VITALS — BP 121/85 | HR 84 | Temp 96.6°F | Ht 66.0 in | Wt 192.0 lb

## 2015-12-21 DIAGNOSIS — E538 Deficiency of other specified B group vitamins: Secondary | ICD-10-CM | POA: Diagnosis not present

## 2015-12-21 DIAGNOSIS — H8303 Labyrinthitis, bilateral: Secondary | ICD-10-CM | POA: Diagnosis not present

## 2015-12-21 DIAGNOSIS — E559 Vitamin D deficiency, unspecified: Secondary | ICD-10-CM | POA: Diagnosis not present

## 2015-12-21 MED ORDER — PREDNISONE 20 MG PO TABS: ORAL_TABLET | ORAL | 0 refills | Status: DC

## 2015-12-21 MED ORDER — MECLIZINE HCL 25 MG PO TABS: 25.0000 mg | ORAL_TABLET | Freq: Three times a day (TID) | ORAL | 0 refills | Status: DC | PRN

## 2015-12-21 MED ORDER — CYANOCOBALAMIN 1000 MCG/ML IJ SOLN
1000.0000 ug | Freq: Once | INTRAMUSCULAR | Status: AC
  Administered 2015-12-21: 1000 ug via INTRAMUSCULAR

## 2015-12-21 NOTE — Patient Instructions (Signed)
Labyrinthitis Introduction Labyrinthitis is an infection of the inner ear. Your inner ear is a fluid-filled system of tubes and canals (labyrinth). Nerve cells in your inner ear send signals for hearing and balance to your brain. When tiny germs (microorganisms) get inside the labyrinth, they harm the cells that send messages to the brain. Labyrinthitis can cause changes in hearing and balance. Most cases of labyrinthitis come on suddenly and they clear up within weeks. If the infection damages parts of the labyrinth, some symptoms may remain (chronic labyrinthitis). What are the causes? Viruses are the most common cause of labyrinthitis. Viruses that spread into the labyrinth are the same viruses that cause other diseases, such as:  Mononucleosis.  Measles.  Flu.  Herpes. Bacteria can also cause labyrinthitis when they spread into the labyrinth from an infection in the brain or the middle ear. Bacteria can cause:  Serous labyrinthitis. This type of labyrinthitis develops when bacteria produce a poison (toxin) that gets inside the labyrinth.  Suppurative labyrinthitis. This type of labyrinthitis develops when bacteria get inside the labyrinth. What increases the risk? You may be at greater risk for labyrinthitis if you:  Drink a lot of alcohol.  Smoke.  Take certain drugs.  Are not well rested (fatigued).  Are under a lot of stress.  Have allergies.  Recently had a nose or throat infection (upper respiratory infection) or an ear infection. What are the signs or symptoms? Symptoms of labyrinthitis usually start suddenly. The symptoms can be mild or strong and may include:  Dizziness.  Hearing loss.  A feeling that you are moving when you are not (vertigo).  Ringing in the ear (tinnitus).  Nausea and vomiting.  Trouble focusing your eyes. Symptoms of chronic labyrinthitis may include:  Fatigue.  Confusion.  Hearing loss.  Tinnitus.  Poor balance.  Vertigo  after sudden head movements. How is this diagnosed? Your health care provider may suspect labyrinthitis if you suddenly get dizzy and lose hearing, especially if you had a recent upper respiratory infection. Your health care provider will perform a physical exam to:  Check your ears for infection.  Test your balance.  Check your eye movement. Your health care provider may do several tests to rule out other causes of your symptoms and to help make a diagnosis of labyrinthitis. These may include:  Imaging studies, such as a CT scan or an MRI, to look for other causes of your symptoms.  Hearing tests.  Electronystagmography (ENG) to check your balance. How is this treated? Treatment of labyrinthitis depends on the cause. If your labyrinthitis is caused by a virus, it may get better without treatment. If your labyrinthitis is caused by bacteria, you may need medicine to fight the infection (antibiotic medicine). You may also have treatment to relieve labyrinthitis symptoms. Treatments may include:  Medicines to:  Stop dizziness.  Relieve nausea.  Treat the inflamed area.  Speed up your recovery.  Bed rest until dizziness goes away.  Fluids given through an IV tube. You may need this treatment if you have too little fluid in your body (dehydrated) from repeated nausea and vomiting. Follow these instructions at home:  Take medicines only as directed by your health care provider.  If you were prescribed an antibiotic medicine, finish all of it even if you start to feel better.  Rest as much as possible.  Avoid loud noises and bright lights.  Do not make sudden movements until any dizziness goes away.  Do not drive until your  health care provider says that you can.  Drink enough fluid to keep your urine clear or pale yellow.  Work with a physical therapist if you still feel dizzy after several weeks. A therapist can teach you exercises to help you adjust to feeling dizzy  (vestibular rehabilitation exercises).  Keep all follow-up visits as directed by your health care provider. This is important. Contact a health care provider if:  Your symptoms are not relieved by medicines.  Your symptoms last longer than two weeks.  You have a fever. Get help right away if:  You become very dizzy.  You have nausea or vomiting that does not go away.  Your hearing gets much worse very quickly. This information is not intended to replace advice given to you by your health care provider. Make sure you discuss any questions you have with your health care provider. Document Released: 01/24/2014 Document Revised: 06/11/2015 Document Reviewed: 09/04/2013  2017 [employer]

## 2015-12-21 NOTE — Addendum Note (Signed)
Addended by: Rolena Infante on: 12/21/2015 11:54 AM   Modules accepted: Orders

## 2015-12-21 NOTE — Progress Notes (Signed)
   Subjective:    Patient ID: Kathy Ewing, female    DOB: [birth year], 45 y.o.   MRN: [id number]  HPI Patient comes in today C/O dizziness- it started 2 weeks ago and has gradually gotten worse- If she turns her head to quickly she looses her balance. Rolling over in bed makes her dizzy.    Review of Systems  Constitutional: Negative.   HENT: Negative.   Eyes: Negative.   Respiratory: Negative for shortness of breath.   Cardiovascular: Negative.   Gastrointestinal: Negative.   Genitourinary: Negative.   Musculoskeletal: Negative.   Neurological: Positive for dizziness.  Psychiatric/Behavioral: Negative.   All other systems reviewed and are negative.      Objective:   Physical Exam  Constitutional: She is oriented to person, place, and time. She appears well-developed and well-nourished. No distress.  HENT:  Right Ear: External ear normal.  Left Ear: External ear normal.  Nose: Nose normal.  Mouth/Throat: Oropharynx is clear and moist.  Mild maxillary sinus pressure  Cardiovascular: Normal rate, regular rhythm and normal heart sounds.   Pulmonary/Chest: Effort normal and breath sounds normal.  Neurological: She is alert and oriented to person, place, and time. She has normal reflexes. No cranial nerve deficit.  Skin: Skin is warm.  Psychiatric: She has a normal mood and affect. Her behavior is normal. Judgment and thought content normal.   BP 121/85   Pulse 84   Temp (!) 96.6 F (35.9 C) (Oral)   Ht 5\' 6"  (1.676 m)   Wt 192 lb (87.1 kg)   BMI 30.99 kg/m         Assessment & Plan:  1. Labyrinthitis of both ears Force fluids OTC decongestant b12 injection today - meclizine (ANTIVERT) 25 MG tablet; Take 1 tablet (25 mg total) by mouth 3 (three) times daily as needed for dizziness.  Dispense: 30 tablet; Refill: 0 - predniSONE (DELTASONE) 20 MG tablet; 2 po at sametime daily for 5 days  Dispense: 10 tablet; Refill: 0  Mary-Margaret Hassell Done, FNP

## 2015-12-28 ENCOUNTER — Other Ambulatory Visit: Payer: Self-pay | Admitting: Family Medicine

## 2016-01-04 ENCOUNTER — Ambulatory Visit (INDEPENDENT_AMBULATORY_CARE_PROVIDER_SITE_OTHER): Payer: PRIVATE HEALTH INSURANCE | Admitting: Family Medicine

## 2016-01-04 ENCOUNTER — Other Ambulatory Visit: Payer: Self-pay | Admitting: Family Medicine

## 2016-01-04 ENCOUNTER — Encounter: Payer: Self-pay | Admitting: Family Medicine

## 2016-01-04 VITALS — BP 114/74 | HR 89 | Temp 97.2°F | Ht 66.0 in | Wt 195.0 lb

## 2016-01-04 DIAGNOSIS — R42 Dizziness and giddiness: Secondary | ICD-10-CM | POA: Diagnosis not present

## 2016-01-04 DIAGNOSIS — G43709 Chronic migraine without aura, not intractable, without status migrainosus: Secondary | ICD-10-CM

## 2016-01-04 DIAGNOSIS — F419 Anxiety disorder, unspecified: Secondary | ICD-10-CM | POA: Diagnosis not present

## 2016-01-04 MED ORDER — FROVATRIPTAN SUCCINATE 2.5 MG PO TABS: 2.5000 mg | ORAL_TABLET | ORAL | 0 refills | Status: DC | PRN

## 2016-01-04 NOTE — Progress Notes (Addendum)
Subjective:  Patient ID: Kathy Ewing, female    DOB: 1970/03/24  Age: 45 y.o. MRN: [id number]  CC: Dizziness (pt here today c/o dizziness and migraines)   HPI Kathy Ewing presents for States dizziness is not improved. She feels off balance. She feels like the room is moving at times. She feels that she could pass out at any time. The dizziness comes every day. It can be there all day or be intermittent through the day. She has had severe migraines also during this last week. She describes them as 9/10 right hemicranial throbbing.Onset was approximately mid November. Of note is that the patient had been prescribed topiramate plus phentermine 2 weeks prior. She had not had symptoms similar to this in the past. She has had migraines. They have responded to Frova in the past.   History Kathy Ewing has a past medical history of Anxiety; B12 deficiency; Cholelithiasis; GERD (gastroesophageal reflux disease); Headache; High cholesterol; Numbness; Pneumonia; and Vitamin D deficiency.   She has a past surgical history that includes Cesarean section and Cholecystectomy (N/A, 10/02/2014).   Her family history includes Anxiety disorder in her brother; Cerebral palsy in her daughter; Depression in her daughter; Hypertension in her mother; Migraines in her sister; Multiple sclerosis in her sister.She reports that she has been smoking Cigarettes.  She has a 15.00 pack-year smoking history. She has never used smokeless tobacco. She reports that she does not drink alcohol or use drugs.    ROS Review of Systems  Constitutional: Negative for activity change, appetite change and fever.  HENT: Negative for congestion, rhinorrhea and sore throat.   Eyes: Negative for visual disturbance.  Respiratory: Negative for cough and shortness of breath.   Cardiovascular: Negative for chest pain and palpitations.  Gastrointestinal: Negative for abdominal pain, diarrhea and nausea.  Genitourinary: Negative for dysuria.   Musculoskeletal: Negative for arthralgias and myalgias.  Neurological: Positive for dizziness, light-headedness and headaches. Negative for syncope and weakness.  Psychiatric/Behavioral: The patient is nervous/anxious.     Objective:  BP 114/74   Pulse 89   Temp 97.2 F (36.2 C) (Oral)   Ht 5' 6"  (1.676 m)   Wt 195 lb (88.5 kg)   LMP 12/21/2015 (Approximate)   BMI 31.47 kg/m   BP Readings from Last 3 Encounters:  01/04/16 114/74  12/21/15 121/85  11/23/15 109/79    Wt Readings from Last 3 Encounters:  01/04/16 195 lb (88.5 kg)  12/21/15 192 lb (87.1 kg)  11/23/15 196 lb 9.6 oz (89.2 kg)     Physical Exam  Constitutional: She is oriented to person, place, and time. She appears well-developed and well-nourished. She appears distressed.  HENT:  Head: Normocephalic and atraumatic.  Right Ear: External ear normal.  Left Ear: External ear normal.  Nose: Nose normal.  Mouth/Throat: Oropharynx is clear and moist.  Eyes: Conjunctivae and EOM are normal. Pupils are equal, round, and reactive to light.  Neck: Normal range of motion. Neck supple. No thyromegaly present.  Cardiovascular: Normal rate, regular rhythm and normal heart sounds.   No murmur heard. Pulmonary/Chest: Effort normal and breath sounds normal. No respiratory distress. She has no wheezes. She has no rales.  Abdominal: Soft. Bowel sounds are normal. She exhibits no distension. There is no tenderness.  Lymphadenopathy:    She has no cervical adenopathy.  Neurological: She is alert and oriented to person, place, and time. She has normal reflexes.  Skin: Skin is warm and dry.  Psychiatric: She has a normal  mood and affect. Her behavior is normal. Judgment and thought content normal.     Lab Results  Component Value Date   WBC 5.8 04/04/2015   HGB 13.3 10/02/2014   HCT 40.5 04/04/2015   PLT 326 04/04/2015   GLUCOSE 80 11/19/2015   CHOL 128 04/04/2015   TRIG 79 04/04/2015   HDL 39 (L) 04/04/2015    LDLCALC 73 04/04/2015   ALT 19 04/04/2015   AST 23 04/04/2015   NA 140 11/19/2015   K 4.5 11/19/2015   CL 106 11/19/2015   CREATININE 0.93 11/19/2015   BUN 9 11/19/2015   CO2 19 11/19/2015   TSH 3.180 02/19/2014    Ct Abdomen Pelvis W Contrast  Result Date: 10/28/2014 CLINICAL DATA:  Lower abdominal pain, nausea. Prior cholecystectomy 10/02/2014. EXAM: CT ABDOMEN AND PELVIS WITH CONTRAST TECHNIQUE: Multidetector CT imaging of the abdomen and pelvis was performed using the standard protocol following bolus administration of intravenous contrast. CONTRAST:  1 OMNIPAQUE IOHEXOL 300 MG/ML SOLN, 173m ISOVUE-300 IOPAMIDOL (ISOVUE-300) INJECTION 61% COMPARISON:  Ultrasound 09/15/2014 FINDINGS: Lung bases are clear.  No effusions.  Heart is normal size. Small cysts scattered throughout the liver which appear benign. Prior cholecystectomy. Spleen, pancreas, adrenals and kidneys are normal. Bowel grossly unremarkable. No free fluid, free air, or adenopathy. Appendix is visualized and is normal. Uterus, adnexae and urinary bladder normal. No acute bony abnormality or focal bone lesion. IMPRESSION: No acute findings in the abdomen or pelvis. No complicating feature from recent cholecystectomy. Scattered small benign-appearing hepatic cysts. Electronically Signed   By: KRolm BaptiseM.D.   On: 10/28/2014 10:37    Assessment & Plan:   MKaylisewas seen today for dizziness.  Diagnoses and all orders for this visit:  Chronic migraine without aura without status migrainosus, not intractable -     CBC with Differential/Platelet -     CMP14+EGFR -     TSH -     [facility]     Ambulatory referral to Neurology -     Sedimentation rate -     frovatriptan (FROVA) 2.5 MG tablet; Take 1 tablet (2.5 mg total) by mouth as needed for migraine. If recurs, may repeat after 2 hours. Max of 3 tabs in 24 hours.  Anxiety -     CBC with Differential/Platelet -     CMP14+EGFR -     TSH -      [facility]dimer, quantitative ([facility] -     Ambulatory referral to Neurology -     Sedimentation rate  . -     CBC with Differential/Platelet -     CMP14+EGFR -     TSH -     [facility], [facility]     Ambulatory referral to Neurology -     Sedimentation rate  Dizziness -     CBC with Differential/Platelet -     CMP14+EGFR -     TSH -     [facility] -     Ambulatory referral to Neurology -     Sedimentation rate   I have discontinued Ms. Forgione's phentermine, topiramate, and predniSONE. I am also having her maintain her omeprazole, ALPRAZolam, meclizine, atorvastatin, and frovatriptan.  Meds ordered this encounter  Medications  . frovatriptan (FROVA) 2.5 MG tablet    Sig: Take 1 tablet (2.5 mg total) by mouth as needed for migraine. If recurs, may repeat after 2 hours. Max of 3  tabs in 24 hours.    Dispense:  10 tablet    Refill:  0     Follow-up: Return in about 2 weeks (around 01/18/2016).  Claretta Fraise, M.D.

## 2016-01-05 LAB — CBC WITH DIFFERENTIAL/PLATELET
Basophils Absolute: 0.1 10*3/uL (ref 0.0–0.2)
Basos: 1 %
EOS (ABSOLUTE): 0.6 10*3/uL — ABNORMAL HIGH (ref 0.0–0.4)
EOS: 6 %
HEMATOCRIT: 39.6 % (ref 34.0–46.6)
HEMOGLOBIN: 12.8 g/dL (ref 11.1–15.9)
IMMATURE GRANULOCYTES: 1 %
Immature Grans (Abs): 0.1 10*3/uL (ref 0.0–0.1)
LYMPHS ABS: 2.6 10*3/uL (ref 0.7–3.1)
Lymphs: 26 %
MCH: 28.4 pg (ref 26.6–33.0)
MCHC: 32.3 g/dL (ref 31.5–35.7)
MCV: 88 fL (ref 79–97)
MONOCYTES: 9 %
Monocytes Absolute: 0.9 10*3/uL (ref 0.1–0.9)
Neutrophils Absolute: 6 10*3/uL (ref 1.4–7.0)
Neutrophils: 57 %
Platelets: 402 10*3/uL — ABNORMAL HIGH (ref 150–379)
RBC: 4.51 x10E6/uL (ref 3.77–5.28)
RDW: 14.3 % (ref 12.3–15.4)
WBC: 10.2 10*3/uL (ref 3.4–10.8)

## 2016-01-05 LAB — SEDIMENTATION RATE: SED RATE: 3 mm/h (ref 0–32)

## 2016-01-05 LAB — D-DIMER, QUANTITATIVE (NOT AT ARMC): D-DIMER: 0.37 mg{FEU}/L (ref 0.00–0.49)

## 2016-01-06 LAB — CMP14+EGFR
ALBUMIN: 3.8 g/dL (ref 3.5–5.5)
ALT: 16 IU/L (ref 0–32)
AST: 14 IU/L (ref 0–40)
Albumin/Globulin Ratio: 1.6 (ref 1.2–2.2)
Alkaline Phosphatase: 107 IU/L (ref 39–117)
BUN / CREAT RATIO: 10 (ref 9–23)
BUN: 9 mg/dL (ref 6–24)
Bilirubin Total: <0.2 mg/dL (ref 0.0–1.2)
CALCIUM: 8.6 mg/dL — AB (ref 8.7–10.2)
CO2: 20 mmol/L (ref 18–29)
CREATININE: 0.91 mg/dL (ref 0.57–1.00)
Chloride: 106 mmol/L (ref 96–106)
GFR, EST AFRICAN AMERICAN: 89 mL/min/{1.73_m2} (ref >59)
GFR, EST NON AFRICAN AMERICAN: 78 mL/min/{1.73_m2} (ref >59)
GLUCOSE: 82 mg/dL (ref 65–99)
Globulin, Total: 2.4 g/dL (ref 1.5–4.5)
Potassium: 4.4 mmol/L (ref 3.5–5.2)
Sodium: 142 mmol/L (ref 134–144)
TOTAL PROTEIN: 6.2 g/dL (ref 6.0–8.5)

## 2016-01-06 LAB — TSH: TSH: 4.22 u[IU]/mL (ref 0.450–4.500)

## 2016-01-08 ENCOUNTER — Telehealth: Payer: Self-pay

## 2016-01-08 ENCOUNTER — Other Ambulatory Visit: Payer: Self-pay | Admitting: Family Medicine

## 2016-01-08 MED ORDER — NARATRIPTAN HCL 2.5 MG PO TABS: 2.5000 mg | ORAL_TABLET | ORAL | 0 refills | Status: DC | PRN

## 2016-01-08 NOTE — Telephone Encounter (Signed)
Patient aware that medication has been sent to pharmacy 

## 2016-01-08 NOTE — Telephone Encounter (Signed)
I sent in the requested prescription 

## 2016-01-25 ENCOUNTER — Ambulatory Visit (INDEPENDENT_AMBULATORY_CARE_PROVIDER_SITE_OTHER): Payer: PRIVATE HEALTH INSURANCE | Admitting: Pharmacist

## 2016-01-25 VITALS — BP 128/78 | HR 82 | Ht 66.0 in | Wt 195.0 lb

## 2016-01-25 DIAGNOSIS — E66811 Obesity, class 1: Secondary | ICD-10-CM

## 2016-01-25 DIAGNOSIS — E538 Deficiency of other specified B group vitamins: Secondary | ICD-10-CM

## 2016-01-25 DIAGNOSIS — Z79899 Other long term (current) drug therapy: Secondary | ICD-10-CM

## 2016-01-25 DIAGNOSIS — Z6831 Body mass index (BMI) 31.0-31.9, adult: Secondary | ICD-10-CM

## 2016-01-25 DIAGNOSIS — E6609 Other obesity due to excess calories: Secondary | ICD-10-CM

## 2016-01-25 MED ORDER — CYANOCOBALAMIN 1000 MCG/ML IJ SOLN
1000.0000 ug | INTRAMUSCULAR | Status: AC
  Administered 2016-01-25 – 2020-05-25 (×18): 1000 ug via INTRAMUSCULAR

## 2016-01-25 NOTE — Progress Notes (Signed)
Patient ID: Kathy Ewing, female   DOB: 1970/12/18, 46 y.o.   MRN: [id number]    Subjective:     [name] is a 46 y.o. female.  She is here today for follow up obesity and diet counseling.  Patient cites increased physical ability, self-image as reasons for wanting to lose weight. She had been taking  phentermine 37.5mg  1/2 tablet qam and topiramate 50mg  qd for the last 4 months but both were stopped mid December when she was having migraine HA and dizziness.  She is scheduled to see neurologist 01/27/2016 for evaluation.  She reports that dizziness has not improved and that HA continues.  She has not been able to start triptan (Frova) that was called in because it was not covered by her insurance.  Obesity History Weight in late teens: 120 lbs. Period of greatest weight gain: 25 lbs during the last 12 months Lowest adult weight: 125 Highest adult weight: 212 Patient weight at start of weight loss counseling and start of medication on 09/04/2015 was 212# Weight today was 195#   History of Weight Loss Efforts Successful weight loss techniques attempted: none Unsuccessful weight loss techniques attempted: self-directed dieting  Current Exercise Habits has done a little stretches but exercise has been limited recently due to dizziness   Current Eating Habits Number of regular meals per day: 3 Number of snacking episodes per day: 2 Who shops for food? patient and husband Who prepares food? patient and husband Who eats with patient? patient and husband Binge behavior?: no Purge behavior? no Anorexic behavior? no Eating precipitated by stress? no  Guilt feelings associated with eating? no   Other Potential Contributing Factors Use of alcohol: average 0 drinks/week Use of medications that may cause weight gain none Comorbidities: dyslipidemias and GERD The following portions of the patient's history were reviewed and updated as appropriate: allergies, current medications and  problem list.    Objective:    BP 128/78   Pulse 82   Ht 5\' 6"  (1.676 m)   Wt 195 lb (88.5 kg)   LMP 12/21/2015 (Approximate)   BMI 31.47 kg/m  Body mass index is 31.47 kg/m.   Patient has lost 1# since our last visit 11/19/2015 and patient has a lost a total of 17# since 09/01/2015  BMI has improved from 34.1 to 31.47   Assessment:    Obesity with BMI and comorbidities as noted above - patient has almost reached initial weight loss goal of 20#.   Medication Management -    Plan:    1. Diagnostic studies to rule out secondary causes of obesity: none 2.  Continue to hold both phentermine and topiramate until visit with neurologist.  3. Diet interventions: diet diary indefinitely, moderate (500 kCal/d) deficit diet and qualitative changes (increase low-fat,  high-fiber foods).  Reviewed food choice and limiting portion sizes 4. Exercise intervention - on hold until assessed by neurologist due to dizziness 5. Continue to hold topiramate 50mg  qd and phentermine 37.5mg  to 1 tablet daily 6.  Checked patient's formulary for covered medications. It appears an alternative triptan was prescribed - naratriptan but rx was printed and patient had not picked up.  Rx was called into her pharmacy today.  It was covered but only #9 tabs instead of #10.  Patient to pick up today 7. Follow up:  PCP in Feb 2018; me  March 28th  No orders of the defined types were placed in this encounter.

## 2016-01-26 ENCOUNTER — Encounter: Payer: Self-pay | Admitting: Pharmacist

## 2016-01-27 ENCOUNTER — Encounter: Payer: Self-pay | Admitting: Neurology

## 2016-01-27 ENCOUNTER — Ambulatory Visit (INDEPENDENT_AMBULATORY_CARE_PROVIDER_SITE_OTHER): Payer: No Typology Code available for payment source | Admitting: Neurology

## 2016-01-27 ENCOUNTER — Encounter: Payer: Self-pay | Admitting: *Deleted

## 2016-01-27 VITALS — BP 120/88 | HR 85 | Ht 66.0 in | Wt 194.2 lb

## 2016-01-27 DIAGNOSIS — H8110 Benign paroxysmal vertigo, unspecified ear: Secondary | ICD-10-CM | POA: Diagnosis not present

## 2016-01-27 DIAGNOSIS — R51 Headache: Secondary | ICD-10-CM

## 2016-01-27 DIAGNOSIS — R519 Headache, unspecified: Secondary | ICD-10-CM

## 2016-01-27 MED ORDER — TOPIRAMATE 25 MG PO TABS: ORAL_TABLET | ORAL | 5 refills | Status: DC

## 2016-01-27 NOTE — Progress Notes (Signed)
Follow-up Visit   Date: 01/27/16    Kathy Ewing MRN: [id number] DOB: July 11, 1970   Interim History: Kathy Ewing is a 46 y.o. right-handed Caucasian female with GERD, vitamin B12 deficiency (Jan 2016), anxiety, tobacco use, and migraines returning to the clinic for follow-up of migraines.  The patient was accompanied to the clinic by self.  History of present illness: She started having holocephalic headaches 2011 associated with nausea, vomiting, photophobia. She complains of having a daily low-grade headache and severe migraine about twice per week. She has not been on a daily preventative medication. She has previously taken ibuprofen, tylenol, advil, and Aleve without any benefit. Imitrex eases her pain, but does not completely alleviate the pain.  She also complains of numbness/tingling involving her whole body (face, arm, and legs). She feels as if something is pricking her. She also feel generalized fatigue, low energy, and low mood. In September 2015, she reports that her sister with multiple sclerosis passed away of unknown etiology. Afterwards, her numbness tingling started and headaches have become worse. She was also recently found to have vitamin B 12 deficiency and has started B 12 injections in January 2016.  UPDATE 04/21/2014:  At her last visit, I started amitriptyline for her headaches/paresthesias which she increased to 20mg  and has noticed improvement in headaches and insomnia.  She is now having 1-4 headache days per week and had reduced her NSAIDs to once per week.  Her numbness/tingling and chest pain is also much better and says that she can go a week without having any spells.    UPDATE 01/27/2016:  She was lost to follow-up and returns today because of worsening headaches.  She is not on amitriptyline and does not recall why this was stopped.  Most recently, she was started on topiramate 25mg  and titrated 50mg  daily in combination with phenteramine for  weight loss.  She felt that her headaches were well controlled on this combination. Headaches occurred about twice per week when she was taking phenteramine and topiramate, but since this was stopped they occur daily.  She started having dizziness and was told to discontinue these medication, but reports that her dizziness has not improved.  She feels if the room is spinning, which is exacerbated by abrupt head movements and motion.  She tried meclizine which helped a little.  She has not seen ENT or tried vestibular therapy.    Medications:  Current Outpatient Prescriptions on File Prior to Visit  Medication Sig Dispense Refill  . ALPRAZolam (XANAX) 0.5 MG tablet TAKE 1 TO 2 TABLETS BY MOUTH TWICE DAILYAS NEEDED FOR ANXIETY 60 tablet 2  . atorvastatin (LIPITOR) 40 MG tablet take 1 tablet by mouth once daily 30 tablet 0  . meclizine (ANTIVERT) 25 MG tablet Take 1 tablet (25 mg total) by mouth 3 (three) times daily as needed for dizziness. 30 tablet 0  . naratriptan (AMERGE) 2.5 MG tablet Take 1 tablet (2.5 mg total) by mouth as needed for migraine. Take one (1) tablet at onset of headache; if returns or does not resolve, may repeat after 4 hours; do not exceed five (5) mg in 24 hours. 10 tablet 0  . omeprazole (PRILOSEC) 40 MG capsule Take 1 capsule (40 mg total) by mouth daily. On an empty stomach 60 capsule 5   Current Facility-Administered Medications on File Prior to Visit  Medication Dose Route Frequency Provider Last Rate Last Dose  . cyanocobalamin ((VITAMIN B-12)) injection 1,000 mcg  1,000 mcg Intramuscular  Q30 days Mechele Claude, MD   1,000 mcg at 01/25/16 1622    Allergies: No Known Allergies  Review of Systems:  CONSTITUTIONAL: No fevers, chills, night sweats, or weight loss.  EYES: No visual changes or eye pain ENT: No hearing changes.  No history of nose bleeds.   RESPIRATORY: No cough, wheezing and shortness of breath.   CARDIOVASCULAR: Negative for chest pain, and  palpitations.   GI: Negative for abdominal discomfort, blood in stools or black stools.  No recent change in bowel habits.   GU:  No history of incontinence.   MUSCLOSKELETAL: No history of joint pain or swelling.  No myalgias.   SKIN: Negative for lesions, rash, and itching.   ENDOCRINE: Negative for cold or heat intolerance, polydipsia or goiter.   PSYCH:  + depression or anxiety symptoms.   NEURO: As Above.   Vital Signs:  BP 120/88   Pulse 85   Ht 5\' 6"  (1.676 m)   Wt 194 lb 4 oz (88.1 kg)   LMP 12/21/2015 (Approximate)   SpO2 98%   BMI 31.35 kg/m   Neurological Exam: MENTAL STATUS including orientation to time, place, person, recent and remote memory, attention span and concentration, language, and fund of knowledge is normal.  Speech is not dysarthric.  CRANIAL NERVES:  No visual field defects. Fundoscopic exam is normal.  Pupils equal round and reactive to light.  Normal conjugate, extra-ocular eye movements in all directions of gaze. Fatigable nystagmus to the right.  No ptosis.  Face is symmetric. Palate elevates symmetrically.  Tongue is midline.  MOTOR:  Motor strength is 5/5 in all extremities.  Tone is normal.    MSRs:  Reflexes are symmetric and brisk 3+/4 throughout, except 2+/4 at Achilles bilaterally.  COORDINATION/GAIT:  Gait narrow based and stable.   Data: Lab Results  Component Value Date   VITAMINB12 394 07/31/2014     IMPRESSION/PLAN: Chronic daily headaches, worsening  - She last saw me in 2016 and was doing well on amitriptyline 30mg  at bedtime, but unclear why this was stopped  - More recently, she was taking topiramate for weight loss which helped. She was also taking phenteramine and with this combination, she developed dizziness, so was instructed to stop both medications.  Despite stopping her medications, her dizziness has not resolved.  Based on her exam, I suspect that her vertigo is stemming from inner ear pathology and less likely central  etiology. She may need ENT evaluation if this does not improve  - For her headaches and added weight loss benefit, I recommend restarting topirmate 25mg  daily x 2 weeks, then increase to 50mg  daily.   - She was instructed call with an update in 1 month to determine further titration of topiramate.   - She is interested in transferring her case to a neurology practice closer to her home. She will inquire which practices are within her network and request referral through her PCP  The duration of this appointment visit was 25 minutes of face-to-face time with the patient.  Greater than 50% of this time was spent in counseling, explanation of diagnosis, planning of further management, and coordination of care.   Thank you for allowing me to participate in patient's care.  If I can answer any additional questions, I would be pleased to do so.    Sincerely,    Anaia Frith K. Allena Katz, DO

## 2016-01-27 NOTE — Patient Instructions (Addendum)
Start topiramate 25mg  daily x 2 weeks, then increase to 50mg  at bedtime Call with an update in 1 month, to see if we need to increase the medication more If your dizziness does not improve, recommend evaluation with ENT

## 2016-01-29 ENCOUNTER — Other Ambulatory Visit: Payer: Self-pay | Admitting: Family Medicine

## 2016-01-29 DIAGNOSIS — F419 Anxiety disorder, unspecified: Secondary | ICD-10-CM

## 2016-02-05 NOTE — Telephone Encounter (Signed)
rx called into pharmacy

## 2016-02-23 ENCOUNTER — Encounter: Payer: Self-pay | Admitting: Family Medicine

## 2016-02-23 ENCOUNTER — Ambulatory Visit (INDEPENDENT_AMBULATORY_CARE_PROVIDER_SITE_OTHER): Payer: PRIVATE HEALTH INSURANCE | Admitting: Family Medicine

## 2016-02-23 ENCOUNTER — Ambulatory Visit: Payer: PRIVATE HEALTH INSURANCE | Admitting: Family Medicine

## 2016-02-23 VITALS — BP 107/75 | HR 75 | Temp 97.6°F | Ht 66.0 in | Wt 195.0 lb

## 2016-02-23 DIAGNOSIS — F419 Anxiety disorder, unspecified: Secondary | ICD-10-CM | POA: Diagnosis not present

## 2016-02-23 DIAGNOSIS — E538 Deficiency of other specified B group vitamins: Secondary | ICD-10-CM | POA: Diagnosis not present

## 2016-02-23 DIAGNOSIS — G43709 Chronic migraine without aura, not intractable, without status migrainosus: Secondary | ICD-10-CM

## 2016-02-23 DIAGNOSIS — E782 Mixed hyperlipidemia: Secondary | ICD-10-CM | POA: Diagnosis not present

## 2016-02-23 MED ORDER — ALPRAZOLAM 0.5 MG PO TABS: ORAL_TABLET | ORAL | 4 refills | Status: DC

## 2016-02-23 NOTE — Progress Notes (Signed)
Subjective:  Patient ID: Kathy Ewing, female    DOB: 1970/08/17  Age: 46 y.o. MRN: [id number]  CC: Hyperlipidemia (pt here today for routine follow up on her cholesterol)   HPI Kathy Ewing presents for [employer] of anxiety. She quit the Topamax until she saw neurology. She is now back on half a pill daily and not having symptoms. She's only been on it for about a week. Once she has been on it 2 weeks the plan is to increase to a whole 50 mg tablet. She lives in [place] and works near [place]. She is asking to see a neurologist in [place]. She continues to occasionally use the Xanax for her anxiety. Her home situation spelled bit more stable but still she worries about her daughter.  Daughter made suicide attempt several mos ago. Oldest daughter died 9 years ago.  Alprazolam helps. Taking prn. 2-3 times aweek, more or less Also started back on phentermine. She states that emerged did not help with her headache. She discontinued its use after the first month supply of 9 pills because they were ineffective. History Kathy Ewing has a past medical history of Anxiety; B12 deficiency; Cholelithiasis; GERD (gastroesophageal reflux disease); Headache; High cholesterol; Numbness; Pneumonia; and Vitamin D deficiency.   She has a past surgical history that includes Cesarean section and Cholecystectomy (N/A, 10/02/2014).   Her family history includes Anxiety disorder in her brother; Cerebral palsy in her daughter; Depression in her daughter; Hypertension in her mother; Migraines in her sister; Multiple sclerosis in her sister.She reports that she has been smoking Cigarettes.  She has a 15.00 pack-year smoking history. She has never used smokeless tobacco. She reports that she does not drink alcohol or use drugs.    ROS Review of Systems  Constitutional: Negative for activity change, appetite change and fever.  HENT: Negative for congestion, rhinorrhea and sore throat.   Eyes: Negative for visual  disturbance.  Respiratory: Negative for cough and shortness of breath.   Cardiovascular: Negative for chest pain and palpitations.  Gastrointestinal: Negative for abdominal pain, diarrhea and nausea.  Genitourinary: Negative for dysuria.  Musculoskeletal: Negative for arthralgias and myalgias.  Psychiatric/Behavioral: Positive for agitation and dysphoric mood.    Objective:  BP 107/75   Pulse 75   Temp 97.6 F (36.4 C) (Oral)   Ht 5' 6"  (1.676 m)   Wt 195 lb (88.5 kg)   LMP 02/14/2016 (Exact Date)   BMI 31.47 kg/m   BP Readings from Last 3 Encounters:  02/23/16 107/75  01/27/16 120/88  01/26/16 128/78    Wt Readings from Last 3 Encounters:  02/23/16 195 lb (88.5 kg)  01/27/16 194 lb 4 oz (88.1 kg)  01/26/16 195 lb (88.5 kg)     Physical Exam  Constitutional: She is oriented to person, place, and time. She appears well-developed and well-nourished. No distress.  HENT:  Head: Normocephalic and atraumatic.  Right Ear: External ear normal.  Left Ear: External ear normal.  Nose: Nose normal.  Mouth/Throat: Oropharynx is clear and moist.  Eyes: Conjunctivae and EOM are normal. Pupils are equal, round, and reactive to light.  Neck: Normal range of motion. Neck supple. No thyromegaly present.  Cardiovascular: Normal rate, regular rhythm and normal heart sounds.   No murmur heard. Pulmonary/Chest: Effort normal and breath sounds normal. No respiratory distress. She has no wheezes. She has no rales.  Abdominal: Soft. Bowel sounds are normal. She exhibits no distension. There is no tenderness.  Lymphadenopathy:    She  has no cervical adenopathy.  Neurological: She is alert and oriented to person, place, and time. She has normal reflexes.  Skin: Skin is warm and dry.  Psychiatric: Her behavior is normal. Judgment normal.     Lab Results  Component Value Date   WBC 10.2 01/04/2016   HGB 13.3 10/02/2014   HCT 39.6 01/04/2016   PLT 402 (H) 01/04/2016   GLUCOSE 82  01/04/2016   CHOL 128 04/04/2015   TRIG 79 04/04/2015   HDL 39 (L) 04/04/2015   LDLCALC 73 04/04/2015   ALT 16 01/04/2016   AST 14 01/04/2016   NA 142 01/04/2016   K 4.4 01/04/2016   CL 106 01/04/2016   CREATININE 0.91 01/04/2016   BUN 9 01/04/2016   CO2 20 01/04/2016   TSH 4.220 01/04/2016    Ct Abdomen Pelvis W Contrast  Result Date: 10/28/2014 CLINICAL DATA:  Lower abdominal pain, nausea. Prior cholecystectomy 10/02/2014. EXAM: CT ABDOMEN AND PELVIS WITH CONTRAST TECHNIQUE: Multidetector CT imaging of the abdomen and pelvis was performed using the standard protocol following bolus administration of intravenous contrast. CONTRAST:  1 OMNIPAQUE IOHEXOL 300 MG/ML SOLN, 168m ISOVUE-300 IOPAMIDOL (ISOVUE-300) INJECTION 61% COMPARISON:  Ultrasound 09/15/2014 FINDINGS: Lung bases are clear.  No effusions.  Heart is normal size. Small cysts scattered throughout the liver which appear benign. Prior cholecystectomy. Spleen, pancreas, adrenals and kidneys are normal. Bowel grossly unremarkable. No free fluid, free air, or adenopathy. Appendix is visualized and is normal. Uterus, adnexae and urinary bladder normal. No acute bony abnormality or focal bone lesion. IMPRESSION: No acute findings in the abdomen or pelvis. No complicating feature from recent cholecystectomy. Scattered small benign-appearing hepatic cysts. Electronically Signed   By: KRolm BaptiseM.D.   On: 10/28/2014 10:37    Assessment & Plan:   MCheetarawas seen today for hyperlipidemia.  Diagnoses and all orders for this visit:  Chronic migraine without aura without status migrainosus, not intractable -     CMP14+EGFR -     CBC with Differential/Platelet -     Ambulatory referral to Neurology  Anxiety -     ALPRAZolam (XANAX) 0.5 MG tablet; take 1 to 2 tablets by mouth twice a day if needed for anxiety  Mixed hyperlipidemia -     CMP14+EGFR -     CBC with Differential/Platelet -     Lipid panel  Vitamin B 12 deficiency -      CMP14+EGFR -     CBC with Differential/Platelet -     Vitamin B12    I have discontinued Ms. Mccole's meclizine and naratriptan. I am also having her maintain her topiramate, atorvastatin, omeprazole, phentermine, and ALPRAZolam. We will continue to administer cyanocobalamin.  Meds ordered this encounter  Medications  . phentermine (ADIPEX-P) 37.5 MG tablet    Sig: Take 37.5 mg by mouth daily.    Refill:  0  . ALPRAZolam (XANAX) 0.5 MG tablet    Sig: take 1 to 2 tablets by mouth twice a day if needed for anxiety    Dispense:  60 tablet    Refill:  4     Follow-up: Return in about 6 months (around 08/22/2016).  WClaretta Fraise M.D.

## 2016-02-24 LAB — CMP14+EGFR
A/G RATIO: 1.6 (ref 1.2–2.2)
ALK PHOS: 101 IU/L (ref 39–117)
ALT: 17 IU/L (ref 0–32)
AST: 19 IU/L (ref 0–40)
Albumin: 4.1 g/dL (ref 3.5–5.5)
BILIRUBIN TOTAL: 0.4 mg/dL (ref 0.0–1.2)
BUN/Creatinine Ratio: 15 (ref 9–23)
BUN: 12 mg/dL (ref 6–24)
CO2: 22 mmol/L (ref 18–29)
Calcium: 9.3 mg/dL (ref 8.7–10.2)
Chloride: 102 mmol/L (ref 96–106)
Creatinine, Ser: 0.81 mg/dL (ref 0.57–1.00)
GFR calc Af Amer: 103 mL/min/{1.73_m2} (ref >59)
GFR calc non Af Amer: 89 mL/min/{1.73_m2} (ref >59)
GLOBULIN, TOTAL: 2.6 g/dL (ref 1.5–4.5)
Glucose: 90 mg/dL (ref 65–99)
POTASSIUM: 4.7 mmol/L (ref 3.5–5.2)
SODIUM: 140 mmol/L (ref 134–144)
Total Protein: 6.7 g/dL (ref 6.0–8.5)

## 2016-02-24 LAB — CBC WITH DIFFERENTIAL/PLATELET
BASOS: 2 %
Basophils Absolute: 0.1 10*3/uL (ref 0.0–0.2)
EOS (ABSOLUTE): 0.5 10*3/uL — AB (ref 0.0–0.4)
Eos: 8 %
Hematocrit: 37.9 % (ref 34.0–46.6)
Hemoglobin: 12.2 g/dL (ref 11.1–15.9)
IMMATURE GRANULOCYTES: 0 %
Immature Grans (Abs): 0 10*3/uL (ref 0.0–0.1)
LYMPHS ABS: 1.6 10*3/uL (ref 0.7–3.1)
Lymphs: 27 %
MCH: 27.5 pg (ref 26.6–33.0)
MCHC: 32.2 g/dL (ref 31.5–35.7)
MCV: 85 fL (ref 79–97)
MONOS ABS: 0.7 10*3/uL (ref 0.1–0.9)
Monocytes: 11 %
Neutrophils Absolute: 3.3 10*3/uL (ref 1.4–7.0)
Neutrophils: 52 %
PLATELETS: 375 10*3/uL (ref 150–379)
RBC: 4.44 x10E6/uL (ref 3.77–5.28)
RDW: 14.2 % (ref 12.3–15.4)
WBC: 6.1 10*3/uL (ref 3.4–10.8)

## 2016-02-24 LAB — LIPID PANEL
CHOLESTEROL TOTAL: 164 mg/dL (ref 100–199)
Chol/HDL Ratio: 3.8 ratio units (ref 0.0–4.4)
HDL: 43 mg/dL (ref >39)
LDL Calculated: 106 mg/dL — ABNORMAL HIGH (ref 0–99)
Triglycerides: 77 mg/dL (ref 0–149)
VLDL Cholesterol Cal: 15 mg/dL (ref 5–40)

## 2016-02-24 LAB — VITAMIN B12: Vitamin B-12: 626 pg/mL (ref 232–1245)

## 2016-02-26 ENCOUNTER — Ambulatory Visit (INDEPENDENT_AMBULATORY_CARE_PROVIDER_SITE_OTHER): Payer: PRIVATE HEALTH INSURANCE | Admitting: *Deleted

## 2016-02-26 DIAGNOSIS — Z23 Encounter for immunization: Secondary | ICD-10-CM

## 2016-02-26 DIAGNOSIS — E538 Deficiency of other specified B group vitamins: Secondary | ICD-10-CM | POA: Diagnosis not present

## 2016-02-26 NOTE — Progress Notes (Signed)
Pt given Vit B12 inj Tolerated well 

## 2016-03-11 ENCOUNTER — Ambulatory Visit: Payer: 59 | Admitting: Pediatrics

## 2016-03-14 ENCOUNTER — Other Ambulatory Visit: Payer: Self-pay | Admitting: Family Medicine

## 2016-03-18 ENCOUNTER — Other Ambulatory Visit: Payer: Self-pay | Admitting: Family Medicine

## 2016-03-18 ENCOUNTER — Telehealth: Payer: Self-pay | Admitting: Family Medicine

## 2016-03-18 NOTE — Telephone Encounter (Signed)
In order to consider this patient request, the patient will need to see a provider. Have her come Sat. AM

## 2016-03-18 NOTE — Telephone Encounter (Signed)
Pt has odor, discharge & lower abd pain. Is unable to get off of work to be seen

## 2016-03-19 NOTE — Telephone Encounter (Signed)
Pt aware she would NTBS before treatment could be sent in. She lives an hour away from the office. Did offer her an appointment today.

## 2016-04-06 ENCOUNTER — Other Ambulatory Visit: Payer: Self-pay | Admitting: Family Medicine

## 2016-04-13 ENCOUNTER — Encounter: Payer: Self-pay | Admitting: *Deleted

## 2016-04-13 ENCOUNTER — Ambulatory Visit: Payer: Self-pay | Admitting: Pharmacist

## 2016-04-18 ENCOUNTER — Encounter: Payer: Self-pay | Admitting: Family Medicine

## 2016-05-04 ENCOUNTER — Ambulatory Visit (INDEPENDENT_AMBULATORY_CARE_PROVIDER_SITE_OTHER): Payer: PRIVATE HEALTH INSURANCE | Admitting: *Deleted

## 2016-05-04 DIAGNOSIS — E538 Deficiency of other specified B group vitamins: Secondary | ICD-10-CM | POA: Diagnosis not present

## 2016-05-04 NOTE — Progress Notes (Signed)
Patient tolerated B-12 injection well.

## 2016-05-28 ENCOUNTER — Other Ambulatory Visit: Payer: Self-pay | Admitting: Family Medicine

## 2016-06-06 NOTE — Telephone Encounter (Signed)
rx called into pharmacy

## 2016-07-28 ENCOUNTER — Encounter: Payer: Self-pay | Admitting: *Deleted

## 2016-07-28 ENCOUNTER — Ambulatory Visit (INDEPENDENT_AMBULATORY_CARE_PROVIDER_SITE_OTHER): Payer: PRIVATE HEALTH INSURANCE | Admitting: Physician Assistant

## 2016-07-28 ENCOUNTER — Encounter: Payer: Self-pay | Admitting: Physician Assistant

## 2016-07-28 VITALS — Temp 97.5°F | Ht 66.0 in | Wt 194.0 lb

## 2016-07-28 DIAGNOSIS — N898 Other specified noninflammatory disorders of vagina: Secondary | ICD-10-CM | POA: Diagnosis not present

## 2016-07-28 DIAGNOSIS — G43809 Other migraine, not intractable, without status migrainosus: Secondary | ICD-10-CM

## 2016-07-28 DIAGNOSIS — E538 Deficiency of other specified B group vitamins: Secondary | ICD-10-CM

## 2016-07-28 DIAGNOSIS — G43909 Migraine, unspecified, not intractable, without status migrainosus: Secondary | ICD-10-CM | POA: Insufficient documentation

## 2016-07-28 LAB — WET PREP FOR TRICH, YEAST, CLUE
CLUE CELL EXAM: NEGATIVE
TRICHOMONAS EXAM: NEGATIVE
Yeast Exam: NEGATIVE

## 2016-07-28 MED ORDER — METHYLPREDNISOLONE ACETATE 80 MG/ML IJ SUSP
80.0000 mg | Freq: Once | INTRAMUSCULAR | Status: AC
  Administered 2016-07-28: 80 mg via INTRAMUSCULAR

## 2016-07-28 MED ORDER — KETOROLAC TROMETHAMINE 60 MG/2ML IM SOLN
60.0000 mg | Freq: Once | INTRAMUSCULAR | Status: AC
  Administered 2016-07-28: 60 mg via INTRAMUSCULAR

## 2016-07-28 NOTE — Progress Notes (Signed)
Temp (!) 97.5 F (36.4 C) (Oral)   Ht 5\' 6"  (1.676 m)   Wt 194 lb (88 kg)   BMI 31.31 kg/m    Subjective:    Patient ID: Kathy Ewing, female    DOB: [birth year], 46 y.o.   MRN: [id number]  HPI: Kathy Ewing is a 46 y.o. female presenting on 07/28/2016 for Headache and vaginal odor  Headache for past two days. Has migraine history and has associated nausea, photophobia. Denies vomiting or specific trigger.  Has copious discharge without history of sexual exposure. Complains of odor.  Relevant past medical, surgical, family and social history reviewed and updated as indicated. Allergies and medications reviewed and updated.  Past Medical History:  Diagnosis Date  . Anxiety   . B12 deficiency   . Cholelithiasis   . GERD (gastroesophageal reflux disease)   . Headache    migraine   . High cholesterol   . Numbness   . Pneumonia   . Vitamin D deficiency     Past Surgical History:  Procedure Laterality Date  . CESAREAN SECTION     x 2  . CHOLECYSTECTOMY N/A 10/02/2014   Procedure: LAPAROSCOPIC CHOLECYSTECTOMY;  Surgeon: Rolm Bookbinder, MD;  Location: [facility];  Service: General;  Laterality: N/A;    Review of Systems  Constitutional: Negative.  Negative for activity change, fatigue and fever.  HENT: Negative.   Eyes: Negative.   Respiratory: Negative.  Negative for cough.   Cardiovascular: Negative.  Negative for chest pain.  Gastrointestinal: Positive for nausea. Negative for abdominal pain.  Endocrine: Negative.   Genitourinary: Positive for vaginal discharge. Negative for dysuria, pelvic pain, urgency and vaginal pain.  Musculoskeletal: Negative.   Skin: Negative.   Neurological: Positive for headaches.    Allergies as of 07/28/2016   No Known Allergies     Medication List       Accurate as of 07/28/16  9:46 PM. Always use your most recent med list.          ALPRAZolam 0.5 MG tablet Commonly known as:  XANAX take 1 to 2 tablets by mouth twice a day  if needed for anxiety   atorvastatin 40 MG tablet Commonly known as:  LIPITOR take 1 tablet by mouth once daily   omeprazole 40 MG capsule Commonly known as:  PRILOSEC take 1 capsule by mouth once daily ON EMPTY STOMACH   phentermine 37.5 MG tablet Commonly known as:  ADIPEX-P take 1 tablet by mouth daily before BREAKFAST   topiramate 50 MG tablet Commonly known as:  TOPAMAX take 1 tablet by mouth once daily          Objective:    Temp (!) 97.5 F (36.4 C) (Oral)   Ht 5\' 6"  (1.676 m)   Wt 194 lb (88 kg)   BMI 31.31 kg/m   No Known Allergies  Physical Exam  Constitutional: She is oriented to person, place, and time. She appears well-developed and well-nourished.  HENT:  Head: Normocephalic and atraumatic.  Eyes: Pupils are equal, round, and reactive to light. Conjunctivae and EOM are normal.  Cardiovascular: Normal rate, regular rhythm, normal heart sounds and intact distal pulses.   Pulmonary/Chest: Effort normal and breath sounds normal.  Abdominal: Soft. Bowel sounds are normal.  Neurological: She is alert and oriented to person, place, and time. She has normal reflexes.  Skin: Skin is warm and dry. No rash noted.  Psychiatric: She has a normal mood and affect. Her behavior is normal.  Judgment and thought content normal.  Nursing note and vitals reviewed.   Results for orders placed or performed in visit on 07/28/16  WET PREP FOR [place], YEAST, CLUE  Result Value Ref Range   Trichomonas Exam Negative Negative   Yeast Exam Negative Negative   Clue Cell Exam Negative Negative      Assessment & Plan:   1. Vaginal odor - WET PREP FOR TRICH, YEAST, CLUE NEGative findings  2. Other migraine without status migrainosus, not intractable - methylPREDNISolone acetate (DEPO-MEDROL) injection 80 mg; Inject 1 mL (80 mg total) into the muscle once. - ketorolac (TORADOL) injection 60 mg; Inject 2 mLs (60 mg total) into the muscle once.   Current Outpatient  Prescriptions:  .  ALPRAZolam (XANAX) 0.5 MG tablet, take 1 to 2 tablets by mouth twice a day if needed for anxiety, Disp: 60 tablet, Rfl: 4 .  atorvastatin (LIPITOR) 40 MG tablet, take 1 tablet by mouth once daily, Disp: 30 tablet, Rfl: 1 .  omeprazole (PRILOSEC) 40 MG capsule, take 1 capsule by mouth once daily ON EMPTY STOMACH, Disp: 60 capsule, Rfl: 4 .  phentermine (ADIPEX-P) 37.5 MG tablet, take 1 tablet by mouth daily before BREAKFAST, Disp: 30 tablet, Rfl: 0 .  topiramate (TOPAMAX) 50 MG tablet, take 1 tablet by mouth once daily, Disp: 30 tablet, Rfl: 1  Current Facility-Administered Medications:  .  cyanocobalamin ((VITAMIN B-12)) injection 1,000 mcg, 1,000 mcg, Intramuscular, Q30 days, Claretta Fraise, MD, 1,000 mcg at 07/28/16 1634  Continue all other maintenance medications as listed above.  Follow up plan: No Follow-up on file.  Educational handout given for Todd Mission PA-C [place] [address]  [place], [place] [address] [phone]   07/28/2016, 9:46 PM

## 2016-08-03 ENCOUNTER — Other Ambulatory Visit: Payer: Self-pay | Admitting: Family Medicine

## 2016-08-03 NOTE — Telephone Encounter (Signed)
lm

## 2016-08-05 ENCOUNTER — Ambulatory Visit: Payer: PRIVATE HEALTH INSURANCE | Admitting: Family Medicine

## 2016-08-05 ENCOUNTER — Encounter: Payer: Self-pay | Admitting: Family Medicine

## 2016-08-05 ENCOUNTER — Encounter: Payer: Self-pay | Admitting: *Deleted

## 2016-08-05 ENCOUNTER — Ambulatory Visit (INDEPENDENT_AMBULATORY_CARE_PROVIDER_SITE_OTHER): Payer: PRIVATE HEALTH INSURANCE | Admitting: Family Medicine

## 2016-08-05 VITALS — BP 110/73 | HR 77 | Temp 97.0°F | Ht 66.0 in | Wt 199.0 lb

## 2016-08-05 DIAGNOSIS — E559 Vitamin D deficiency, unspecified: Secondary | ICD-10-CM

## 2016-08-05 DIAGNOSIS — Z760 Encounter for issue of repeat prescription: Secondary | ICD-10-CM | POA: Diagnosis not present

## 2016-08-05 DIAGNOSIS — E782 Mixed hyperlipidemia: Secondary | ICD-10-CM | POA: Diagnosis not present

## 2016-08-05 DIAGNOSIS — F419 Anxiety disorder, unspecified: Secondary | ICD-10-CM

## 2016-08-05 MED ORDER — ATORVASTATIN CALCIUM 40 MG PO TABS: 40.0000 mg | ORAL_TABLET | Freq: Every day | ORAL | 3 refills | Status: DC

## 2016-08-05 MED ORDER — TOPIRAMATE 50 MG PO TABS
50.0000 mg | ORAL_TABLET | Freq: Two times a day (BID) | ORAL | 3 refills | Status: DC
Start: 2016-08-05 — End: 2016-11-17

## 2016-08-05 MED ORDER — ALPRAZOLAM 0.5 MG PO TABS: ORAL_TABLET | ORAL | 2 refills | Status: DC

## 2016-08-05 MED ORDER — OMEPRAZOLE 40 MG PO CPDR: DELAYED_RELEASE_CAPSULE | ORAL | 3 refills | Status: DC

## 2016-08-05 NOTE — Progress Notes (Signed)
   Subjective:    Patient ID: Kathy Ewing, female    DOB: [birth year], 46 y.o.   MRN: [id number]  HPI  Pt here for follow up and management of chronic medical problems which includes hyperlipidemia. She is taking medication regularly.Patient was originally scheduled to see her PCP who is out today but she needs refills on her prescription as her husband's insurance expires today and it will be at least 3 months before new insurance takes effect. She has been on alprazolam since the death of her daughter some 10 years ago. Alprazolam is effective but anxiety persists. She has also had a prescription for topiramate and phentermine for combination migraine and as a weight loss adjunct. She still has migraines but I think dose of topiramate needs to be optimized before it is abandoned.    Patient Active Problem List   Diagnosis Date Noted  . Migraine 07/28/2016  . Chronic migraine without aura without status migrainosus, not intractable 11/23/2015  . BMI 33.0-33.9,adult 08/24/2015  . Heel pain, bilateral 08/24/2015  . Mixed hyperlipidemia 03/31/2015  . Vitamin D deficiency 02/19/2014  . GERD (gastroesophageal reflux disease) 02/19/2014  . Vitamin B 12 deficiency 02/19/2014  . Tobacco abuse 12/06/2013  . Anxiety 12/06/2013   Outpatient Encounter Prescriptions as of 08/05/2016  Medication Sig  . ALPRAZolam (XANAX) 0.5 MG tablet take 1 to 2 tablets by mouth twice a day if needed for anxiety  . atorvastatin (LIPITOR) 40 MG tablet take 1 tablet by mouth once daily  . omeprazole (PRILOSEC) 40 MG capsule take 1 capsule by mouth once daily ON EMPTY STOMACH  . phentermine (ADIPEX-P) 37.5 MG tablet take 1 tablet by mouth daily before BREAKFAST  . topiramate (TOPAMAX) 25 MG tablet Take 1-2 tablets by mouth daily.  . [DISCONTINUED] topiramate (TOPAMAX) 50 MG tablet take 1 tablet by mouth once daily   Facility-Administered Encounter Medications as of 08/05/2016  Medication  . cyanocobalamin  ((VITAMIN B-12)) injection 1,000 mcg      Review of Systems  Constitutional: Negative.   HENT: Negative.   Eyes: Negative.   Respiratory: Negative.   Cardiovascular: Negative.   Gastrointestinal: Negative.   Endocrine: Negative.   Genitourinary: Negative.   Musculoskeletal: Negative.   Skin: Negative.   Allergic/Immunologic: Negative.   Neurological: Negative.        Worsening headaches  Hematological: Negative.   Psychiatric/Behavioral: Negative.        Objective:   Physical Exam  Constitutional: She is oriented to person, place, and time. She appears well-developed and well-nourished.  Cardiovascular: Normal rate, regular rhythm and normal heart sounds.   Pulmonary/Chest: Effort normal.  Neurological: She is alert and oriented to person, place, and time.  Psychiatric: She has a normal mood and affect. Her behavior is normal.    BP 110/73 (BP Location: Right Arm)   Pulse 77   Temp (!) 97 F (36.1 C) (Oral)   Ht 5\' 6"  (1.676 m)   Wt 199 lb (90.3 kg)   LMP 07/15/2016   BMI 32.12 kg/m        Assessment & Plan:  Anxiety. Agreed to refill her alprazolam at current dose. Given enough for 3 months based on usage and need. Migraine headache Increase Topamax to 50 mg twice a day. Hopefully this will helpControl headaches and also help her to lose weight through appetite suppression

## 2016-08-05 NOTE — Patient Instructions (Signed)
Continue current medications. Continue good therapeutic lifestyle changes which include good diet and exercise. Fall precautions discussed with patient. If an FOBT was given today- please return it to our front desk. If you are over 44 years old - you may need Prevnar 13 or the adult Pneumonia vaccine.  **Flu shots are available--- please call and schedule a FLU-CLINIC appointment**  After your visit with us today you will receive a survey in the mail or online from [employer] regarding your care with us. Please take a moment to fill this out. Your feedback is very important to us as you can help us better understand your patient needs as well as improve your experience and satisfaction. WE CARE ABOUT YOU!!!    

## 2016-08-23 ENCOUNTER — Ambulatory Visit: Payer: PRIVATE HEALTH INSURANCE | Admitting: Family Medicine

## 2016-08-29 ENCOUNTER — Ambulatory Visit: Payer: PRIVATE HEALTH INSURANCE | Admitting: Family Medicine

## 2016-09-23 IMAGING — US US TRANSVAGINAL NON-OB
1 series · 13 of 25 positions shown · non-contrast
Comparison: 04/09/2014

CLINICAL DATA: Pelvic pain for 3 weeks, history of ovarian cysts
and prior Caesarean sections

EXAM:
TRANSABDOMINAL AND TRANSVAGINAL ULTRASOUND OF PELVIS
TECHNIQUE: Both transabdominal and transvaginal ultrasound examinations of the
pelvis were performed. Transabdominal technique was performed for
global imaging of the pelvis including uterus, ovaries, adnexal
regions, and pelvic cul-de-sac. It was necessary to proceed with
endovaginal exam following the transabdominal exam to visualize the
endometrium and ovaries. Images are not yet in PACs but are reviewed
at the modality.

[Series 1: us transvaginal non-ob · 0.22mm/px · 13 of 207 slices shown]
[im 1/207]
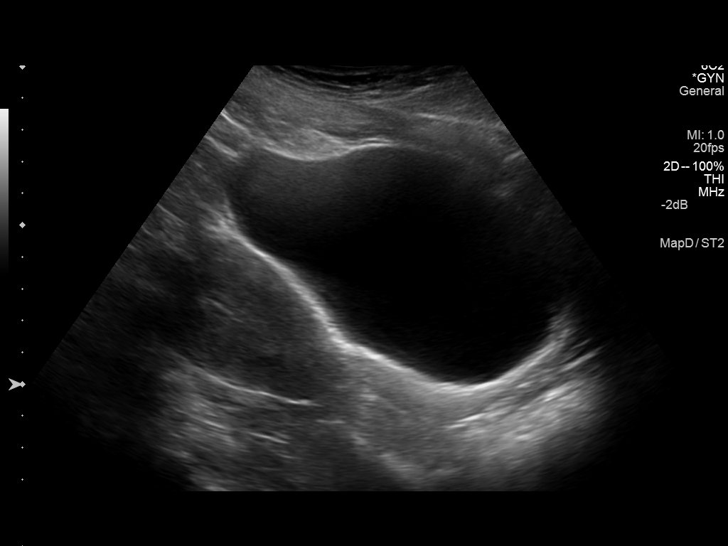
[im 18/207]
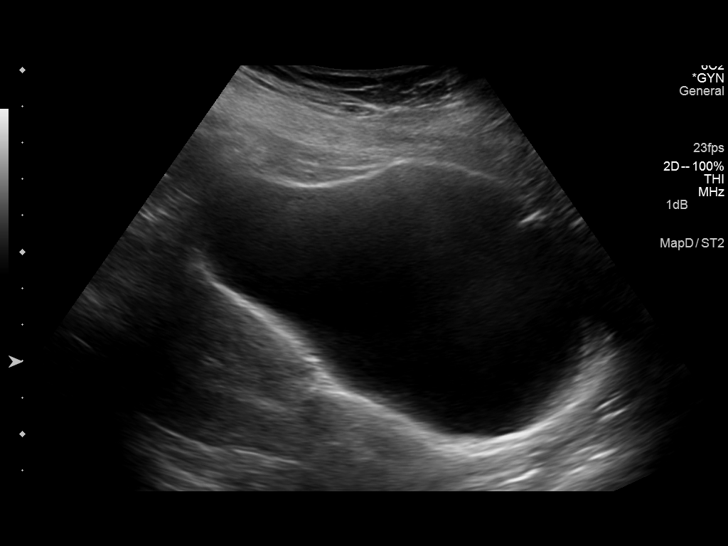
[im 35/207]
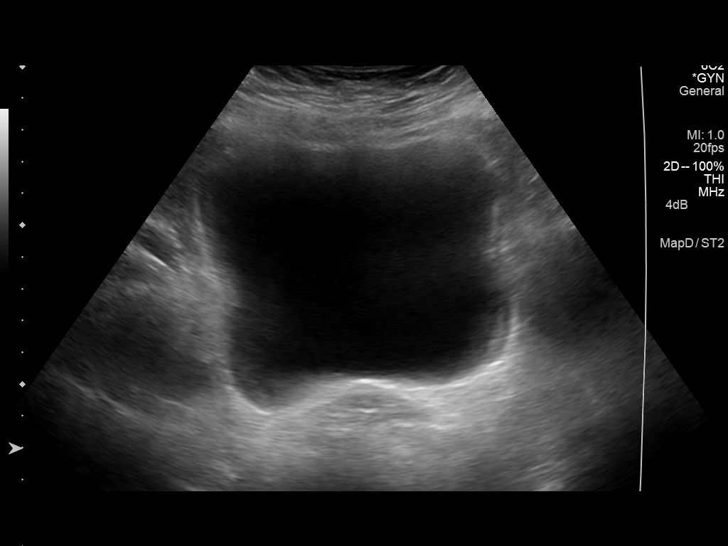
[im 52/207]
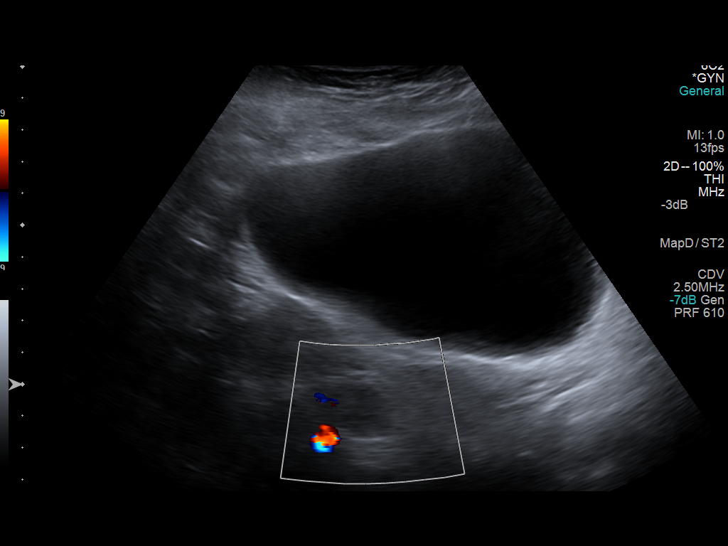
[im 69/207]
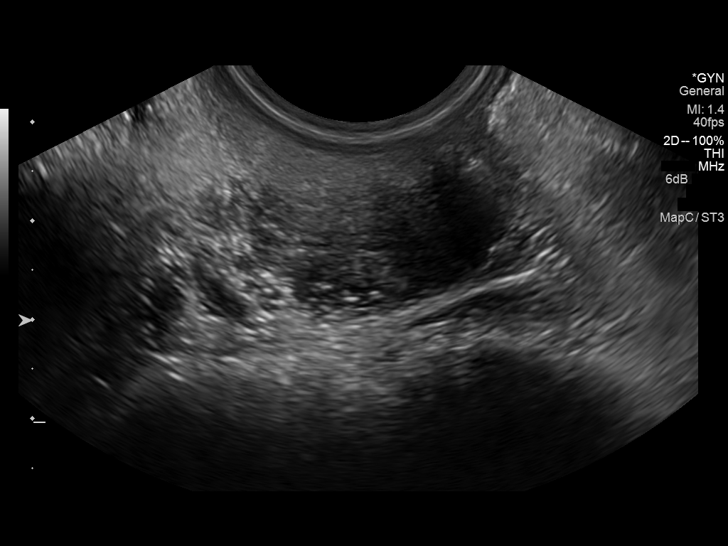
[im 86/207]
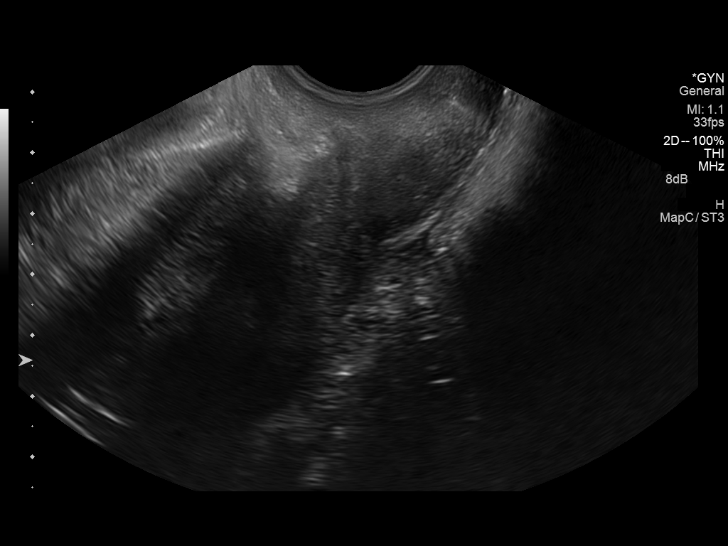
[im 104/207]
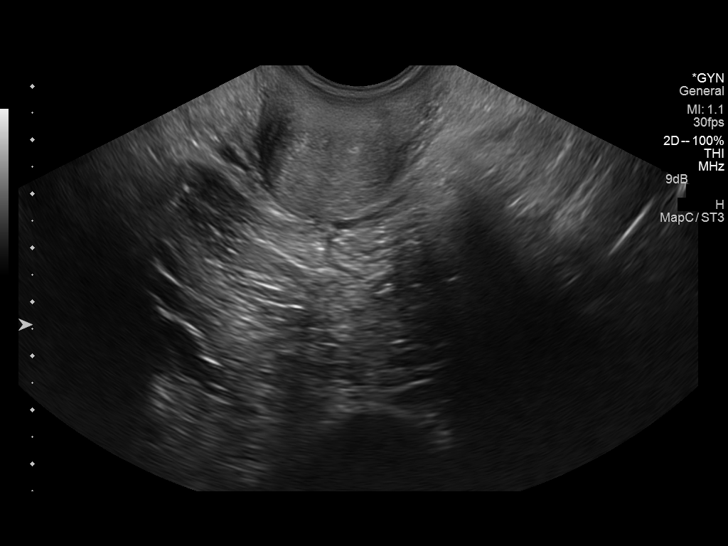
[im 121/207]
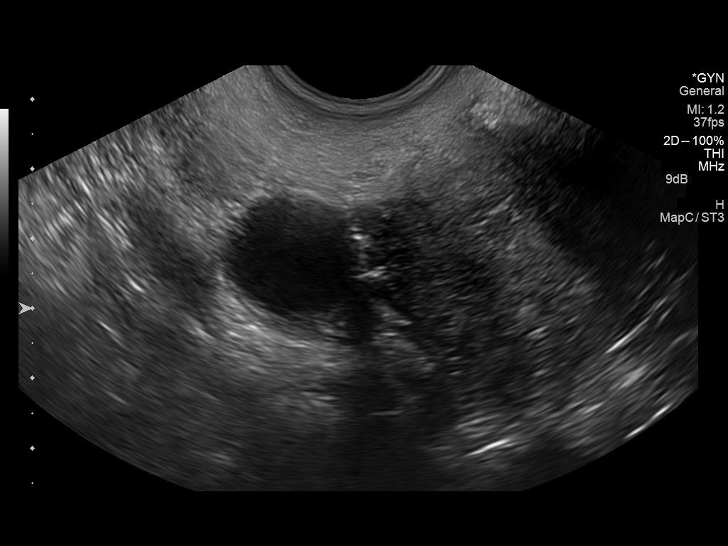
[im 138/207]
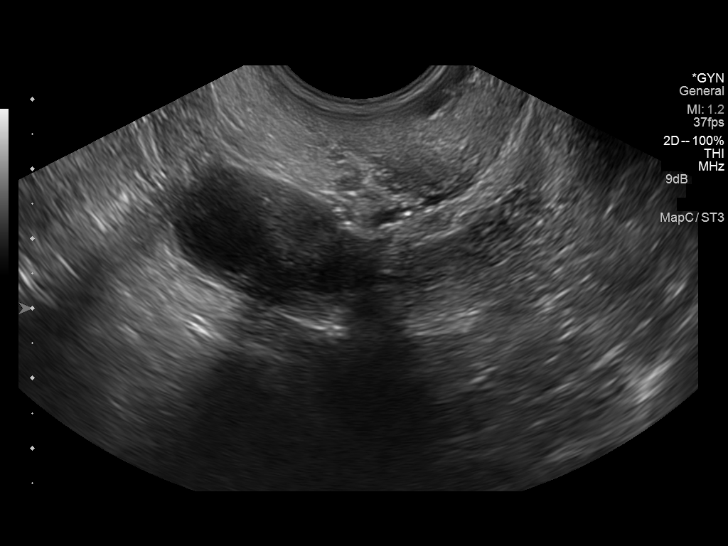
[im 155/207]
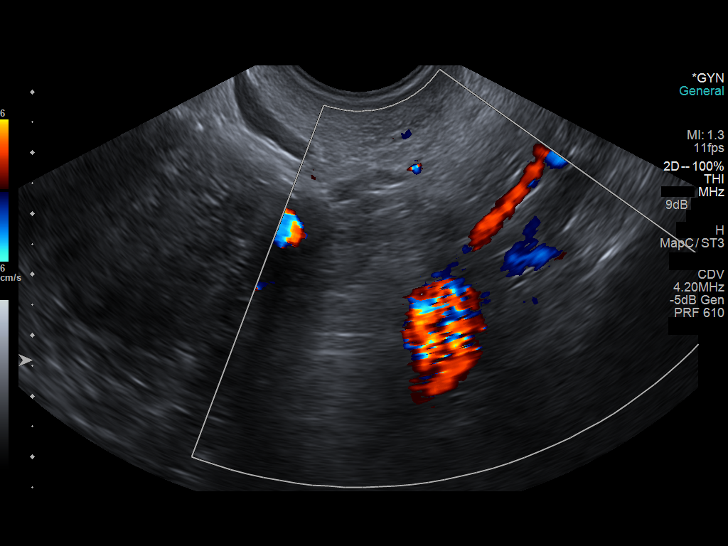
[im 172/207]
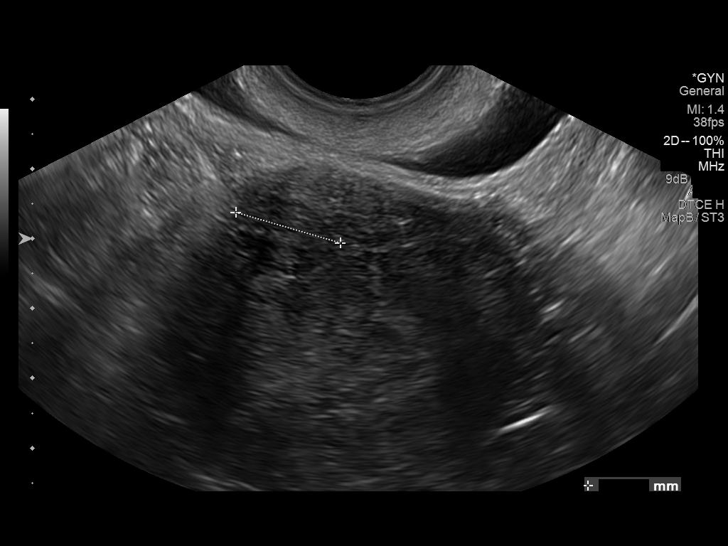
[im 189/207]
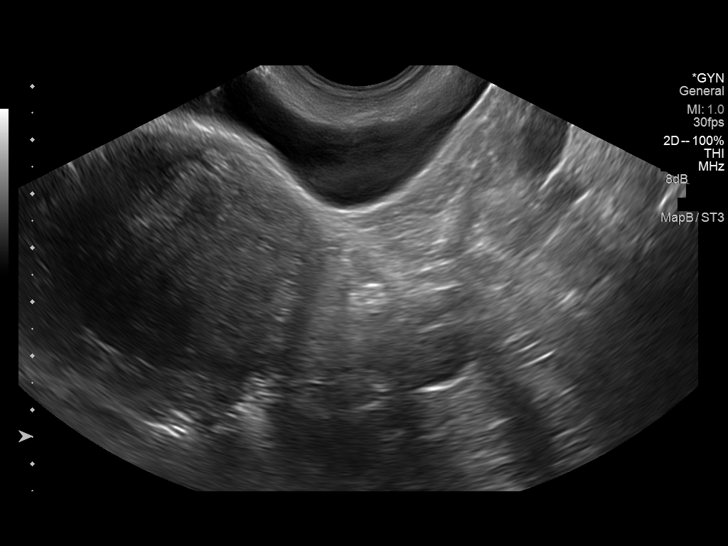
[im 207/207]
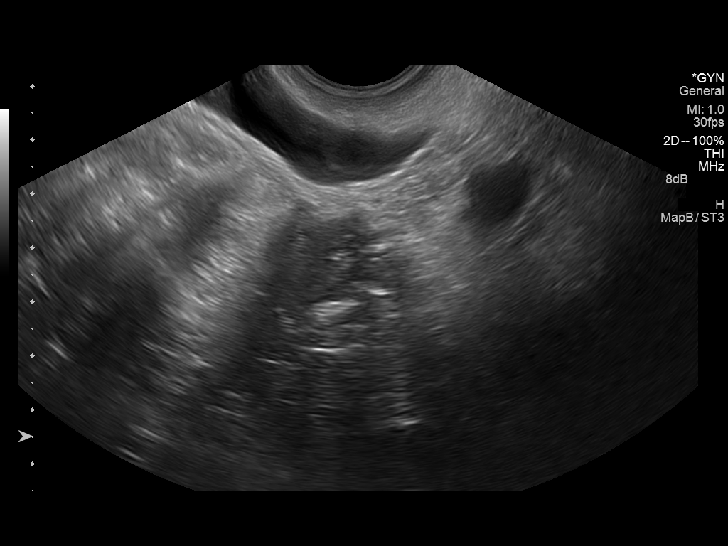

[13 of 25 positions shown; findings below may reference images not displayed]

FINDINGS: Uterus

Measurements: 9.8 x 4.0 x 5.3 cm. Nodule identified at RIGHT lateral
aspect of uterine fundus likely representing a leiomyoma 1.9 x 2.2 x
1.6 cm. No additional uterine masses.

Endometrium

Thickness: 7 mm thick, normal. Normal tri-layered appearance without
focal abnormality. Small amount of endocervical fluid noted.

Right ovary

Measurements: 3.4 x 2.0 x 2.0 cm. Dominant follicle 1.8 cm diameter.
Otherwise normal appearance.

Left ovary

Measurements: 2.4 x 1.9 x 1.7 cm. Normal morphology without mass.

Other findings

Trace free pelvic fluid.  No other pelvic masses.
IMPRESSION: Probable small uterine leiomyoma at RIGHT fundus 1.9 x 2.2 x 1.6 cm.

Otherwise normal exam.

## 2016-11-12 ENCOUNTER — Other Ambulatory Visit: Payer: Self-pay | Admitting: Family Medicine

## 2016-11-12 DIAGNOSIS — F419 Anxiety disorder, unspecified: Secondary | ICD-10-CM

## 2016-11-15 NOTE — Telephone Encounter (Signed)
lmtcb

## 2016-11-15 NOTE — Telephone Encounter (Signed)
Left message for pt to call back. Pt needs an appointment for refill on xanax.

## 2016-11-17 ENCOUNTER — Encounter: Payer: Self-pay | Admitting: Family Medicine

## 2016-11-17 ENCOUNTER — Ambulatory Visit (INDEPENDENT_AMBULATORY_CARE_PROVIDER_SITE_OTHER): Payer: 59 | Admitting: Family Medicine

## 2016-11-17 VITALS — BP 117/74 | HR 74 | Temp 97.7°F | Ht 66.0 in | Wt 193.0 lb

## 2016-11-17 DIAGNOSIS — G43809 Other migraine, not intractable, without status migrainosus: Secondary | ICD-10-CM

## 2016-11-17 DIAGNOSIS — K219 Gastro-esophageal reflux disease without esophagitis: Secondary | ICD-10-CM | POA: Diagnosis not present

## 2016-11-17 DIAGNOSIS — F419 Anxiety disorder, unspecified: Secondary | ICD-10-CM | POA: Diagnosis not present

## 2016-11-17 DIAGNOSIS — E782 Mixed hyperlipidemia: Secondary | ICD-10-CM | POA: Diagnosis not present

## 2016-11-17 DIAGNOSIS — E538 Deficiency of other specified B group vitamins: Secondary | ICD-10-CM

## 2016-11-17 DIAGNOSIS — E559 Vitamin D deficiency, unspecified: Secondary | ICD-10-CM

## 2016-11-17 MED ORDER — RIZATRIPTAN BENZOATE 10 MG PO TABS: ORAL_TABLET | ORAL | 2 refills | Status: DC

## 2016-11-17 MED ORDER — DIVALPROEX SODIUM 500 MG PO DR TAB: 500.0000 mg | DELAYED_RELEASE_TABLET | Freq: Every day | ORAL | 2 refills | Status: DC

## 2016-11-17 MED ORDER — ATORVASTATIN CALCIUM 40 MG PO TABS: 40.0000 mg | ORAL_TABLET | Freq: Every day | ORAL | 3 refills | Status: DC

## 2016-11-17 MED ORDER — VITAMIN D (ERGOCALCIFEROL) 1.25 MG (50000 UNIT) PO CAPS: 50000.0000 [IU] | ORAL_CAPSULE | ORAL | 3 refills | Status: DC

## 2016-11-17 MED ORDER — ALPRAZOLAM 0.5 MG PO TABS: ORAL_TABLET | ORAL | 2 refills | Status: DC

## 2016-11-17 MED ORDER — OMEPRAZOLE 40 MG PO CPDR: DELAYED_RELEASE_CAPSULE | ORAL | 3 refills | Status: DC

## 2016-11-17 NOTE — Progress Notes (Signed)
Subjective:  Patient ID: Kathy Ewing, female    DOB: [birth year]  Age: 46 y.o. MRN: [id number]  CC: Follow-up (pt here today for routine follow up of her chronic medical condtions )   HPI Kathy Ewing presents for follow up of her migraines. Still having them frequently. HA is frontal, throbbbing. Occurring 1-2 per week. INterfering with daily routine. Notes photophobia. Missing work when they occur. Also overdue for B12 shot and blood level. Additionally has history of low Vit. D. Due for follow up level.  Depression screen [facility] 2/9 11/17/2016 08/05/2016 07/28/2016  Decreased Interest 0 1 1  Down, Depressed, Hopeless 0 0 0  PHQ - 2 Score 0 1 1    History Kathy Ewing has a past medical history of Anxiety, B12 deficiency, Cholelithiasis, GERD (gastroesophageal reflux disease), Headache, High cholesterol, Numbness, Pneumonia, and Vitamin D deficiency.   She has a past surgical history that includes Cesarean section and LAPAROSCOPIC CHOLECYSTECTOMY (N/A, 10/02/2014).   Her family history includes Anxiety disorder in her brother; Cerebral palsy in her daughter; Depression in her daughter; Hypertension in her mother; Migraines in her sister; Multiple sclerosis in her sister.She reports that she has been smoking cigarettes.  She has a 15.00 pack-year smoking history. she has never used smokeless tobacco. She reports that she does not drink alcohol or use drugs.    ROS Review of Systems  Constitutional: Negative for activity change, appetite change and fever.  HENT: Negative for congestion, rhinorrhea and sore throat.   Eyes: Negative for visual disturbance.  Respiratory: Negative for cough and shortness of breath.   Cardiovascular: Negative for chest pain and palpitations.  Gastrointestinal: Negative for abdominal pain, diarrhea and nausea.  Genitourinary: Negative for dysuria.  Musculoskeletal: Negative for arthralgias and myalgias.  Neurological: Positive for headaches.    Objective:    BP 117/74   Pulse 74   Temp 97.7 F (36.5 C) (Oral)   Ht 5' 6"  (1.676 m)   Wt 193 lb (87.5 kg)   BMI 31.15 kg/m   BP Readings from Last 3 Encounters:  11/17/16 117/74  08/05/16 110/73  02/23/16 107/75    Wt Readings from Last 3 Encounters:  11/17/16 193 lb (87.5 kg)  08/05/16 199 lb (90.3 kg)  07/28/16 194 lb (88 kg)     Physical Exam  Constitutional: She is oriented to person, place, and time. She appears well-developed and well-nourished. No distress.  HENT:  Head: Normocephalic and atraumatic.  Right Ear: External ear normal.  Left Ear: External ear normal.  Nose: Nose normal.  Mouth/Throat: Oropharynx is clear and moist.  Eyes: Conjunctivae and EOM are normal. Pupils are equal, round, and reactive to light.  Neck: Normal range of motion. Neck supple. No thyromegaly present.  Cardiovascular: Normal rate, regular rhythm and normal heart sounds.  No murmur heard. Pulmonary/Chest: Effort normal and breath sounds normal. No respiratory distress. She has no wheezes. She has no rales.  Abdominal: Soft. Bowel sounds are normal. She exhibits no distension. There is no tenderness.  Lymphadenopathy:    She has no cervical adenopathy.  Neurological: She is alert and oriented to person, place, and time. She has normal reflexes.  Skin: Skin is warm and dry.  Psychiatric: She has a normal mood and affect. Her behavior is normal. Judgment and thought content normal.      Assessment & Plan:   [name] was seen today for follow-up.  Diagnoses and all orders for this visit:  Other migraine without status migrainosus, not  intractable -     Ambulatory referral to Neurology  Mixed hyperlipidemia -     CBC with Differential/Platelet -     CMP14+EGFR -     Lipid panel  Gastroesophageal reflux disease without esophagitis  Vitamin B 12 deficiency -     Vitamin B12  Vitamin D deficiency -     VITAMIN D 25 Hydroxy (Vit-D Deficiency, Fractures)  Anxiety -     ALPRAZolam  (XANAX) 0.5 MG tablet; take 1 to 2 tablets by mouth twice a day if needed for anxiety  Other orders -     Vitamin D, Ergocalciferol, (DRISDOL) 50000 units CAPS capsule; Take 1 capsule (50,000 Units total) by mouth every 7 (seven) days. -     divalproex (DEPAKOTE) 500 MG DR tablet; Take 1 tablet (500 mg total) by mouth at bedtime. -     rizatriptan (MAXALT) 10 MG tablet; Take 1 tab every day as needed for migraines -     atorvastatin (LIPITOR) 40 MG tablet; Take 1 tablet (40 mg total) by mouth daily. -     omeprazole (PRILOSEC) 40 MG capsule; take 1 capsule by mouth once daily ON EMPTY STOMACH       I have discontinued Kathy Ewing's topiramate. I am also having her start on Vitamin D (Ergocalciferol), divalproex, and rizatriptan. Additionally, I am having her maintain her ALPRAZolam, atorvastatin, and omeprazole. We administered cyanocobalamin. We will continue to administer cyanocobalamin.  Allergies as of 11/17/2016   No Known Allergies     Medication List        Accurate as of 11/17/16 11:59 PM. Always use your most recent med list.          ALPRAZolam 0.5 MG tablet Commonly known as:  XANAX take 1 to 2 tablets by mouth twice a day if needed for anxiety   atorvastatin 40 MG tablet Commonly known as:  LIPITOR Take 1 tablet (40 mg total) by mouth daily.   divalproex 500 MG DR tablet Commonly known as:  DEPAKOTE Take 1 tablet (500 mg total) by mouth at bedtime.   omeprazole 40 MG capsule Commonly known as:  PRILOSEC take 1 capsule by mouth once daily ON EMPTY STOMACH   rizatriptan 10 MG tablet Commonly known as:  MAXALT Take 1 tab every day as needed for migraines   Vitamin D (Ergocalciferol) 50000 units Caps capsule Commonly known as:  DRISDOL Take 1 capsule (50,000 Units total) by mouth every 7 (seven) days.        Follow-up: Return in about 1 month (around 12/17/2016).  Claretta Fraise, M.D.

## 2016-11-18 LAB — CMP14+EGFR
A/G RATIO: 1.9 (ref 1.2–2.2)
ALBUMIN: 4.2 g/dL (ref 3.5–5.5)
ALK PHOS: 91 IU/L (ref 39–117)
ALT: 11 IU/L (ref 0–32)
AST: 15 IU/L (ref 0–40)
BILIRUBIN TOTAL: 0.3 mg/dL (ref 0.0–1.2)
BUN / CREAT RATIO: 11 (ref 9–23)
BUN: 9 mg/dL (ref 6–24)
CHLORIDE: 106 mmol/L (ref 96–106)
CO2: 22 mmol/L (ref 20–29)
Calcium: 8.7 mg/dL (ref 8.7–10.2)
Creatinine, Ser: 0.8 mg/dL (ref 0.57–1.00)
GFR calc non Af Amer: 90 mL/min/{1.73_m2} (ref >59)
GFR, EST AFRICAN AMERICAN: 104 mL/min/{1.73_m2} (ref >59)
GLOBULIN, TOTAL: 2.2 g/dL (ref 1.5–4.5)
Glucose: 69 mg/dL (ref 65–99)
Potassium: 4.2 mmol/L (ref 3.5–5.2)
SODIUM: 143 mmol/L (ref 134–144)
TOTAL PROTEIN: 6.4 g/dL (ref 6.0–8.5)

## 2016-11-18 LAB — CBC WITH DIFFERENTIAL/PLATELET
BASOS ABS: 0.1 10*3/uL (ref 0.0–0.2)
BASOS: 1 %
EOS (ABSOLUTE): 0.4 10*3/uL (ref 0.0–0.4)
Eos: 5 %
Hematocrit: 36.5 % (ref 34.0–46.6)
Hemoglobin: 11.6 g/dL (ref 11.1–15.9)
Immature Grans (Abs): 0 10*3/uL (ref 0.0–0.1)
Immature Granulocytes: 0 %
LYMPHS ABS: 1.9 10*3/uL (ref 0.7–3.1)
Lymphs: 26 %
MCH: 26.1 pg — AB (ref 26.6–33.0)
MCHC: 31.8 g/dL (ref 31.5–35.7)
MCV: 82 fL (ref 79–97)
MONOCYTES: 10 %
Monocytes Absolute: 0.8 10*3/uL (ref 0.1–0.9)
NEUTROS ABS: 4.3 10*3/uL (ref 1.4–7.0)
Neutrophils: 58 %
Platelets: 421 10*3/uL — ABNORMAL HIGH (ref 150–379)
RBC: 4.44 x10E6/uL (ref 3.77–5.28)
RDW: 16.8 % — ABNORMAL HIGH (ref 12.3–15.4)
WBC: 7.5 10*3/uL (ref 3.4–10.8)

## 2016-11-18 LAB — LIPID PANEL
CHOL/HDL RATIO: 4 ratio (ref 0.0–4.4)
Cholesterol, Total: 184 mg/dL (ref 100–199)
HDL: 46 mg/dL (ref >39)
LDL Calculated: 116 mg/dL — ABNORMAL HIGH (ref 0–99)
Triglycerides: 109 mg/dL (ref 0–149)
VLDL CHOLESTEROL CAL: 22 mg/dL (ref 5–40)

## 2016-11-18 LAB — VITAMIN B12: Vitamin B-12: 434 pg/mL (ref 232–1245)

## 2016-11-18 LAB — VITAMIN D 25 HYDROXY (VIT D DEFICIENCY, FRACTURES): Vit D, 25-Hydroxy: 22.4 ng/mL — ABNORMAL LOW (ref 30.0–100.0)

## 2016-11-22 ENCOUNTER — Telehealth: Payer: Self-pay | Admitting: Family Medicine

## 2016-11-22 NOTE — Telephone Encounter (Signed)
Patient aware of results and patient aware that referral was faxed to Dr. Trula Ore in [facility]

## 2016-12-15 ENCOUNTER — Encounter: Payer: Self-pay | Admitting: Family Medicine

## 2016-12-15 ENCOUNTER — Encounter (INDEPENDENT_AMBULATORY_CARE_PROVIDER_SITE_OTHER): Payer: 59 | Admitting: Family Medicine

## 2016-12-15 DIAGNOSIS — Z23 Encounter for immunization: Secondary | ICD-10-CM | POA: Diagnosis not present

## 2016-12-15 NOTE — Progress Notes (Deleted)
   Subjective:  Patient ID: Kathy Ewing, female    DOB: 1970-04-06  Age: 46 y.o. MRN: [id number]  CC: Follow-up (pt here today following up for migraines and she has seen neurologist already.)   HPI AFTIN LYE presents for ***  Depression screen [facility] 2/9 12/15/2016 11/17/2016 08/05/2016  Decreased Interest 0 0 1  Down, Depressed, Hopeless 0 0 0  PHQ - 2 Score 0 0 1    History Kathy Ewing has a past medical history of Anxiety, B12 deficiency, Cholelithiasis, GERD (gastroesophageal reflux disease), Headache, High cholesterol, Numbness, Pneumonia, and Vitamin D deficiency.   She has a past surgical history that includes Cesarean section and Cholecystectomy (N/A, 10/02/2014).   Her family history includes Anxiety disorder in her brother; Cerebral palsy in her daughter; Depression in her daughter; Hypertension in her mother; Migraines in her sister; Multiple sclerosis in her sister.She reports that she has been smoking cigarettes.  She has a 15.00 pack-year smoking history. she has never used smokeless tobacco. She reports that she does not drink alcohol or use drugs.    ROS Review of Systems  Objective:  BP 109/75   Pulse 71   Temp (!) 96.9 F (36.1 C) (Oral)   Ht 5\' 6"  (1.676 m)   Wt 200 lb (90.7 kg)   BMI 32.28 kg/m   BP Readings from Last 3 Encounters:  12/15/16 109/75  11/17/16 117/74  08/05/16 110/73    Wt Readings from Last 3 Encounters:  12/15/16 200 lb (90.7 kg)  11/17/16 193 lb (87.5 kg)  08/05/16 199 lb (90.3 kg)     Physical Exam    Assessment & Plan:   [name] was seen today for follow-up.  Diagnoses and all orders for this visit:  Need for immunization against influenza -     Flu Vaccine QUAD 36+ mos IM       I am having Daylani D. Berline Lopes maintain her Vitamin D (Ergocalciferol), divalproex, rizatriptan, ALPRAZolam, atorvastatin, omeprazole, lamoTRIgine, and EMGALITY. We will continue to administer cyanocobalamin.  Allergies as of 12/15/2016    No Known Allergies     Medication List        Accurate as of 12/15/16  4:03 PM. Always use your most recent med list.          ALPRAZolam 0.5 MG tablet Commonly known as:  XANAX take 1 to 2 tablets by mouth twice a day if needed for anxiety   atorvastatin 40 MG tablet Commonly known as:  LIPITOR Take 1 tablet (40 mg total) by mouth daily.   divalproex 500 MG DR tablet Commonly known as:  DEPAKOTE Take 1 tablet (500 mg total) by mouth at bedtime.   EMGALITY 120 MG/ML Soaj Generic drug:  Galcanezumab-gnlm   lamoTRIgine 25 MG tablet Commonly known as:  LAMICTAL take 1 tablet by mouth every evening for 1 week then 1 tablet by ...  (REFER TO PRESCRIPTION NOTES).   omeprazole 40 MG capsule Commonly known as:  PRILOSEC take 1 capsule by mouth once daily ON EMPTY STOMACH   rizatriptan 10 MG tablet Commonly known as:  MAXALT Take 1 tab every day as needed for migraines   Vitamin D (Ergocalciferol) 50000 units Caps capsule Commonly known as:  DRISDOL Take 1 capsule (50,000 Units total) by mouth every 7 (seven) days.        Follow-up: No Follow-up on file.  Claretta Fraise, M.D.

## 2016-12-15 NOTE — Progress Notes (Signed)
Patient was seen very briefly today in error.  This visit was meant to be canceled.  She was only meant to be seen if she had not been able to schedule with neurology prior to this time.  Since she is under the care of Dr. Dolores Patty and he is actively managing her migraines today's visit was canceled.

## 2016-12-27 ENCOUNTER — Encounter: Payer: 59 | Admitting: Family Medicine

## 2017-01-06 ENCOUNTER — Ambulatory Visit (INDEPENDENT_AMBULATORY_CARE_PROVIDER_SITE_OTHER): Payer: 59 | Admitting: Family Medicine

## 2017-01-06 ENCOUNTER — Encounter: Payer: Self-pay | Admitting: Family Medicine

## 2017-01-06 VITALS — BP 113/74 | HR 72 | Temp 97.2°F | Ht 66.0 in | Wt 200.0 lb

## 2017-01-06 DIAGNOSIS — G43809 Other migraine, not intractable, without status migrainosus: Secondary | ICD-10-CM

## 2017-01-06 NOTE — Progress Notes (Signed)
Subjective:  Patient ID: Kathy Ewing, female    DOB: Feb 29, 1970  Age: 46 y.o. MRN: [id number]  CC: Annual Exam (pt here today for CPE)   HPI Kathy Ewing presents for Just needs basic biometrics for insurance. HA persists. Meds helping. Sees HA specialist 1 month from now.  Depression screen [facility] 2/9 01/06/2017 12/15/2016 11/17/2016  Decreased Interest 0 0 0  Down, Depressed, Hopeless 0 0 0  PHQ - 2 Score 0 0 0    History Kathy Ewing has a past medical history of Anxiety, B12 deficiency, Cholelithiasis, GERD (gastroesophageal reflux disease), Headache, High cholesterol, Numbness, Pneumonia, and Vitamin D deficiency.   Kathy Ewing has a past surgical history that includes Cesarean section and Cholecystectomy (N/A, 10/02/2014).   Her family history includes Anxiety disorder in her brother; Cerebral palsy in her daughter; Depression in her daughter; Hypertension in her mother; Migraines in her sister; Multiple sclerosis in her sister.Kathy Ewing reports that Kathy Ewing has been smoking cigarettes.  Kathy Ewing has a 15.00 pack-year smoking history. Kathy Ewing has never used smokeless tobacco. Kathy Ewing reports that Kathy Ewing does not drink alcohol or use drugs.    ROS Review of Systems Not performed Objective:  BP 113/74   Pulse 72   Temp (!) 97.2 F (36.2 C) (Oral)   Ht 5\' 6"  (1.676 m)   Wt 200 lb (90.7 kg)   BMI 32.28 kg/m   BP Readings from Last 3 Encounters:  01/06/17 113/74  12/15/16 109/75  11/17/16 117/74    Wt Readings from Last 3 Encounters:  01/06/17 200 lb (90.7 kg)  12/15/16 200 lb (90.7 kg)  11/17/16 193 lb (87.5 kg)     Physical Exam  Constitutional: Kathy Ewing is oriented to person, place, and time. Kathy Ewing appears well-developed and well-nourished. No distress.  Cardiovascular: Normal rate and regular rhythm.  Pulmonary/Chest: Breath sounds normal.  Neurological: Kathy Ewing is alert and oriented to person, place, and time.  Skin: Skin is warm and dry.  Psychiatric: Kathy Ewing has a normal mood and affect.       Assessment & Plan:   Kathy Ewing was seen today for annual exam.  Diagnoses and all orders for this visit:  Other migraine without status migrainosus, not intractable       I am having Kathy Ewing maintain her Vitamin D (Ergocalciferol), divalproex, rizatriptan, ALPRAZolam, atorvastatin, omeprazole, lamoTRIgine, and EMGALITY. We will continue to administer cyanocobalamin.  Allergies as of 01/06/2017   No Known Allergies     Medication List        Accurate as of 01/06/17 11:50 AM. Always use your most recent med list.          ALPRAZolam 0.5 MG tablet Commonly known as:  XANAX take 1 to 2 tablets by mouth twice a day if needed for anxiety   atorvastatin 40 MG tablet Commonly known as:  LIPITOR Take 1 tablet (40 mg total) by mouth daily.   divalproex 500 MG DR tablet Commonly known as:  DEPAKOTE Take 1 tablet (500 mg total) by mouth at bedtime.   EMGALITY 120 MG/ML Soaj Generic drug:  Galcanezumab-gnlm   lamoTRIgine 25 MG tablet Commonly known as:  LAMICTAL take 1 tablet by mouth every evening for 1 week then 1 tablet by ...  (REFER TO PRESCRIPTION NOTES).   omeprazole 40 MG capsule Commonly known as:  PRILOSEC take 1 capsule by mouth once daily ON EMPTY STOMACH   rizatriptan 10 MG tablet Commonly known as:  MAXALT Take 1 tab every day as needed for  migraines   Vitamin D (Ergocalciferol) 50000 units Caps capsule Commonly known as:  DRISDOL Take 1 capsule (50,000 Units total) by mouth every 7 (seven) days.      Todays visit did not include a true CPE.  Follow-up: as needsd  Claretta Fraise, M.D.

## 2017-02-07 ENCOUNTER — Ambulatory Visit (INDEPENDENT_AMBULATORY_CARE_PROVIDER_SITE_OTHER): Payer: 59 | Admitting: Family Medicine

## 2017-02-07 ENCOUNTER — Encounter: Payer: Self-pay | Admitting: Family Medicine

## 2017-02-07 VITALS — BP 114/73 | HR 75 | Temp 97.0°F | Ht 66.0 in | Wt 200.0 lb

## 2017-02-07 DIAGNOSIS — M542 Cervicalgia: Secondary | ICD-10-CM

## 2017-02-07 DIAGNOSIS — F419 Anxiety disorder, unspecified: Secondary | ICD-10-CM

## 2017-02-07 MED ORDER — DIVALPROEX SODIUM 500 MG PO DR TAB: 500.0000 mg | DELAYED_RELEASE_TABLET | Freq: Every day | ORAL | 1 refills | Status: DC

## 2017-02-07 MED ORDER — RIZATRIPTAN BENZOATE 10 MG PO TABS: ORAL_TABLET | ORAL | 2 refills | Status: DC

## 2017-02-07 MED ORDER — ATORVASTATIN CALCIUM 40 MG PO TABS: 40.0000 mg | ORAL_TABLET | Freq: Every day | ORAL | 1 refills | Status: DC

## 2017-02-07 MED ORDER — PANTOPRAZOLE SODIUM 40 MG PO TBEC: 40.0000 mg | DELAYED_RELEASE_TABLET | Freq: Every day | ORAL | 1 refills | Status: DC

## 2017-02-07 MED ORDER — ALPRAZOLAM 0.5 MG PO TABS: ORAL_TABLET | ORAL | 5 refills | Status: DC

## 2017-02-07 MED ORDER — ONDANSETRON HCL 4 MG PO TABS: 4.0000 mg | ORAL_TABLET | Freq: Three times a day (TID) | ORAL | 0 refills | Status: DC | PRN

## 2017-02-07 NOTE — Progress Notes (Signed)
Subjective:  Patient ID: Kathy Ewing, female    DOB: [birth year]  Age: 47 y.o. MRN: [id number]  CC: Follow-up (pt was scheduled initially for follow up of her back pain but pt saw neurology yesterday and was given multiple steroid injections in neck due to pinched nerves which they told her was the cause of her back pain. Pt is having a reaction to the "steroid injections" she believes.)   HPI Kathy Ewing presents for recheck of back pain.  However she had multiple injections into the posterior neck yesterday.  The medicine seems to be reacting to her her face is red and flushed she has increased pain at the back of her neck and she has been nauseous and threw up twice earlier this morning.  She was told that they were pinched nerves and that the steroids would help relieve the low sensations.  However it could take 2-3 days to get relief.  She also has been using more of her anxiety medicine because of the holidays and other circumstances.  Try to get a refill on  Xanax a couple days early since she had been through her supply.  She is due for refills at this time.  PHQ noted below. Depression screen [facility] 2/9 02/07/2017 01/06/2017 12/15/2016  Decreased Interest 0 0 0  Down, Depressed, Hopeless 0 0 0  PHQ - 2 Score 0 0 0      History Kathy Ewing has a past medical history of Anxiety, B12 deficiency, Cholelithiasis, GERD (gastroesophageal reflux disease), Headache, High cholesterol, Numbness, Pneumonia, and Vitamin D deficiency.   She has a past surgical history that includes Cesarean section and Cholecystectomy (N/A, 10/02/2014).   Her family history includes Anxiety disorder in her brother; Cerebral palsy in her daughter; Depression in her daughter; Hypertension in her mother; Migraines in her sister; Multiple sclerosis in her sister.She reports that she has been smoking cigarettes.  She has a 15.00 pack-year smoking history. she has never used smokeless tobacco. She reports that she does  not drink alcohol or use drugs.    ROS Review of Systems  Constitutional: Negative for activity change, appetite change and fever.  HENT: Negative for congestion, rhinorrhea and sore throat.   Eyes: Negative for visual disturbance.  Respiratory: Negative for cough and shortness of breath.   Cardiovascular: Negative for chest pain and palpitations.  Gastrointestinal: Negative for abdominal pain, diarrhea and nausea.  Genitourinary: Negative for dysuria.  Musculoskeletal: Negative for arthralgias and myalgias.    Objective:  BP 114/73   Pulse 75   Temp (!) 97 F (36.1 C) (Oral)   Ht 5\' 6"  (1.676 m)   Wt 200 lb (90.7 kg)   BMI 32.28 kg/m   BP Readings from Last 3 Encounters:  02/07/17 114/73  01/06/17 113/74  12/15/16 109/75    Wt Readings from Last 3 Encounters:  02/07/17 200 lb (90.7 kg)  01/06/17 200 lb (90.7 kg)  12/15/16 200 lb (90.7 kg)     Physical Exam  Constitutional: She is oriented to person, place, and time. She appears well-developed and well-nourished. No distress.  HENT:  Head: Normocephalic and atraumatic.  Right Ear: External ear normal.  Left Ear: External ear normal.  Nose: Nose normal.  Mouth/Throat: Oropharynx is clear and moist.  Eyes: Conjunctivae and EOM are normal. Pupils are equal, round, and reactive to light.  Neck: Normal range of motion. Neck supple. No thyromegaly present.  Cardiovascular: Normal rate, regular rhythm and normal heart sounds.  No murmur  heard. Pulmonary/Chest: Effort normal and breath sounds normal. No respiratory distress. She has no wheezes. She has no rales.  Abdominal: Soft. Bowel sounds are normal. She exhibits no distension. There is no tenderness.  Musculoskeletal: She exhibits tenderness (Tender at posterior neck.  Mild redness also from applying heat noted at the posterior neck.).  Lymphadenopathy:    She has no cervical adenopathy.  Neurological: She is alert and oriented to person, place, and time. She has  normal reflexes.  Skin: Skin is warm and dry.  Psychiatric: She has a normal mood and affect. Her behavior is normal. Judgment and thought content normal.      Assessment & Plan:   Kathy Ewing was seen today for follow-up.  Diagnoses and all orders for this visit:  Neck pain  Anxiety -     ALPRAZolam (XANAX) 0.5 MG tablet; take 1 to 2 tablets by mouth twice a day if needed for anxiety  Other orders -     atorvastatin (LIPITOR) 40 MG tablet; Take 1 tablet (40 mg total) by mouth daily. -     divalproex (DEPAKOTE) 500 MG DR tablet; Take 1 tablet (500 mg total) by mouth at bedtime. -     rizatriptan (MAXALT) 10 MG tablet; Take 1 tab every day as needed for migraines -     pantoprazole (PROTONIX) 40 MG tablet; Take 1 tablet (40 mg total) by mouth daily. -     ondansetron (ZOFRAN) 4 MG tablet; Take 1 tablet (4 mg total) by mouth every 8 (eight) hours as needed for nausea or vomiting.       I have discontinued Kathy Ewing's omeprazole. I am also having her start on pantoprazole and ondansetron. Additionally, I am having her maintain her Vitamin D (Ergocalciferol), lamoTRIgine, EMGALITY, atorvastatin, divalproex, rizatriptan, and ALPRAZolam. We will continue to administer cyanocobalamin.  Allergies as of 02/07/2017   No Known Allergies     Medication List        Accurate as of 02/07/17  6:07 PM. Always use your most recent med list.          ALPRAZolam 0.5 MG tablet Commonly known as:  XANAX take 1 to 2 tablets by mouth twice a day if needed for anxiety   atorvastatin 40 MG tablet Commonly known as:  LIPITOR Take 1 tablet (40 mg total) by mouth daily.   divalproex 500 MG DR tablet Commonly known as:  DEPAKOTE Take 1 tablet (500 mg total) by mouth at bedtime.   EMGALITY 120 MG/ML Soaj Generic drug:  Galcanezumab-gnlm   lamoTRIgine 25 MG tablet Commonly known as:  LAMICTAL take 1 tablet by mouth every evening for 1 week then 1 tablet by ...  (REFER TO PRESCRIPTION  NOTES).   ondansetron 4 MG tablet Commonly known as:  ZOFRAN Take 1 tablet (4 mg total) by mouth every 8 (eight) hours as needed for nausea or vomiting.   pantoprazole 40 MG tablet Commonly known as:  PROTONIX Take 1 tablet (40 mg total) by mouth daily.   rizatriptan 10 MG tablet Commonly known as:  MAXALT Take 1 tab every day as needed for migraines   Vitamin D (Ergocalciferol) 50000 units Caps capsule Commonly known as:  DRISDOL Take 1 capsule (50,000 Units total) by mouth every 7 (seven) days.        Follow-up: Return in about 6 months (around 08/07/2017).  Claretta Fraise, M.D.

## 2017-02-22 DIAGNOSIS — Z0289 Encounter for other administrative examinations: Secondary | ICD-10-CM

## 2017-05-08 ENCOUNTER — Ambulatory Visit: Payer: 59 | Admitting: Family Medicine

## 2017-05-17 ENCOUNTER — Ambulatory Visit (INDEPENDENT_AMBULATORY_CARE_PROVIDER_SITE_OTHER): Payer: 59 | Admitting: Family Medicine

## 2017-05-17 ENCOUNTER — Encounter: Payer: Self-pay | Admitting: Family Medicine

## 2017-05-17 VITALS — BP 103/66 | HR 76 | Temp 98.2°F | Ht 66.0 in | Wt 203.2 lb

## 2017-05-17 DIAGNOSIS — E782 Mixed hyperlipidemia: Secondary | ICD-10-CM

## 2017-05-17 DIAGNOSIS — F419 Anxiety disorder, unspecified: Secondary | ICD-10-CM

## 2017-05-17 DIAGNOSIS — Z23 Encounter for immunization: Secondary | ICD-10-CM | POA: Diagnosis not present

## 2017-05-17 DIAGNOSIS — G43809 Other migraine, not intractable, without status migrainosus: Secondary | ICD-10-CM | POA: Diagnosis not present

## 2017-05-17 DIAGNOSIS — E538 Deficiency of other specified B group vitamins: Secondary | ICD-10-CM | POA: Diagnosis not present

## 2017-05-17 DIAGNOSIS — K219 Gastro-esophageal reflux disease without esophagitis: Secondary | ICD-10-CM

## 2017-05-17 MED ORDER — VITAMIN D (ERGOCALCIFEROL) 1.25 MG (50000 UNIT) PO CAPS: 50000.0000 [IU] | ORAL_CAPSULE | ORAL | 3 refills | Status: DC

## 2017-05-17 NOTE — Progress Notes (Signed)
Subjective:  Patient ID: Kathy Ewing, female    DOB: 09-30-1970  Age: 47 y.o. MRN: [id number]  CC: Follow-up (pt here today for her 3 month follow up of chronic medical conditions)   HPI Kathy Ewing presents for follow-up of elevated cholesterol. Doing well without complaints on current medication. Denies side effects of statin including myalgia and arthralgia and nausea. Also in today for liver function testing. Currently no chest pain, shortness of breath or other cardiovascular related symptoms noted.  She has stopped smoking and has been tapering off of nicotine through vaping.  She is now on the lowest dose and plans on discontinuing the vape soon.  She has been controlling her migraines by using CBD oil in the vapes.  She is having fewer migraines then she has had a long long time.  She says she has had 2 or 3 in the last 2 months.  Sometimes they last a couple of days and are only relieved by laying down in a quiet room.  They can be moderately severe.  She says she is tried everything in the past and she used to have 2-3 a week sometimes.  She would like to have a renewal of Emgality for migraine treatment.  Patient also now has both her parents and her home.  She is been having to care for them due to her mother's illness.  Her mother is 21 and her father is 76 years old.  She says sometimes they will argue back and forth but then they will be very pleasant with each other.  Sometimes it is just stressful to have to take care of them.  She says their parents and they take care of her so she is going to take care of them.   Patient also has a history of vitamin B12 deficiency.  She is due for evaluation of that as well as her next injection.  She has not been injected in quite some time.  Additionally she is overdue  for a tetanus vaccine.    Office Visit from 05/17/2017 in [place]  PHQ-2 Total Score  1      GAD 7 : Generalized Anxiety Score 05/17/2017  03/31/2015 01/14/2015  Nervous, Anxious, on Edge _0 Control/stop worrying _1 Worry too much - different things _2 Trouble relaxing _3 Restless 3 1 0  Easily annoyed or irritable 1 2 0  Afraid - awful might happen 3 3 0  Total GAD 7 Score _4 Anxiety Difficulty Somewhat difficult Very difficult Not difficult at all      History Kathy Ewing has a past medical history of Anxiety, B12 deficiency, Cholelithiasis, GERD (gastroesophageal reflux disease), Headache, High cholesterol, Numbness, Pneumonia, and Vitamin D deficiency.   She has a past surgical history that includes Cesarean section and Cholecystectomy (N/A, 10/02/2014).   Her family history includes Anxiety disorder in her brother; Cerebral palsy in her daughter; Depression in her daughter; Hypertension in her mother; Migraines in her sister; Multiple sclerosis in her sister.She reports that she has been smoking cigarettes.  She has a 15.00 pack-year smoking history. She has never used smokeless tobacco. She reports that she does not drink alcohol or use drugs.  Current Outpatient Medications on File Prior to Visit  Medication Sig Dispense Refill  . ALPRAZolam (XANAX) 0.5 MG tablet take 1 to 2 tablets by mouth twice a day if needed for anxiety 60  tablet 5  . atorvastatin (LIPITOR) 40 MG tablet Take 1 tablet (40 mg total) by mouth daily. 90 tablet 1  . ondansetron (ZOFRAN) 4 MG tablet Take 1 tablet (4 mg total) by mouth every 8 (eight) hours as needed for nausea or vomiting. 12 tablet 0  . pantoprazole (PROTONIX) 40 MG tablet Take 1 tablet (40 mg total) by mouth daily. 90 tablet 1   Current Facility-Administered Medications on File Prior to Visit  Medication Dose Route Frequency Provider Last Rate Last Dose  . cyanocobalamin ((VITAMIN B-12)) injection 1,000 mcg  1,000 mcg Intramuscular Q30 days Claretta Fraise, MD   1,000 mcg at 11/17/16 1656    ROS Review of Systems  Constitutional: Negative.  Negative for activity  change, appetite change and fever.  HENT: Negative.   Eyes: Positive for photophobia (With migraine). Negative for visual disturbance.  Respiratory: Negative for shortness of breath.   Cardiovascular: Negative for chest pain.  Gastrointestinal: Negative for abdominal pain.  Musculoskeletal: Negative for arthralgias.  Neurological: Positive for light-headedness (Recent episode) and headaches.  Psychiatric/Behavioral: Positive for dysphoric mood. The patient is nervous/anxious.     Objective:  BP 103/66   Pulse 76   Temp 98.2 F (36.8 C) (Oral)   Ht 5' 6" (1.676 m)   Wt 203 lb 4 oz (92.2 kg)   BMI 32.81 kg/m   BP Readings from Last 3 Encounters:  05/17/17 103/66  02/07/17 114/73  01/06/17 113/74    Wt Readings from Last 3 Encounters:  05/17/17 203 lb 4 oz (92.2 kg)  02/07/17 200 lb (90.7 kg)  01/06/17 200 lb (90.7 kg)     Physical Exam  Constitutional: She is oriented to person, place, and time. She appears well-developed and well-nourished. No distress.  HENT:  Head: Normocephalic and atraumatic.  Right Ear: External ear normal.  Left Ear: External ear normal.  Nose: Nose normal.  Mouth/Throat: Oropharynx is clear and moist.  Eyes: Pupils are equal, round, and reactive to light. Conjunctivae and EOM are normal.  Neck: Normal range of motion. Neck supple. No thyromegaly present.  Cardiovascular: Normal rate, regular rhythm and normal heart sounds.  No murmur heard. Pulmonary/Chest: Effort normal and breath sounds normal. No respiratory distress. She has no wheezes. She has no rales.  Abdominal: Soft. Bowel sounds are normal. She exhibits no distension. There is no tenderness.  Lymphadenopathy:    She has no cervical adenopathy.  Neurological: She is alert and oriented to person, place, and time. She has normal reflexes.  Skin: Skin is warm and dry.  Psychiatric: She has a normal mood and affect. Her behavior is normal. Judgment and thought content normal.    No  results found for: HGBA1C  Lab Results  Component Value Date   WBC 7.5 11/17/2016   HGB 11.6 11/17/2016   HCT 36.5 11/17/2016   PLT 421 (H) 11/17/2016   GLUCOSE 69 11/17/2016   CHOL 184 11/17/2016   TRIG 109 11/17/2016   HDL 46 11/17/2016   LDLCALC 116 (H) 11/17/2016   ALT 11 11/17/2016   AST 15 11/17/2016   NA 143 11/17/2016   K 4.2 11/17/2016   CL 106 11/17/2016   CREATININE 0.80 11/17/2016   BUN 9 11/17/2016   CO2 22 11/17/2016   TSH 4.220 01/04/2016    Ct Abdomen Pelvis W Contrast  Result Date: 10/28/2014 CLINICAL DATA:  Lower abdominal pain, nausea. Prior cholecystectomy 10/02/2014. EXAM: CT ABDOMEN AND PELVIS WITH CONTRAST TECHNIQUE: Multidetector CT imaging of the abdomen and  pelvis was performed using the standard protocol following bolus administration of intravenous contrast. CONTRAST:  1 OMNIPAQUE IOHEXOL 300 MG/ML SOLN, 158m ISOVUE-300 IOPAMIDOL (ISOVUE-300) INJECTION 61% COMPARISON:  Ultrasound 09/15/2014 FINDINGS: Lung bases are clear.  No effusions.  Heart is normal size. Small cysts scattered throughout the liver which appear benign. Prior cholecystectomy. Spleen, pancreas, adrenals and kidneys are normal. Bowel grossly unremarkable. No free fluid, free air, or adenopathy. Appendix is visualized and is normal. Uterus, adnexae and urinary bladder normal. No acute bony abnormality or focal bone lesion. IMPRESSION: No acute findings in the abdomen or pelvis. No complicating feature from recent cholecystectomy. Scattered small benign-appearing hepatic cysts. Electronically Signed   By: KRolm BaptiseM.D.   On: 10/28/2014 10:37    Assessment & Plan:   MJaraewas seen today for follow-up.  Diagnoses and all orders for this visit:  Other migraine without status migrainosus, not intractable  Gastroesophageal reflux disease without esophagitis  Mixed hyperlipidemia -     CBC with Differential/Platelet -     CMP14+EGFR -     Lipid panel  Anxiety  Vitamin B 12  deficiency -     Vitamin B12  Other orders -     Tdap vaccine greater than or equal to 7yo IM -     Vitamin D, Ergocalciferol, (DRISDOL) 50000 units CAPS capsule; Take 1 capsule (50,000 Units total) by mouth every 7 (seven) days.   I have discontinued Konnie D. Samad's lamoTRIgine, EMGALITY, divalproex, and rizatriptan. I am also having her maintain her atorvastatin, pantoprazole, ondansetron, ALPRAZolam, and Vitamin D (Ergocalciferol). We will continue to administer cyanocobalamin.  Meds ordered this encounter  Medications  . Vitamin D, Ergocalciferol, (DRISDOL) 50000 units CAPS capsule    Sig: Take 1 capsule (50,000 Units total) by mouth every 7 (seven) days.    Dispense:  13 capsule    Refill:  3     Follow-up: Return in about 3 months (around 08/17/2017).  WClaretta Fraise M.D.

## 2017-05-17 NOTE — Patient Instructions (Signed)

## 2017-05-18 LAB — CBC WITH DIFFERENTIAL/PLATELET
BASOS ABS: 0.1 10*3/uL (ref 0.0–0.2)
Basos: 1 %
EOS (ABSOLUTE): 0.5 10*3/uL — AB (ref 0.0–0.4)
Eos: 6 %
Hematocrit: 37.8 % (ref 34.0–46.6)
Hemoglobin: 12.6 g/dL (ref 11.1–15.9)
IMMATURE GRANS (ABS): 0.1 10*3/uL (ref 0.0–0.1)
Immature Granulocytes: 1 %
LYMPHS: 22 %
Lymphocytes Absolute: 2 10*3/uL (ref 0.7–3.1)
MCH: 27.1 pg (ref 26.6–33.0)
MCHC: 33.3 g/dL (ref 31.5–35.7)
MCV: 81 fL (ref 79–97)
Monocytes Absolute: 0.8 10*3/uL (ref 0.1–0.9)
Monocytes: 10 %
NEUTROS PCT: 60 %
Neutrophils Absolute: 5.4 10*3/uL (ref 1.4–7.0)
PLATELETS: 354 10*3/uL (ref 150–379)
RBC: 4.65 x10E6/uL (ref 3.77–5.28)
RDW: 15.7 % — ABNORMAL HIGH (ref 12.3–15.4)
WBC: 8.8 10*3/uL (ref 3.4–10.8)

## 2017-05-18 LAB — CMP14+EGFR
ALT: 18 IU/L (ref 0–32)
AST: 17 IU/L (ref 0–40)
Albumin/Globulin Ratio: 1.6 (ref 1.2–2.2)
Albumin: 4.1 g/dL (ref 3.5–5.5)
Alkaline Phosphatase: 94 IU/L (ref 39–117)
BUN/Creatinine Ratio: 9 (ref 9–23)
BUN: 8 mg/dL (ref 6–24)
Bilirubin Total: 0.4 mg/dL (ref 0.0–1.2)
CALCIUM: 8.8 mg/dL (ref 8.7–10.2)
CHLORIDE: 106 mmol/L (ref 96–106)
CO2: 22 mmol/L (ref 20–29)
Creatinine, Ser: 0.88 mg/dL (ref 0.57–1.00)
GFR calc non Af Amer: 80 mL/min/{1.73_m2} (ref >59)
GFR, EST AFRICAN AMERICAN: 92 mL/min/{1.73_m2} (ref >59)
Globulin, Total: 2.5 g/dL (ref 1.5–4.5)
Glucose: 70 mg/dL (ref 65–99)
Potassium: 4.6 mmol/L (ref 3.5–5.2)
Sodium: 140 mmol/L (ref 134–144)
TOTAL PROTEIN: 6.6 g/dL (ref 6.0–8.5)

## 2017-05-18 LAB — VITAMIN B12: VITAMIN B 12: 464 pg/mL (ref 232–1245)

## 2017-05-18 LAB — LIPID PANEL
CHOL/HDL RATIO: 2.9 ratio (ref 0.0–4.4)
Cholesterol, Total: 163 mg/dL (ref 100–199)
HDL: 56 mg/dL (ref >39)
LDL Calculated: 85 mg/dL (ref 0–99)
Triglycerides: 108 mg/dL (ref 0–149)
VLDL CHOLESTEROL CAL: 22 mg/dL (ref 5–40)

## 2017-05-19 ENCOUNTER — Other Ambulatory Visit: Payer: Self-pay | Admitting: Family Medicine

## 2017-05-19 NOTE — Telephone Encounter (Signed)
Last seen 05/17/17  Maxalt not on list

## 2017-06-22 ENCOUNTER — Other Ambulatory Visit: Payer: Self-pay | Admitting: Family Medicine

## 2017-06-30 ENCOUNTER — Other Ambulatory Visit: Payer: Self-pay | Admitting: Family Medicine

## 2017-07-26 ENCOUNTER — Ambulatory Visit (INDEPENDENT_AMBULATORY_CARE_PROVIDER_SITE_OTHER): Payer: 59 | Admitting: Family Medicine

## 2017-07-26 ENCOUNTER — Encounter: Payer: Self-pay | Admitting: Family Medicine

## 2017-07-26 VITALS — BP 116/68 | HR 71 | Temp 97.4°F | Ht 66.0 in | Wt 207.2 lb

## 2017-07-26 DIAGNOSIS — W57XXXA Bitten or stung by nonvenomous insect and other nonvenomous arthropods, initial encounter: Secondary | ICD-10-CM | POA: Diagnosis not present

## 2017-07-26 DIAGNOSIS — G43809 Other migraine, not intractable, without status migrainosus: Secondary | ICD-10-CM

## 2017-07-26 MED ORDER — BETAMETHASONE SOD PHOS & ACET 6 (3-3) MG/ML IJ SUSP
6.0000 mg | Freq: Once | INTRAMUSCULAR | Status: AC
  Administered 2017-07-26: 6 mg via INTRAMUSCULAR

## 2017-07-26 MED ORDER — KETOROLAC TROMETHAMINE 60 MG/2ML IM SOLN
60.0000 mg | Freq: Once | INTRAMUSCULAR | Status: AC
  Administered 2017-07-26: 60 mg via INTRAMUSCULAR

## 2017-07-26 MED ORDER — DOXYCYCLINE HYCLATE 100 MG PO TABS: 100.0000 mg | ORAL_TABLET | Freq: Two times a day (BID) | ORAL | 0 refills | Status: DC

## 2017-07-26 MED ORDER — ONDANSETRON 8 MG PO TBDP: 8.0000 mg | ORAL_TABLET | Freq: Three times a day (TID) | ORAL | 0 refills | Status: DC | PRN

## 2017-07-26 NOTE — Progress Notes (Signed)
Subjective:  Patient ID: Kathy Ewing, female    DOB: 1970/11/16  Age: 47 y.o. MRN: [id number]  CC: Migraine   HPI DAISSY Ewing presents for 2 days of severe headache pain she describes it as a throbbing.  She has had a vomiting on 2 occasions.  It followed shortly after being bit by a tick 2 days ago.  She has a history of migraines in the past.  This is similar but somewhat different in character difficult for her to explain.  She has not tried the Maxalt for this.  She has had no fever chills or sweats.  There has been no rash.  There is no exposure over 48 hours noted.  The tick was the size of a dog tick not a deer tick.  Depression screen [facility] 2/9 07/26/2017 05/17/2017 02/07/2017  Decreased Interest 1 1 0  Down, Depressed, Hopeless 0 0 0  PHQ - 2 Score 1 1 0    History Quinlynn has a past medical history of Anxiety, B12 deficiency, Cholelithiasis, GERD (gastroesophageal reflux disease), Headache, High cholesterol, Numbness, Pneumonia, and Vitamin D deficiency.   She has a past surgical history that includes Cesarean section and Cholecystectomy (N/A, 10/02/2014).   Her family history includes Anxiety disorder in her brother; Cerebral palsy in her daughter; Depression in her daughter; Hypertension in her mother; Migraines in her sister; Multiple sclerosis in her sister.She reports that she has been smoking cigarettes.  She has a 15.00 pack-year smoking history. She has never used smokeless tobacco. She reports that she does not drink alcohol or use drugs.    ROS Review of Systems  Constitutional: Negative.   HENT: Negative.   Eyes: Negative for visual disturbance.  Respiratory: Negative for shortness of breath.   Cardiovascular: Negative for chest pain.  Gastrointestinal: Positive for nausea and vomiting. Negative for abdominal pain.  Musculoskeletal: Negative for arthralgias.  Neurological: Positive for headaches.    Objective:  BP 116/68   Pulse 71   Temp (!) 97.4 F  (36.3 C) (Oral)   Ht 5' 6"  (1.676 m)   Wt 207 lb 4 oz (94 kg)   BMI 33.45 kg/m   BP Readings from Last 3 Encounters:  07/26/17 116/68  05/17/17 103/66  02/07/17 114/73    Wt Readings from Last 3 Encounters:  07/26/17 207 lb 4 oz (94 kg)  05/17/17 203 lb 4 oz (92.2 kg)  02/07/17 200 lb (90.7 kg)     Physical Exam  Constitutional: She is oriented to person, place, and time. She appears well-developed and well-nourished. She appears distressed (Due to pain).  HENT:  Head: Normocephalic and atraumatic.  Eyes: Pupils are equal, round, and reactive to light. Conjunctivae are normal.  Neck: Normal range of motion. Neck supple. No thyromegaly present.  Cardiovascular: Normal rate, regular rhythm and normal heart sounds.  No murmur heard. Pulmonary/Chest: Effort normal and breath sounds normal. No respiratory distress. She has no wheezes. She has no rales.  Abdominal: Soft. Bowel sounds are normal. She exhibits no distension. There is no tenderness.  Musculoskeletal: Normal range of motion.  Lymphadenopathy:    She has no cervical adenopathy.  Neurological: She is alert and oriented to person, place, and time.  Skin: Skin is warm and dry.  Psychiatric: She has a normal mood and affect. Her behavior is normal. Judgment and thought content normal.      Assessment & Plan:   Kameisha was seen today for migraine.  Diagnoses and all orders for  this visit:  Other migraine without status migrainosus, not intractable -     betamethasone acetate-betamethasone sodium phosphate (CELESTONE) injection 6 mg -     ketorolac (TORADOL) injection 60 mg  Tick bite, initial encounter -     CBC with Differential/Platelet -     CMP14+EGFR -     Lyme Ab/Western Blot Reflex -     Rocky mtn spotted fvr abs pnl(IgG+IgM) -     Ehrlichia Antibody Panel  Other orders -     doxycycline (VIBRA-TABS) 100 MG tablet; Take 1 tablet (100 mg total) by mouth 2 (two) times daily. -     ondansetron  (ZOFRAN-ODT) 8 MG disintegrating tablet; Take 1 tablet (8 mg total) by mouth every 8 (eight) hours as needed for nausea or vomiting.       I have discontinued Bracha D. Ainley's rizatriptan. I am also having her start on doxycycline and ondansetron. Additionally, I am having her maintain her atorvastatin, pantoprazole, ondansetron, ALPRAZolam, and Vitamin D (Ergocalciferol). We administered betamethasone acetate-betamethasone sodium phosphate and ketorolac. We will continue to administer cyanocobalamin.  Allergies as of 07/26/2017   No Known Allergies     Medication List        Accurate as of 07/26/17 11:59 PM. Always use your most recent med list.          ALPRAZolam 0.5 MG tablet Commonly known as:  XANAX take 1 to 2 tablets by mouth twice a day if needed for anxiety   atorvastatin 40 MG tablet Commonly known as:  LIPITOR Take 1 tablet (40 mg total) by mouth daily.   doxycycline 100 MG tablet Commonly known as:  VIBRA-TABS Take 1 tablet (100 mg total) by mouth 2 (two) times daily.   ondansetron 4 MG tablet Commonly known as:  ZOFRAN Take 1 tablet (4 mg total) by mouth every 8 (eight) hours as needed for nausea or vomiting.   ondansetron 8 MG disintegrating tablet Commonly known as:  ZOFRAN-ODT Take 1 tablet (8 mg total) by mouth every 8 (eight) hours as needed for nausea or vomiting.   pantoprazole 40 MG tablet Commonly known as:  PROTONIX Take 1 tablet (40 mg total) by mouth daily.   Vitamin D (Ergocalciferol) 50000 units Caps capsule Commonly known as:  DRISDOL Take 1 capsule (50,000 Units total) by mouth every 7 (seven) days.        Follow-up: Return if symptoms worsen or fail to improve.  Claretta Fraise, M.D.

## 2017-07-28 LAB — CBC WITH DIFFERENTIAL/PLATELET
BASOS ABS: 0.1 10*3/uL (ref 0.0–0.2)
BASOS: 1 %
EOS (ABSOLUTE): 0.3 10*3/uL (ref 0.0–0.4)
Eos: 4 %
Hematocrit: 39.1 % (ref 34.0–46.6)
Hemoglobin: 12.6 g/dL (ref 11.1–15.9)
IMMATURE GRANULOCYTES: 0 %
Immature Grans (Abs): 0 10*3/uL (ref 0.0–0.1)
Lymphocytes Absolute: 1.8 10*3/uL (ref 0.7–3.1)
Lymphs: 25 %
MCH: 27.3 pg (ref 26.6–33.0)
MCHC: 32.2 g/dL (ref 31.5–35.7)
MCV: 85 fL (ref 79–97)
Monocytes Absolute: 0.5 10*3/uL (ref 0.1–0.9)
Monocytes: 8 %
NEUTROS ABS: 4.3 10*3/uL (ref 1.4–7.0)
NEUTROS PCT: 62 %
PLATELETS: 393 10*3/uL (ref 150–450)
RBC: 4.61 x10E6/uL (ref 3.77–5.28)
RDW: 16.6 % — ABNORMAL HIGH (ref 12.3–15.4)
WBC: 7 10*3/uL (ref 3.4–10.8)

## 2017-07-28 LAB — ROCKY MTN SPOTTED FVR ABS PNL(IGG+IGM)
RMSF IGG: NEGATIVE
RMSF IgM: 0.89 index (ref 0.00–0.89)

## 2017-07-28 LAB — CMP14+EGFR
A/G RATIO: 1.6 (ref 1.2–2.2)
ALT: 14 IU/L (ref 0–32)
AST: 14 IU/L (ref 0–40)
Albumin: 4 g/dL (ref 3.5–5.5)
Alkaline Phosphatase: 90 IU/L (ref 39–117)
BILIRUBIN TOTAL: 0.2 mg/dL (ref 0.0–1.2)
BUN/Creatinine Ratio: 12 (ref 9–23)
BUN: 9 mg/dL (ref 6–24)
CHLORIDE: 107 mmol/L — AB (ref 96–106)
CO2: 23 mmol/L (ref 20–29)
Calcium: 8.6 mg/dL — ABNORMAL LOW (ref 8.7–10.2)
Creatinine, Ser: 0.76 mg/dL (ref 0.57–1.00)
GFR calc non Af Amer: 95 mL/min/{1.73_m2} (ref >59)
GFR, EST AFRICAN AMERICAN: 110 mL/min/{1.73_m2} (ref >59)
Globulin, Total: 2.5 g/dL (ref 1.5–4.5)
Glucose: 86 mg/dL (ref 65–99)
POTASSIUM: 4.2 mmol/L (ref 3.5–5.2)
Sodium: 144 mmol/L (ref 134–144)
Total Protein: 6.5 g/dL (ref 6.0–8.5)

## 2017-07-28 LAB — EHRLICHIA ANTIBODY PANEL
E. Chaffeensis (HME) IgM Titer: NEGATIVE
E.Chaffeensis (HME) IgG: NEGATIVE
HGE IgG Titer: NEGATIVE
HGE IgM Titer: NEGATIVE

## 2017-07-28 LAB — LYME AB/WESTERN BLOT REFLEX
LYME DISEASE AB, QUANT, IGM: <0.8 index (ref 0.00–0.79)
Lyme IgG/IgM Ab: <0.91 {ISR} (ref 0.00–0.90)

## 2017-07-31 ENCOUNTER — Encounter: Payer: Self-pay | Admitting: Family Medicine

## 2017-08-07 ENCOUNTER — Other Ambulatory Visit: Payer: Self-pay | Admitting: Family Medicine

## 2017-08-08 ENCOUNTER — Ambulatory Visit: Payer: 59 | Admitting: Family Medicine

## 2017-08-08 ENCOUNTER — Encounter: Payer: Self-pay | Admitting: Family Medicine

## 2017-08-08 VITALS — BP 105/72 | HR 88 | Temp 97.3°F | Ht 66.0 in | Wt 212.4 lb

## 2017-08-08 DIAGNOSIS — F419 Anxiety disorder, unspecified: Secondary | ICD-10-CM

## 2017-08-08 DIAGNOSIS — R300 Dysuria: Secondary | ICD-10-CM | POA: Diagnosis not present

## 2017-08-08 DIAGNOSIS — L409 Psoriasis, unspecified: Secondary | ICD-10-CM

## 2017-08-08 DIAGNOSIS — Z6834 Body mass index (BMI) 34.0-34.9, adult: Secondary | ICD-10-CM

## 2017-08-08 DIAGNOSIS — G43809 Other migraine, not intractable, without status migrainosus: Secondary | ICD-10-CM | POA: Diagnosis not present

## 2017-08-08 DIAGNOSIS — E6609 Other obesity due to excess calories: Secondary | ICD-10-CM

## 2017-08-08 LAB — URINALYSIS
Bilirubin, UA: NEGATIVE
Glucose, UA: NEGATIVE
Ketones, UA: NEGATIVE
LEUKOCYTES UA: NEGATIVE
Nitrite, UA: NEGATIVE
PH UA: 7 (ref 5.0–7.5)
PROTEIN UA: NEGATIVE
RBC, UA: NEGATIVE
SPEC GRAV UA: 1.02 (ref 1.005–1.030)
Urobilinogen, Ur: 0.2 mg/dL (ref 0.2–1.0)

## 2017-08-08 MED ORDER — PHENTERMINE HCL 37.5 MG PO CAPS: 37.5000 mg | ORAL_CAPSULE | ORAL | 2 refills | Status: DC

## 2017-08-08 MED ORDER — TOPIRAMATE 25 MG PO TABS: ORAL_TABLET | ORAL | 0 refills | Status: DC

## 2017-08-08 MED ORDER — ALPRAZOLAM 0.5 MG PO TABS: ORAL_TABLET | ORAL | 5 refills | Status: DC

## 2017-08-08 MED ORDER — CLOBETASOL PROPIONATE 0.05 % EX OINT: 1.0000 "application " | TOPICAL_OINTMENT | Freq: Two times a day (BID) | CUTANEOUS | 5 refills | Status: DC

## 2017-08-08 NOTE — Progress Notes (Signed)
Subjective:  Patient ID: Kathy Ewing, female    DOB: 12/23/1970  Age: 47 y.o. MRN: [id number]  CC: Anxiety (pt here today for refills on her Xanax) and Urinary Tract Infection (c/o dysuria, cloudy appearing urine and flank pain)   HPI [name] presents for recurrent anxiety.  She has some vague abdominal pain she is concerned might be related to a UTI she like to have a urine checked.  Additionally she has continued to have migraines and would like to go ahead with prevention.  She is having 1 or 2 a week lasting a day 6-8/10.  However the severe headache that she had when she was here most recently has resolved.  Patient also has been using contaminant in the past and would like to resume that medication.  She has been putting weight back on since putting it on hold a few weeks ago.  Depression screen [facility] 2/9 08/08/2017 07/26/2017 05/17/2017  Decreased Interest 1 1 1   Down, Depressed, Hopeless 0 0 0  PHQ - 2 Score 1 1 1     History Kathy Ewing has a past medical history of Anxiety, B12 deficiency, Cholelithiasis, GERD (gastroesophageal reflux disease), Headache, High cholesterol, Numbness, Pneumonia, and Vitamin D deficiency.   She has a past surgical history that includes Cesarean section and Cholecystectomy (N/A, 10/02/2014).   Her family history includes Anxiety disorder in her brother; Cerebral palsy in her daughter; Depression in her daughter; Hypertension in her mother; Migraines in her sister; Multiple sclerosis in her sister.She reports that she has been smoking cigarettes.  She has a 15.00 pack-year smoking history. She has never used smokeless tobacco. She reports that she does not drink alcohol or use drugs.    ROS Review of Systems  Constitutional: Negative.   HENT: Negative for congestion.   Eyes: Negative for visual disturbance.  Respiratory: Negative for shortness of breath.   Cardiovascular: Negative for chest pain.  Gastrointestinal: Negative for abdominal pain,  constipation, diarrhea, nausea and vomiting.  Genitourinary: Positive for flank pain. Negative for difficulty urinating, dysuria and urgency.  Musculoskeletal: Negative for arthralgias and myalgias.  Skin: Positive for rash (History of psoriasis she has an outbreak on the left elbow now.  She says she rubs it with a towel and describes it at times to decrease the plaque.  It breaks out on her knees frequently as well but those are not active right now.).  Neurological: Negative for headaches.  Psychiatric/Behavioral: Negative for sleep disturbance.    Objective:  BP 105/72   Pulse 88   Temp (!) 97.3 F (36.3 C) (Oral)   Ht 5\' 6"  (1.676 m)   Wt 212 lb 6 oz (96.3 kg)   BMI 34.28 kg/m   BP Readings from Last 3 Encounters:  08/08/17 105/72  07/26/17 116/68  05/17/17 103/66    Wt Readings from Last 3 Encounters:  08/08/17 212 lb 6 oz (96.3 kg)  07/26/17 207 lb 4 oz (94 kg)  05/17/17 203 lb 4 oz (92.2 kg)     Physical Exam    Assessment & Plan:   Jeorgia was seen today for anxiety and urinary tract infection.  Diagnoses and all orders for this visit:  Dysuria -     Urine Culture -     Urinalysis  Other migraine without status migrainosus, not intractable  Anxiety -     ALPRAZolam (XANAX) 0.5 MG tablet; take 1 to 2 tablets by mouth twice a day if needed for anxiety  Psoriasis  Class 1 obesity due to excess calories without serious comorbidity with body mass index (BMI) of 34.0 to 34.9 in adult  Other orders -     clobetasol ointment (TEMOVATE) 0.05 %; Apply 1 application topically 2 (two) times daily. -     phentermine 37.5 MG capsule; Take 1 capsule (37.5 mg total) by mouth every morning. -     topiramate (TOPAMAX) 25 MG tablet; Take 1 PO QHS x 1week, then 2 QHS x 1week, then 3 QHS x 1 week, then 4 QHS x 1 week.       I have discontinued Aayana D. Cavallaro's doxycycline. I am also having her start on clobetasol ointment, phentermine, and topiramate.  Additionally, I am having her maintain her atorvastatin, Vitamin D (Ergocalciferol), ondansetron, pantoprazole, and ALPRAZolam. We will continue to administer cyanocobalamin.  Allergies as of 08/08/2017   No Known Allergies     Medication List        Accurate as of 08/08/17  7:37 PM. Always use your most recent med list.          ALPRAZolam 0.5 MG tablet Commonly known as:  XANAX take 1 to 2 tablets by mouth twice a day if needed for anxiety   atorvastatin 40 MG tablet Commonly known as:  LIPITOR Take 1 tablet (40 mg total) by mouth daily.   clobetasol ointment 0.05 % Commonly known as:  TEMOVATE Apply 1 application topically 2 (two) times daily.   ondansetron 8 MG disintegrating tablet Commonly known as:  ZOFRAN-ODT Take 1 tablet (8 mg total) by mouth every 8 (eight) hours as needed for nausea or vomiting.   pantoprazole 40 MG tablet Commonly known as:  PROTONIX TAKE 1 TABLET BY MOUTH ONCE DAILY   phentermine 37.5 MG capsule Take 1 capsule (37.5 mg total) by mouth every morning.   topiramate 25 MG tablet Commonly known as:  TOPAMAX Take 1 PO QHS x 1week, then 2 QHS x 1week, then 3 QHS x 1 week, then 4 QHS x 1 week.   Vitamin D (Ergocalciferol) 50000 units Caps capsule Commonly known as:  DRISDOL Take 1 capsule (50,000 Units total) by mouth every 7 (seven) days.        Follow-up: Return in about 1 month (around 09/08/2017).  Claretta Fraise, M.D.

## 2017-08-09 ENCOUNTER — Other Ambulatory Visit: Payer: Self-pay | Admitting: Family Medicine

## 2017-08-09 DIAGNOSIS — F419 Anxiety disorder, unspecified: Secondary | ICD-10-CM

## 2017-08-09 LAB — URINE CULTURE

## 2017-08-14 ENCOUNTER — Telehealth: Payer: Self-pay | Admitting: Family Medicine

## 2017-08-14 NOTE — Telephone Encounter (Signed)
lmtcb

## 2017-08-21 ENCOUNTER — Ambulatory Visit: Payer: 59 | Admitting: Family Medicine

## 2017-09-05 ENCOUNTER — Other Ambulatory Visit: Payer: Self-pay | Admitting: Family Medicine

## 2017-09-08 ENCOUNTER — Ambulatory Visit: Payer: 59 | Admitting: Family Medicine

## 2017-09-08 ENCOUNTER — Encounter: Payer: Self-pay | Admitting: Family Medicine

## 2017-09-08 VITALS — BP 106/73 | HR 87 | Temp 97.2°F | Ht 66.0 in | Wt 193.0 lb

## 2017-09-08 DIAGNOSIS — R1084 Generalized abdominal pain: Secondary | ICD-10-CM

## 2017-09-08 DIAGNOSIS — G43809 Other migraine, not intractable, without status migrainosus: Secondary | ICD-10-CM

## 2017-09-08 DIAGNOSIS — E538 Deficiency of other specified B group vitamins: Secondary | ICD-10-CM | POA: Diagnosis not present

## 2017-09-08 MED ORDER — TOPIRAMATE 25 MG PO TABS: 25.0000 mg | ORAL_TABLET | Freq: Every day | ORAL | 2 refills | Status: DC

## 2017-09-08 MED ORDER — DIVALPROEX SODIUM 500 MG PO DR TAB: 500.0000 mg | DELAYED_RELEASE_TABLET | Freq: Every day | ORAL | 2 refills | Status: DC

## 2017-09-08 NOTE — Progress Notes (Signed)
Subjective:  Patient ID: Kathy Ewing, female    DOB: 1970/12/06  Age: 47 y.o. MRN: [id number]  CC: Migraine (pt here today for 1 month follow up of her migraine)   HPI Kathy Ewing presents for recheck of migraine. Couldn't tolerate the topiramate at higher dose so went back to 25 mg and has  had decreased frequency of HA, but still having multple monthly that can be disabling. Topiramate also is helping with weight loss so she wants to continue low dose. Continued abdominal pain. Cramping at times.   Depression screen [facility] 2/9 09/08/2017 08/08/2017 07/26/2017  Decreased Interest 1 1 1   Down, Depressed, Hopeless 0 0 0  PHQ - 2 Score 1 1 1     History Kathy Ewing has a past medical history of Anxiety, B12 deficiency, Cholelithiasis, GERD (gastroesophageal reflux disease), Headache, High cholesterol, Numbness, Pneumonia, and Vitamin D deficiency.   She has a past surgical history that includes Cesarean section and Cholecystectomy (N/A, 10/02/2014).   Her family history includes Anxiety disorder in her brother; Cerebral palsy in her daughter; Depression in her daughter; Hypertension in her mother; Migraines in her sister; Multiple sclerosis in her sister.She reports that she has been smoking cigarettes. She has a 15.00 pack-year smoking history. She has never used smokeless tobacco. She reports that she does not drink alcohol or use drugs.    ROS Review of Systems  Constitutional: Negative.   HENT: Negative.   Eyes: Negative for visual disturbance.  Respiratory: Negative for shortness of breath.   Cardiovascular: Negative for chest pain.  Gastrointestinal: Positive for abdominal pain and nausea.  Musculoskeletal: Negative for arthralgias.  Neurological: Positive for headaches.    Objective:  BP 106/73   Pulse 87   Temp (!) 97.2 F (36.2 C) (Oral)   Ht 5\' 6"  (1.676 m)   Wt 193 lb (87.5 kg)   BMI 31.15 kg/m   BP Readings from Last 3 Encounters:  09/08/17 106/73  08/08/17  105/72  07/26/17 116/68    Wt Readings from Last 3 Encounters:  09/08/17 193 lb (87.5 kg)  08/08/17 212 lb 6 oz (96.3 kg)  07/26/17 207 lb 4 oz (94 kg)     Physical Exam  Constitutional: She is oriented to person, place, and time. She appears well-developed and well-nourished. No distress.  HENT:  Head: Normocephalic and atraumatic.  Right Ear: External ear normal.  Left Ear: External ear normal.  Nose: Nose normal.  Mouth/Throat: Oropharynx is clear and moist.  Eyes: Pupils are equal, round, and reactive to light. Conjunctivae and EOM are normal.  Neck: Normal range of motion. Neck supple. No thyromegaly present.  Cardiovascular: Normal rate, regular rhythm and normal heart sounds.  No murmur heard. Pulmonary/Chest: Effort normal and breath sounds normal. No respiratory distress. She has no wheezes. She has no rales.  Abdominal: Soft. Bowel sounds are normal. She exhibits no distension. There is no tenderness.  Lymphadenopathy:    She has no cervical adenopathy.  Neurological: She is alert and oriented to person, place, and time. She has normal reflexes.  Skin: Skin is warm and dry.  Psychiatric: She has a normal mood and affect. Her behavior is normal. Judgment and thought content normal.      Assessment & Plan:   Donetta was seen today for migraine.  Diagnoses and all orders for this visit:  Other migraine without status migrainosus, not intractable  Generalized abdominal pain -     CT ABDOMEN W WO CONTRAST; Future -  CT CHEST W WO CONTRAST; Future  Other orders -     topiramate (TOPAMAX) 25 MG tablet; Take 1 tablet (25 mg total) by mouth at bedtime. -     divalproex (DEPAKOTE) 500 MG DR tablet; Take 1 tablet (500 mg total) by mouth daily.       I have changed Kathy Ewing's topiramate. I am also having her start on divalproex. Additionally, I am having her maintain her atorvastatin, Vitamin D (Ergocalciferol), ondansetron, pantoprazole, clobetasol  ointment, phentermine, ALPRAZolam, and lamoTRIgine. We administered cyanocobalamin. We will continue to administer cyanocobalamin.  Allergies as of 09/08/2017   No Known Allergies     Medication List        Accurate as of 09/08/17 11:59 PM. Always use your most recent med list.          ALPRAZolam 0.5 MG tablet Commonly known as:  XANAX TAKE 1 OR 2 TABLETS BY MOUTH TWICE A DAY IF NEEDED FOR ANXIETY   atorvastatin 40 MG tablet Commonly known as:  LIPITOR Take 1 tablet (40 mg total) by mouth daily.   clobetasol ointment 0.05 % Commonly known as:  TEMOVATE Apply 1 application topically 2 (two) times daily.   divalproex 500 MG DR tablet Commonly known as:  DEPAKOTE Take 1 tablet (500 mg total) by mouth daily.   lamoTRIgine 25 MG tablet Commonly known as:  LAMICTAL   ondansetron 8 MG disintegrating tablet Commonly known as:  ZOFRAN-ODT Take 1 tablet (8 mg total) by mouth every 8 (eight) hours as needed for nausea or vomiting.   pantoprazole 40 MG tablet Commonly known as:  PROTONIX TAKE 1 TABLET BY MOUTH ONCE DAILY   phentermine 37.5 MG capsule Take 1 capsule (37.5 mg total) by mouth every morning.   topiramate 25 MG tablet Commonly known as:  TOPAMAX Take 1 tablet (25 mg total) by mouth at bedtime.   Vitamin D (Ergocalciferol) 50000 units Caps capsule Commonly known as:  DRISDOL Take 1 capsule (50,000 Units total) by mouth every 7 (seven) days.        Follow-up: Return in about 6 months (around 03/11/2018).  Claretta Fraise, M.D.

## 2017-09-09 ENCOUNTER — Other Ambulatory Visit: Payer: Self-pay | Admitting: Family Medicine

## 2017-09-09 DIAGNOSIS — F419 Anxiety disorder, unspecified: Secondary | ICD-10-CM

## 2017-09-10 ENCOUNTER — Encounter: Payer: Self-pay | Admitting: Family Medicine

## 2017-09-11 NOTE — Telephone Encounter (Signed)
Last seen 09/08/17  Dr Livia Snellen

## 2017-09-25 ENCOUNTER — Other Ambulatory Visit: Payer: Self-pay | Admitting: Family Medicine

## 2017-09-25 DIAGNOSIS — R1084 Generalized abdominal pain: Secondary | ICD-10-CM

## 2017-10-03 ENCOUNTER — Other Ambulatory Visit: Payer: Self-pay | Admitting: Family Medicine

## 2017-10-09 ENCOUNTER — Ambulatory Visit (INDEPENDENT_AMBULATORY_CARE_PROVIDER_SITE_OTHER): Payer: 59 | Admitting: *Deleted

## 2017-10-09 DIAGNOSIS — E538 Deficiency of other specified B group vitamins: Secondary | ICD-10-CM | POA: Diagnosis not present

## 2017-10-09 NOTE — Progress Notes (Signed)
Pt given Cyanocobalamin inj Tolerated well 

## 2017-10-16 ENCOUNTER — Other Ambulatory Visit: Payer: Self-pay

## 2017-10-26 ENCOUNTER — Telehealth: Payer: Self-pay | Admitting: Family Medicine

## 2017-10-26 NOTE — Telephone Encounter (Signed)
Have we resolved this?

## 2017-10-27 NOTE — Telephone Encounter (Signed)
Orders faxed

## 2017-11-02 ENCOUNTER — Telehealth: Payer: Self-pay | Admitting: Family Medicine

## 2017-11-02 NOTE — Telephone Encounter (Signed)
Please review ultra sound under care everywhere [facility]

## 2017-11-02 NOTE — Telephone Encounter (Signed)
Patient calling about abdominal U/S she had done Saturday. Patient would like results. If she does not pick up leave her a message she is at work and can not have her phone on the floor. Please advise. Patient states she had done at [facility].

## 2017-11-03 NOTE — Telephone Encounter (Signed)
The ultrasound came back normal. Would you please call and let her know? Thanks, WS

## 2017-11-03 NOTE — Telephone Encounter (Signed)
pt aware 

## 2017-11-05 ENCOUNTER — Other Ambulatory Visit: Payer: Self-pay | Admitting: Family Medicine

## 2017-11-08 ENCOUNTER — Ambulatory Visit: Payer: 59 | Admitting: Family Medicine

## 2017-11-08 ENCOUNTER — Encounter: Payer: Self-pay | Admitting: Family Medicine

## 2017-11-08 VITALS — BP 114/78 | HR 88 | Temp 97.6°F | Ht 66.0 in | Wt 188.0 lb

## 2017-11-08 DIAGNOSIS — Z23 Encounter for immunization: Secondary | ICD-10-CM

## 2017-11-08 DIAGNOSIS — E538 Deficiency of other specified B group vitamins: Secondary | ICD-10-CM

## 2017-11-08 DIAGNOSIS — E782 Mixed hyperlipidemia: Secondary | ICD-10-CM | POA: Diagnosis not present

## 2017-11-08 DIAGNOSIS — F419 Anxiety disorder, unspecified: Secondary | ICD-10-CM | POA: Diagnosis not present

## 2017-11-08 MED ORDER — PHENTERMINE HCL 37.5 MG PO CAPS: 37.5000 mg | ORAL_CAPSULE | ORAL | 5 refills | Status: DC

## 2017-11-08 MED ORDER — ALPRAZOLAM 0.5 MG PO TABS: ORAL_TABLET | ORAL | 5 refills | Status: DC

## 2017-11-08 MED ORDER — ATORVASTATIN CALCIUM 40 MG PO TABS: 40.0000 mg | ORAL_TABLET | Freq: Every day | ORAL | 3 refills | Status: DC

## 2017-11-08 MED ORDER — CLOBETASOL PROPIONATE 0.05 % EX OINT: 1.0000 "application " | TOPICAL_OINTMENT | Freq: Two times a day (BID) | CUTANEOUS | 5 refills | Status: DC

## 2017-11-08 MED ORDER — TOPIRAMATE 25 MG PO TABS: 25.0000 mg | ORAL_TABLET | Freq: Every day | ORAL | 5 refills | Status: DC

## 2017-11-08 MED ORDER — DIVALPROEX SODIUM 500 MG PO DR TAB: 500.0000 mg | DELAYED_RELEASE_TABLET | Freq: Two times a day (BID) | ORAL | 5 refills | Status: DC

## 2017-11-08 MED ORDER — PANTOPRAZOLE SODIUM 40 MG PO TBEC: 40.0000 mg | DELAYED_RELEASE_TABLET | Freq: Every day | ORAL | 1 refills | Status: DC

## 2017-11-08 MED ORDER — ONDANSETRON 8 MG PO TBDP: 8.0000 mg | ORAL_TABLET | Freq: Three times a day (TID) | ORAL | 0 refills | Status: DC | PRN

## 2017-11-08 NOTE — Progress Notes (Signed)
B-12 injection given to left deltoid, patient tolerated well 

## 2017-11-08 NOTE — Progress Notes (Signed)
Subjective:  Patient ID: Kathy Ewing, female    DOB: 1970/05/28  Age: 47 y.o. MRN: [id number]  CC: Medical Management of Chronic Issues   HPI Kathy Ewing presents for recheck of migraines.  She tells me that her headaches are coming a little less frequently.  She has been trying to take the topiramate twice a day when possible but still has some problems with it particularly if she goes above 2.  She is staying with 1 of the Depakote so far.  Denies any side effects from it.  She is continuing to lose weight although it has slowed down she is continue to work on cutting back in her diet as well and has exercising.  She has lost 5 more pounds since last office visit.  Her anxiety level is high.  She says this time of year it always goes out.  In particular she is having stress sores related to her mother and that relationship as well as her mother's health.  Depression screen [facility] 2/9 11/08/2017 09/08/2017 08/08/2017  Decreased Interest 1 1 1   Down, Depressed, Hopeless 0 0 0  PHQ - 2 Score 1 1 1     History Kathy Ewing has a past medical history of Anxiety, B12 deficiency, Cholelithiasis, GERD (gastroesophageal reflux disease), Headache, High cholesterol, Numbness, Pneumonia, and Vitamin D deficiency.   She has a past surgical history that includes Cesarean section and Cholecystectomy (N/A, 10/02/2014).   Her family history includes Anxiety disorder in her brother; Cerebral palsy in her daughter; Depression in her daughter; Hypertension in her mother; Migraines in her sister; Multiple sclerosis in her sister.She reports that she has been smoking cigarettes. She has a 15.00 pack-year smoking history. She has never used smokeless tobacco. She reports that she does not drink alcohol or use drugs.    ROS Review of Systems  Constitutional: Negative.   HENT: Negative.   Eyes: Negative for visual disturbance.  Respiratory: Negative for shortness of breath.   Cardiovascular: Negative for chest  pain.  Gastrointestinal: Negative for abdominal pain.  Musculoskeletal: Negative for arthralgias.    Objective:  BP 114/78   Pulse 88   Temp 97.6 F (36.4 C) (Oral)   Ht 5\' 6"  (1.676 m)   Wt 188 lb (85.3 kg)   BMI 30.34 kg/m   BP Readings from Last 3 Encounters:  11/08/17 114/78  09/08/17 106/73  08/08/17 105/72    Wt Readings from Last 3 Encounters:  11/08/17 188 lb (85.3 kg)  09/08/17 193 lb (87.5 kg)  08/08/17 212 lb 6 oz (96.3 kg)     Physical Exam  Constitutional: She is oriented to person, place, and time. She appears well-developed and well-nourished. No distress.  Cardiovascular: Normal rate and regular rhythm.  Pulmonary/Chest: Breath sounds normal.  Neurological: She is alert and oriented to person, place, and time.  Skin: Skin is warm and dry.  Psychiatric: She has a normal mood and affect.      Assessment & Plan:   Kathy Ewing was seen today for medical management of chronic issues.  Diagnoses and all orders for this visit:  Mixed hyperlipidemia  Anxiety -     ALPRAZolam (XANAX) 0.5 MG tablet; TAKE 1 OR 2 TABLETS BY MOUTH TWICE DAILY IF NEEDED FOR ANXIETY  Other orders -     atorvastatin (LIPITOR) 40 MG tablet; Take 1 tablet (40 mg total) by mouth daily. -     clobetasol ointment (TEMOVATE) 0.05 %; Apply 1 application topically 2 (two) times daily. -  divalproex (DEPAKOTE) 500 MG DR tablet; Take 1 tablet (500 mg total) by mouth 2 (two) times daily. -     ondansetron (ZOFRAN-ODT) 8 MG disintegrating tablet; Take 1 tablet (8 mg total) by mouth every 8 (eight) hours as needed for nausea or vomiting. -     pantoprazole (PROTONIX) 40 MG tablet; Take 1 tablet (40 mg total) by mouth daily. -     phentermine 37.5 MG capsule; Take 1 capsule (37.5 mg total) by mouth every morning. -     topiramate (TOPAMAX) 25 MG tablet; Take 1 tablet (25 mg total) by mouth at bedtime.       I have changed Kathy Ewing's atorvastatin, divalproex, and pantoprazole. I  am also having her maintain her Vitamin D (Ergocalciferol), lamoTRIgine, ALPRAZolam, clobetasol ointment, ondansetron, phentermine, and topiramate. We will continue to administer cyanocobalamin.  Allergies as of 11/08/2017   No Known Allergies     Medication List        Accurate as of 11/08/17 11:30 AM. Always use your most recent med list.          ALPRAZolam 0.5 MG tablet Commonly known as:  XANAX TAKE 1 OR 2 TABLETS BY MOUTH TWICE DAILY IF NEEDED FOR ANXIETY   atorvastatin 40 MG tablet Commonly known as:  LIPITOR Take 1 tablet (40 mg total) by mouth daily.   clobetasol ointment 0.05 % Commonly known as:  TEMOVATE Apply 1 application topically 2 (two) times daily.   divalproex 500 MG DR tablet Commonly known as:  DEPAKOTE Take 1 tablet (500 mg total) by mouth 2 (two) times daily.   lamoTRIgine 25 MG tablet Commonly known as:  LAMICTAL   ondansetron 8 MG disintegrating tablet Commonly known as:  ZOFRAN-ODT Take 1 tablet (8 mg total) by mouth every 8 (eight) hours as needed for nausea or vomiting.   pantoprazole 40 MG tablet Commonly known as:  PROTONIX Take 1 tablet (40 mg total) by mouth daily.   phentermine 37.5 MG capsule Take 1 capsule (37.5 mg total) by mouth every morning.   topiramate 25 MG tablet Commonly known as:  TOPAMAX Take 1 tablet (25 mg total) by mouth at bedtime.   Vitamin D (Ergocalciferol) 50000 units Caps capsule Commonly known as:  DRISDOL Take 1 capsule (50,000 Units total) by mouth every 7 (seven) days.        Follow-up: No follow-ups on file.  Claretta Fraise, M.D.

## 2017-12-11 ENCOUNTER — Ambulatory Visit: Payer: 59

## 2017-12-18 ENCOUNTER — Other Ambulatory Visit: Payer: Self-pay | Admitting: Family Medicine

## 2017-12-22 ENCOUNTER — Ambulatory Visit (INDEPENDENT_AMBULATORY_CARE_PROVIDER_SITE_OTHER): Payer: 59 | Admitting: *Deleted

## 2017-12-22 DIAGNOSIS — E538 Deficiency of other specified B group vitamins: Secondary | ICD-10-CM

## 2017-12-22 NOTE — Progress Notes (Signed)
Pt given cyanocobalamin inj Tolerated well 

## 2018-01-22 DIAGNOSIS — Z029 Encounter for administrative examinations, unspecified: Secondary | ICD-10-CM

## 2018-01-23 ENCOUNTER — Ambulatory Visit: Payer: 59

## 2018-01-24 ENCOUNTER — Ambulatory Visit (INDEPENDENT_AMBULATORY_CARE_PROVIDER_SITE_OTHER): Payer: 59 | Admitting: Family Medicine

## 2018-01-24 ENCOUNTER — Encounter: Payer: Self-pay | Admitting: Family Medicine

## 2018-01-24 ENCOUNTER — Ambulatory Visit: Payer: 59

## 2018-01-24 VITALS — BP 109/78 | HR 89 | Temp 97.6°F | Ht 66.0 in | Wt 193.1 lb

## 2018-01-24 DIAGNOSIS — Z0000 Encounter for general adult medical examination without abnormal findings: Secondary | ICD-10-CM | POA: Diagnosis not present

## 2018-01-24 DIAGNOSIS — E538 Deficiency of other specified B group vitamins: Secondary | ICD-10-CM

## 2018-01-24 DIAGNOSIS — E782 Mixed hyperlipidemia: Secondary | ICD-10-CM

## 2018-01-24 MED ORDER — PHENTERMINE HCL 37.5 MG PO CAPS: 37.5000 mg | ORAL_CAPSULE | Freq: Two times a day (BID) | ORAL | 2 refills | Status: DC

## 2018-01-24 MED ORDER — TOPIRAMATE 50 MG PO TABS: 50.0000 mg | ORAL_TABLET | Freq: Every day | ORAL | 5 refills | Status: DC

## 2018-01-24 NOTE — Progress Notes (Signed)
Subjective:  Patient ID: Kathy Ewing, female    DOB: 08/12/1970  Age: 47 y.o. MRN: [id number]  CC: Annual Exam (pt here today for a wellness visit to fill out form for insurance at work)   HPI Kathy Ewing presents for Annual physical.   Depression screen [facility] 2/9 01/24/2018 11/08/2017 09/08/2017  Decreased Interest 1 1 1   Down, Depressed, Hopeless 0 0 0  PHQ - 2 Score 1 1 1     History Kathy Ewing has a past medical history of Anxiety, B12 deficiency, Cholelithiasis, GERD (gastroesophageal reflux disease), Headache, High cholesterol, Numbness, Pneumonia, and Vitamin D deficiency.   She has a past surgical history that includes Cesarean section and Cholecystectomy (N/A, 10/02/2014).   Her family history includes Anxiety disorder in her brother; Cerebral palsy in her daughter; Depression in her daughter; Hypertension in her mother; Migraines in her sister; Multiple sclerosis in her sister.She reports that she has been smoking cigarettes. She has a 15.00 pack-year smoking history. She has never used smokeless tobacco. She reports that she does not drink alcohol or use drugs.    ROS Review of Systems  Constitutional: Negative.   HENT: Negative for congestion.   Eyes: Negative for visual disturbance.  Respiratory: Negative for shortness of breath.   Cardiovascular: Negative for chest pain.  Gastrointestinal: Negative for abdominal pain, constipation, diarrhea, nausea and vomiting.  Genitourinary: Negative for difficulty urinating.  Musculoskeletal: Negative for arthralgias and myalgias.  Neurological: Negative for headaches.  Psychiatric/Behavioral: Negative for sleep disturbance.    Objective:  BP 109/78   Pulse 89   Temp 97.6 F (36.4 C) (Oral)   Ht 5' 6"  (1.676 m)   Wt 193 lb 2 oz (87.6 kg)   BMI 31.17 kg/m   BP Readings from Last 3 Encounters:  01/24/18 109/78  11/08/17 114/78  09/08/17 106/73    Wt Readings from Last 3 Encounters:  01/24/18 193 lb 2 oz (87.6 kg)    11/08/17 188 lb (85.3 kg)  09/08/17 193 lb (87.5 kg)     Physical Exam Constitutional:      General: She is not in acute distress.    Appearance: She is well-developed.  HENT:     Head: Normocephalic and atraumatic.     Right Ear: External ear normal.     Left Ear: External ear normal.     Nose: Nose normal.  Eyes:     Conjunctiva/sclera: Conjunctivae normal.     Pupils: Pupils are equal, round, and reactive to light.  Neck:     Musculoskeletal: Normal range of motion and neck supple.     Thyroid: No thyromegaly.  Cardiovascular:     Rate and Rhythm: Normal rate and regular rhythm.     Heart sounds: Normal heart sounds. No murmur.  Pulmonary:     Effort: Pulmonary effort is normal. No respiratory distress.     Breath sounds: Normal breath sounds. No wheezing or rales.  Abdominal:     General: Bowel sounds are normal. There is no distension.     Palpations: Abdomen is soft.     Tenderness: There is no abdominal tenderness.  Lymphadenopathy:     Cervical: No cervical adenopathy.  Skin:    General: Skin is warm and dry.  Neurological:     Mental Status: She is alert and oriented to person, place, and time.     Deep Tendon Reflexes: Reflexes are normal and symmetric.  Psychiatric:        Behavior: Behavior normal.  Thought Content: Thought content normal.        Judgment: Judgment normal.       Assessment & Plan:   Treesa was seen today for annual exam.  Diagnoses and all orders for this visit:  Mixed hyperlipidemia -     CBC with Differential/Platelet -     CMP14+EGFR -     Lipid panel  Well adult exam  Other orders -     phentermine 37.5 MG capsule; Take 1 capsule (37.5 mg total) by mouth 2 (two) times daily with breakfast and lunch. -     topiramate (TOPAMAX) 50 MG tablet; Take 1 tablet (50 mg total) by mouth at bedtime.       I have changed Kalinda D. Granada's phentermine and topiramate. I am also having her maintain her Vitamin D  (Ergocalciferol), lamoTRIgine, ALPRAZolam, atorvastatin, clobetasol ointment, divalproex, ondansetron, pantoprazole, and rizatriptan. We will continue to administer cyanocobalamin.  Allergies as of 01/24/2018   No Known Allergies     Medication List       Accurate as of January 24, 2018 11:59 PM. Always use your most recent med list.        ALPRAZolam 0.5 MG tablet Commonly known as:  XANAX TAKE 1 OR 2 TABLETS BY MOUTH TWICE DAILY IF NEEDED FOR ANXIETY   atorvastatin 40 MG tablet Commonly known as:  LIPITOR Take 1 tablet (40 mg total) by mouth daily.   clobetasol ointment 0.05 % Commonly known as:  TEMOVATE Apply 1 application topically 2 (two) times daily.   divalproex 500 MG DR tablet Commonly known as:  DEPAKOTE Take 1 tablet (500 mg total) by mouth 2 (two) times daily.   lamoTRIgine 25 MG tablet Commonly known as:  LAMICTAL   ondansetron 8 MG disintegrating tablet Commonly known as:  ZOFRAN-ODT Take 1 tablet (8 mg total) by mouth every 8 (eight) hours as needed for nausea or vomiting.   pantoprazole 40 MG tablet Commonly known as:  PROTONIX Take 1 tablet (40 mg total) by mouth daily.   phentermine 37.5 MG capsule Take 1 capsule (37.5 mg total) by mouth 2 (two) times daily with breakfast and lunch.   rizatriptan 10 MG tablet Commonly known as:  MAXALT TAKE 1 TABLET BY MOUTH ONCE DAILY IF NEEDED FOR MIGRAINES   topiramate 50 MG tablet Commonly known as:  TOPAMAX Take 1 tablet (50 mg total) by mouth at bedtime.   Vitamin D (Ergocalciferol) 1.25 MG (50000 UT) Caps capsule Commonly known as:  DRISDOL Take 1 capsule (50,000 Units total) by mouth every 7 (seven) days.        Follow-up: Return in about 3 months (around 04/25/2018).  Claretta Fraise, M.D.

## 2018-01-25 LAB — CMP14+EGFR
ALBUMIN: 4 g/dL (ref 3.5–5.5)
ALK PHOS: 100 IU/L (ref 39–117)
ALT: 14 IU/L (ref 0–32)
AST: 13 IU/L (ref 0–40)
Albumin/Globulin Ratio: 1.7 (ref 1.2–2.2)
BUN/Creatinine Ratio: 10 (ref 9–23)
BUN: 8 mg/dL (ref 6–24)
Bilirubin Total: 0.3 mg/dL (ref 0.0–1.2)
CO2: 23 mmol/L (ref 20–29)
Calcium: 8.7 mg/dL (ref 8.7–10.2)
Chloride: 103 mmol/L (ref 96–106)
Creatinine, Ser: 0.82 mg/dL (ref 0.57–1.00)
GFR calc Af Amer: 100 mL/min/{1.73_m2} (ref >59)
GFR calc non Af Amer: 87 mL/min/{1.73_m2} (ref >59)
Globulin, Total: 2.4 g/dL (ref 1.5–4.5)
Glucose: 84 mg/dL (ref 65–99)
Potassium: 4.4 mmol/L (ref 3.5–5.2)
Sodium: 139 mmol/L (ref 134–144)
TOTAL PROTEIN: 6.4 g/dL (ref 6.0–8.5)

## 2018-01-25 LAB — LIPID PANEL
Chol/HDL Ratio: 3.8 ratio (ref 0.0–4.4)
Cholesterol, Total: 180 mg/dL (ref 100–199)
HDL: 47 mg/dL (ref >39)
LDL CALC: 114 mg/dL — AB (ref 0–99)
Triglycerides: 93 mg/dL (ref 0–149)
VLDL Cholesterol Cal: 19 mg/dL (ref 5–40)

## 2018-01-25 LAB — CBC WITH DIFFERENTIAL/PLATELET
BASOS: 1 %
Basophils Absolute: 0.1 10*3/uL (ref 0.0–0.2)
EOS (ABSOLUTE): 0.4 10*3/uL (ref 0.0–0.4)
Eos: 6 %
HEMATOCRIT: 41 % (ref 34.0–46.6)
HEMOGLOBIN: 13.3 g/dL (ref 11.1–15.9)
IMMATURE GRANS (ABS): 0 10*3/uL (ref 0.0–0.1)
IMMATURE GRANULOCYTES: 1 %
LYMPHS ABS: 1.6 10*3/uL (ref 0.7–3.1)
LYMPHS: 25 %
MCH: 28.5 pg (ref 26.6–33.0)
MCHC: 32.4 g/dL (ref 31.5–35.7)
MCV: 88 fL (ref 79–97)
Monocytes Absolute: 0.7 10*3/uL (ref 0.1–0.9)
Monocytes: 12 %
NEUTROS ABS: 3.6 10*3/uL (ref 1.4–7.0)
Neutrophils: 55 %
Platelets: 334 10*3/uL (ref 150–450)
RBC: 4.66 x10E6/uL (ref 3.77–5.28)
RDW: 14.3 % (ref 11.7–15.4)
WBC: 6.3 10*3/uL (ref 3.4–10.8)

## 2018-01-26 NOTE — Progress Notes (Signed)
Hello Zendayah,  Your lab result is normal.Some minor variations that are not significant are commonly marked abnormal, but do not represent any medical problem for you.  Best regards, Claretta Fraise, M.D.

## 2018-02-04 ENCOUNTER — Encounter: Payer: Self-pay | Admitting: Family Medicine

## 2018-02-12 ENCOUNTER — Other Ambulatory Visit: Payer: Self-pay | Admitting: Family Medicine

## 2018-02-12 ENCOUNTER — Ambulatory Visit: Payer: 59 | Admitting: Family Medicine

## 2018-02-23 ENCOUNTER — Ambulatory Visit: Payer: 59

## 2018-04-15 ENCOUNTER — Other Ambulatory Visit: Payer: Self-pay | Admitting: Family Medicine

## 2018-04-16 NOTE — Telephone Encounter (Signed)
Last seen 01/24/2018  Last Vit D 2018  22.4

## 2018-05-10 ENCOUNTER — Other Ambulatory Visit: Payer: Self-pay | Admitting: Family Medicine

## 2018-05-10 MED ORDER — ONDANSETRON 8 MG PO TBDP: 8.0000 mg | ORAL_TABLET | Freq: Three times a day (TID) | ORAL | 0 refills | Status: DC | PRN

## 2018-05-11 ENCOUNTER — Ambulatory Visit (INDEPENDENT_AMBULATORY_CARE_PROVIDER_SITE_OTHER): Payer: 59 | Admitting: Family Medicine

## 2018-05-11 ENCOUNTER — Encounter: Payer: Self-pay | Admitting: Family Medicine

## 2018-05-11 ENCOUNTER — Other Ambulatory Visit: Payer: Self-pay

## 2018-05-11 DIAGNOSIS — G43709 Chronic migraine without aura, not intractable, without status migrainosus: Secondary | ICD-10-CM

## 2018-05-11 DIAGNOSIS — K219 Gastro-esophageal reflux disease without esophagitis: Secondary | ICD-10-CM

## 2018-05-11 DIAGNOSIS — F419 Anxiety disorder, unspecified: Secondary | ICD-10-CM

## 2018-05-11 DIAGNOSIS — G43111 Migraine with aura, intractable, with status migrainosus: Secondary | ICD-10-CM

## 2018-05-11 MED ORDER — PHENTERMINE HCL 37.5 MG PO CAPS: 37.5000 mg | ORAL_CAPSULE | Freq: Two times a day (BID) | ORAL | 2 refills | Status: DC

## 2018-05-11 MED ORDER — CLOBETASOL PROPIONATE 0.05 % EX OINT: 1.0000 "application " | TOPICAL_OINTMENT | Freq: Two times a day (BID) | CUTANEOUS | 5 refills | Status: DC

## 2018-05-11 MED ORDER — DIVALPROEX SODIUM 500 MG PO DR TAB: 500.0000 mg | DELAYED_RELEASE_TABLET | Freq: Two times a day (BID) | ORAL | 1 refills | Status: DC

## 2018-05-11 MED ORDER — PANTOPRAZOLE SODIUM 40 MG PO TBEC: 40.0000 mg | DELAYED_RELEASE_TABLET | Freq: Every day | ORAL | 1 refills | Status: DC

## 2018-05-11 MED ORDER — ALPRAZOLAM 0.5 MG PO TABS: ORAL_TABLET | ORAL | 5 refills | Status: DC

## 2018-05-11 MED ORDER — ATORVASTATIN CALCIUM 40 MG PO TABS: 40.0000 mg | ORAL_TABLET | Freq: Every day | ORAL | 1 refills | Status: DC

## 2018-05-11 MED ORDER — TOPIRAMATE 50 MG PO TABS: 50.0000 mg | ORAL_TABLET | Freq: Every day | ORAL | 1 refills | Status: DC

## 2018-05-11 MED ORDER — RIZATRIPTAN BENZOATE 10 MG PO TABS: ORAL_TABLET | ORAL | 2 refills | Status: DC

## 2018-05-13 ENCOUNTER — Other Ambulatory Visit: Payer: Self-pay | Admitting: Family Medicine

## 2018-05-14 ENCOUNTER — Ambulatory Visit: Payer: 59 | Admitting: Family Medicine

## 2018-05-14 NOTE — Progress Notes (Signed)
Subjective:    Patient ID: Kathy Ewing, female    DOB: 1970/09/30, 48 y.o.   MRN: [id number]   HPI: Kathy Ewing is a 48 y.o. female presenting for Kathy Ewing is a 48 y.o. female presenting for anxiety. Daily due to work & COVID. PDMP shows no discrepancy. LME =4.0 GAD 7 performed with pt. See below. Weight loss down to 189. Denies side effect.of phentermine. Poor sleep due to death of her daughter - for many years up at night to check on the others. Now only 2-3 hours sleep at night. No record of trying trazodone. Doesn't remember it. But has taken multiple meds for sleep.  GAD 7 : Generalized Anxiety Score 05/11/2018 05/17/2017 03/31/2015 01/14/2015  Nervous, Anxious, on Edge 3 3 2 3   Control/stop worrying 1 3 3 2   Worry too much - different things 2 3 3 2   Trouble relaxing 1 3 2 1   Restless 1 3 1  0  Easily annoyed or irritable 0 1 2 0  Afraid - awful might happen 1 3 3  0  Total GAD 7 Score 9 19 16 8   Anxiety Difficulty - Somewhat difficult Very difficult Not difficult at all      Depression screen [facility] 2/9 01/24/2018 11/08/2017 09/08/2017 08/08/2017 07/26/2017  Decreased Interest 1 1 1 1 1   Down, Depressed, Hopeless 0 0 0 0 0  PHQ - 2 Score 1 1 1 1 1      Relevant past medical, surgical, family and social history reviewed and updated as indicated.  Interim medical history since our last visit reviewed. Allergies and medications reviewed and updated.  ROS:  Review of Systems  Constitutional: Negative.   HENT: Negative for congestion.   Eyes: Negative for visual disturbance.  Respiratory: Negative for shortness of breath.   Cardiovascular: Negative for chest pain.  Gastrointestinal: Negative for abdominal pain, constipation, diarrhea, nausea and vomiting.  Genitourinary: Negative for difficulty urinating.  Musculoskeletal: Negative for arthralgias and myalgias.  Neurological: Positive for headaches (chronic migraine stable with current med. Needs refill of abortive).   Psychiatric/Behavioral: Positive for dysphoric mood and sleep disturbance. The patient is nervous/anxious.      Social History   Tobacco Use  Smoking Status Current Every Day Smoker  . Packs/day: 0.50  . Years: 30.00  . Pack years: 15.00  . Types: Cigarettes  Smokeless Tobacco Never Used       Objective:     Wt Readings from Last 3 Encounters:  01/24/18 193 lb 2 oz (87.6 kg)  11/08/17 188 lb (85.3 kg)  09/08/17 193 lb (87.5 kg)     Exam deferred. Pt. Harboring due to COVID 19. Phone visit performed.   Assessment & Plan:   1. Chronic migraine without aura without status migrainosus, not intractable   2. Gastroesophageal reflux disease without esophagitis   3. Anxiety   4. Intractable migraine with aura with status migrainosus     Meds ordered this encounter  Medications  . atorvastatin (LIPITOR) 40 MG tablet    Sig: Take 1 tablet (40 mg total) by mouth daily.    Dispense:  90 tablet    Refill:  1  . DISCONTD: divalproex (DEPAKOTE) 500 MG DR tablet    Sig: Take 1 tablet (500 mg total) by mouth 2 (two) times daily.    Dispense:  180 tablet    Refill:  1  . pantoprazole (PROTONIX) 40 MG tablet    Sig: Take 1 tablet (40 mg total) by  mouth daily.    Dispense:  90 tablet    Refill:  1  . rizatriptan (MAXALT) 10 MG tablet    Sig: TAKE 1 TABLET BY MOUTH ONCE DAILY IF NEEDED FOR MIGRAINES    Dispense:  10 tablet    Refill:  2  . topiramate (TOPAMAX) 50 MG tablet    Sig: Take 1 tablet (50 mg total) by mouth at bedtime.    Dispense:  90 tablet    Refill:  1  . clobetasol ointment (TEMOVATE) 0.05 %    Sig: Apply 1 application topically 2 (two) times daily.    Dispense:  30 g    Refill:  5  . phentermine 37.5 MG capsule    Sig: Take 1 capsule (37.5 mg total) by mouth 2 (two) times daily with breakfast and lunch.    Dispense:  60 capsule    Refill:  2  . ALPRAZolam (XANAX) 0.5 MG tablet    Sig: TAKE 1 OR 2 TABLETS BY MOUTH TWICE DAILY IF NEEDED FOR ANXIETY     Dispense:  60 tablet    Refill:  5    No orders of the defined types were placed in this encounter.     Diagnoses and all orders for this visit:  Chronic migraine without aura without status migrainosus, not intractable  Gastroesophageal reflux disease without esophagitis  Anxiety -     ALPRAZolam (XANAX) 0.5 MG tablet; TAKE 1 OR 2 TABLETS BY MOUTH TWICE DAILY IF NEEDED FOR ANXIETY  Intractable migraine with aura with status migrainosus  Other orders -     atorvastatin (LIPITOR) 40 MG tablet; Take 1 tablet (40 mg total) by mouth daily. -     Discontinue: divalproex (DEPAKOTE) 500 MG DR tablet; Take 1 tablet (500 mg total) by mouth 2 (two) times daily. -     pantoprazole (PROTONIX) 40 MG tablet; Take 1 tablet (40 mg total) by mouth daily. -     rizatriptan (MAXALT) 10 MG tablet; TAKE 1 TABLET BY MOUTH ONCE DAILY IF NEEDED FOR MIGRAINES -     topiramate (TOPAMAX) 50 MG tablet; Take 1 tablet (50 mg total) by mouth at bedtime. -     clobetasol ointment (TEMOVATE) 0.05 %; Apply 1 application topically 2 (two) times daily. -     phentermine 37.5 MG capsule; Take 1 capsule (37.5 mg total) by mouth 2 (two) times daily with breakfast and lunch.    Virtual Visit via telephone Note  I discussed the limitations, risks, security and privacy concerns of performing an evaluation and management service by telephone and the availability of in person appointments. The patient was identified with two identifiers. Pt.expressed understanding and agreed to proceed. Pt. Is at home. Dr. Livia Snellen is in his office.  Follow Up Instructions:   I discussed the assessment and treatment plan with the patient. The patient was provided an opportunity to ask questions and all were answered. The patient agreed with the plan and demonstrated an understanding of the instructions.   The patient was advised to call back or seek an in-person evaluation if the symptoms worsen or if the condition fails to improve as  anticipated.  Visit started: 8:41 Call ended:  9:00 Total minutes including chart review and phone contact time: 28   Follow up plan: Return in about 6 months (around 11/10/2018).  Claretta Fraise, MD [place]

## 2018-06-22 ENCOUNTER — Other Ambulatory Visit: Payer: Self-pay

## 2018-06-22 ENCOUNTER — Ambulatory Visit (INDEPENDENT_AMBULATORY_CARE_PROVIDER_SITE_OTHER): Payer: 59 | Admitting: Family Medicine

## 2018-06-22 ENCOUNTER — Encounter: Payer: Self-pay | Admitting: Family Medicine

## 2018-06-22 DIAGNOSIS — M609 Myositis, unspecified: Secondary | ICD-10-CM

## 2018-06-22 MED ORDER — PANTOPRAZOLE SODIUM 40 MG PO TBEC: 40.0000 mg | DELAYED_RELEASE_TABLET | Freq: Two times a day (BID) | ORAL | 1 refills | Status: DC

## 2018-06-22 MED ORDER — PREDNISONE 10 MG PO TABS: ORAL_TABLET | ORAL | 0 refills | Status: DC

## 2018-06-22 NOTE — Progress Notes (Addendum)
Subjective:  Patient ID: Kathy Ewing, female    DOB: 01-28-1970  Age: 48 y.o. MRN: [id number]  Virtual Visit via telephone Note  I discussed the limitations, risks, security and privacy concerns of performing an evaluation and management service by telephone and the availability of in person appointments. I also discussed with the patient that there may be a patient responsible charge related to this service. The patient expressed understanding and agreed to proceed. Pt. Is at home. Dr. Livia Snellen is in his office.  Follow Up Instructions:   I discussed the assessment and treatment plan with the patient. The patient was provided an opportunity to ask questions and all were answered. The patient agreed with the plan and demonstrated an understanding of the instructions.   The patient was advised to call back or seek an in-person evaluation if the symptoms worsen or if the condition fails to improve as anticipated.  Total minutes including chart review and phone contact time: 17   HPI Kathy Ewing presents for [employer] hurt a lot all the time. Recently arms hurt also. Legs getting worse. Occasional leg swelling, but minimal. Work is a little less strenuous. Acid reflux medication not working HAving severe cramps with heavy menses. Needs referral. Has seen Dr. Glo Herring in [place] before. Would rather see him unless we can get her someone in [place].   Depression screen [facility] 2/9 01/24/2018 11/08/2017 09/08/2017  Decreased Interest 1 1 1   Down, Depressed, Hopeless 0 0 0  PHQ - 2 Score 1 1 1     History Kathy Ewing has a past medical history of Anxiety, B12 deficiency, Cholelithiasis, GERD (gastroesophageal reflux disease), Headache, High cholesterol, Numbness, Pneumonia, and Vitamin D deficiency.   Kathy Ewing has a past surgical history that includes Cesarean section and Cholecystectomy (N/A, 10/02/2014).   Her family history includes Anxiety disorder in her brother; Cerebral palsy in her daughter;  Depression in her daughter; Hypertension in her mother; Migraines in her sister; Multiple sclerosis in her sister.Kathy Ewing reports that Kathy Ewing has been smoking cigarettes. Kathy Ewing has a 15.00 pack-year smoking history. Kathy Ewing has never used smokeless tobacco. Kathy Ewing reports that Kathy Ewing does not drink alcohol or use drugs.    ROS Review of Systems  Constitutional: Negative.   HENT: Negative for congestion.   Eyes: Negative for visual disturbance.  Respiratory: Negative for shortness of breath.   Cardiovascular: Negative for chest pain.  Gastrointestinal: Negative for abdominal pain, constipation, diarrhea, nausea and vomiting.  Genitourinary: Negative for difficulty urinating.  Musculoskeletal: Positive for arthralgias and myalgias.  Neurological: Negative for headaches.  Psychiatric/Behavioral: Negative for sleep disturbance.    Objective:  There were no vitals taken for this visit.  BP Readings from Last 3 Encounters:  01/24/18 109/78  11/08/17 114/78  09/08/17 106/73    Wt Readings from Last 3 Encounters:  01/24/18 193 lb 2 oz (87.6 kg)  11/08/17 188 lb (85.3 kg)  09/08/17 193 lb (87.5 kg)     Physical Exam  Exam deferred. Pt. Harboring due to COVID 19. Phone visit performed.   Assessment & Plan:   Diagnoses and all orders for this visit:  Myositis of multiple sites, unspecified myositis type  Other orders -     predniSONE (DELTASONE) 10 MG tablet; Take 5 daily for 3 days followed by 4,3,2 and 1 for 3 days each. -     pantoprazole (PROTONIX) 40 MG tablet; Take 1 tablet (40 mg total) by mouth 2 (two) times daily.       I  have changed Kathy Ewing "Kathy Ewing"'s pantoprazole. I am also having her start on predniSONE. Additionally, I am having her maintain her lamoTRIgine, Vitamin D (Ergocalciferol), ondansetron, atorvastatin, rizatriptan, topiramate, clobetasol ointment, phentermine, ALPRAZolam, and divalproex. We will continue to administer cyanocobalamin.  Allergies as of  06/22/2018   No Known Allergies     Medication List       Accurate as of June 22, 2018 11:59 PM. If you have any questions, ask your nurse or doctor.        ALPRAZolam 0.5 MG tablet Commonly known as:  XANAX TAKE 1 OR 2 TABLETS BY MOUTH TWICE DAILY IF NEEDED FOR ANXIETY   atorvastatin 40 MG tablet Commonly known as:  LIPITOR Take 1 tablet (40 mg total) by mouth daily.   clobetasol ointment 0.05 % Commonly known as:  TEMOVATE Apply 1 application topically 2 (two) times daily.   divalproex 500 MG DR tablet Commonly known as:  DEPAKOTE TAKE 1 TABLET(500 MG) BY MOUTH TWICE DAILY   lamoTRIgine 25 MG tablet Commonly known as:  LAMICTAL   ondansetron 8 MG disintegrating tablet Commonly known as:  ZOFRAN-ODT Take 1 tablet (8 mg total) by mouth every 8 (eight) hours as needed for nausea or vomiting.   pantoprazole 40 MG tablet Commonly known as:  PROTONIX Take 1 tablet (40 mg total) by mouth 2 (two) times daily. What changed:  when to take this Changed by:  Claretta Fraise, MD   phentermine 37.5 MG capsule Take 1 capsule (37.5 mg total) by mouth 2 (two) times daily with breakfast and lunch.   predniSONE 10 MG tablet Commonly known as:  DELTASONE Take 5 daily for 3 days followed by 4,3,2 and 1 for 3 days each. Started by:  Claretta Fraise, MD   rizatriptan 10 MG tablet Commonly known as:  MAXALT TAKE 1 TABLET BY MOUTH ONCE DAILY IF NEEDED FOR MIGRAINES   topiramate 50 MG tablet Commonly known as:  TOPAMAX Take 1 tablet (50 mg total) by mouth at bedtime.   Vitamin D (Ergocalciferol) 1.25 MG (50000 UT) Caps capsule Commonly known as:  DRISDOL TAKE 1 CAPSULE BY MOUTH EVERY 7 DAYS        Follow-up: Return in about 6 weeks (around 08/03/2018).  Claretta Fraise, M.D.

## 2018-06-25 ENCOUNTER — Other Ambulatory Visit: Payer: Self-pay

## 2018-06-25 ENCOUNTER — Ambulatory Visit (INDEPENDENT_AMBULATORY_CARE_PROVIDER_SITE_OTHER): Payer: 59 | Admitting: *Deleted

## 2018-06-25 DIAGNOSIS — E538 Deficiency of other specified B group vitamins: Secondary | ICD-10-CM

## 2018-06-25 NOTE — Progress Notes (Signed)
Pt given Cyanocobalamin inj Tolerated well 

## 2018-07-06 ENCOUNTER — Other Ambulatory Visit: Payer: Self-pay

## 2018-07-06 ENCOUNTER — Ambulatory Visit (INDEPENDENT_AMBULATORY_CARE_PROVIDER_SITE_OTHER): Payer: 59 | Admitting: Family Medicine

## 2018-07-06 DIAGNOSIS — M609 Myositis, unspecified: Secondary | ICD-10-CM

## 2018-07-06 NOTE — Progress Notes (Signed)
    Subjective:    Patient ID: Kathy Ewing, female    DOB: [birth year], 48 y.o.   MRN: [id number]   HPI: Kathy Ewing is a 48 y.o. female presenting for bones hurting. Now in legs. Getting worse. Constant. Hands getting numb. More severe. Thought it was restless legs, but arms get it too. Sharp at times. Unbearable. Throbs, aches. Didn't start taking prednisone until 3 days ago.    Depression screen [facility] 2/9 01/24/2018 11/08/2017 09/08/2017 08/08/2017 07/26/2017  Decreased Interest 1 1 1 1 1   Down, Depressed, Hopeless 0 0 0 0 0  PHQ - 2 Score 1 1 1 1 1      Relevant past medical, surgical, family and social history reviewed and updated as indicated.  Interim medical history since our last visit reviewed. Allergies and medications reviewed and updated.  ROS:  Review of Systems  Constitutional: Positive for activity change, appetite change (decreased) and fatigue. Negative for fever.  Musculoskeletal: Positive for arthralgias, back pain, joint swelling and myalgias.     Social History   Tobacco Use  Smoking Status Current Every Day Smoker  . Packs/day: 0.50  . Years: 30.00  . Pack years: 15.00  . Types: Cigarettes  Smokeless Tobacco Never Used       Objective:     Wt Readings from Last 3 Encounters:  01/24/18 193 lb 2 oz (87.6 kg)  11/08/17 188 lb (85.3 kg)  09/08/17 193 lb (87.5 kg)     Exam deferred. Pt. Harboring due to COVID 19. Phone visit performed.   Assessment & Plan:   1. Myositis of multiple sites, unspecified myositis type     No orders of the defined types were placed in this encounter.   No orders of the defined types were placed in this encounter.     Diagnoses and all orders for this visit:  Myositis of multiple sites, unspecified myositis type   Continue prednisone. Given a few more days relief should be much better. If not, call. Virtual Visit via telephone Note  I discussed the limitations, risks, security and privacy concerns of  performing an evaluation and management service by telephone and the availability of in person appointments. The patient was identified with two identifiers. Pt.expressed understanding and agreed to proceed. Pt. Is at home. Dr. Livia Snellen is in his office.  Follow Up Instructions:   I discussed the assessment and treatment plan with the patient. The patient was provided an opportunity to ask questions and all were answered. The patient agreed with the plan and demonstrated an understanding of the instructions.   The patient was advised to call back or seek an in-person evaluation if the symptoms worsen or if the condition fails to improve as anticipated.   Total minutes including chart review and phone contact time: 10   Follow up plan: No follow-ups on file.  Claretta Fraise, MD [place]

## 2018-07-10 ENCOUNTER — Encounter: Payer: Self-pay | Admitting: Family Medicine

## 2018-07-25 ENCOUNTER — Ambulatory Visit: Payer: 59

## 2018-08-06 ENCOUNTER — Other Ambulatory Visit: Payer: Self-pay

## 2018-08-06 ENCOUNTER — Ambulatory Visit (INDEPENDENT_AMBULATORY_CARE_PROVIDER_SITE_OTHER): Payer: 59 | Admitting: Family Medicine

## 2018-08-06 ENCOUNTER — Other Ambulatory Visit: Payer: Self-pay | Admitting: Family Medicine

## 2018-08-06 ENCOUNTER — Encounter: Payer: Self-pay | Admitting: Family Medicine

## 2018-08-06 DIAGNOSIS — R519 Headache, unspecified: Secondary | ICD-10-CM | POA: Insufficient documentation

## 2018-08-06 DIAGNOSIS — J029 Acute pharyngitis, unspecified: Secondary | ICD-10-CM | POA: Diagnosis not present

## 2018-08-06 DIAGNOSIS — Z20822 Contact with and (suspected) exposure to covid-19: Secondary | ICD-10-CM

## 2018-08-06 DIAGNOSIS — B349 Viral infection, unspecified: Secondary | ICD-10-CM | POA: Insufficient documentation

## 2018-08-06 DIAGNOSIS — R6889 Other general symptoms and signs: Secondary | ICD-10-CM | POA: Diagnosis not present

## 2018-08-06 DIAGNOSIS — R197 Diarrhea, unspecified: Secondary | ICD-10-CM | POA: Diagnosis not present

## 2018-08-06 DIAGNOSIS — R51 Headache: Secondary | ICD-10-CM | POA: Diagnosis not present

## 2018-08-06 NOTE — Progress Notes (Signed)
Virtual Visit via telephone Note Due to COVID-19, visit is conducted virtually and was requested by patient. This visit type was conducted due to national recommendations for restrictions regarding the COVID-19 Pandemic (e.g. social distancing) in an effort to limit this patient's exposure and mitigate transmission in our community. All issues noted in this document were discussed and addressed.  A physical exam was not performed with this format.   I connected with Kathy Ewing on 08/06/18 at 65 by telephone and verified that I am speaking with the correct person using two identifiers. Kathy Ewing is currently located at home and no one is currently with them during visit. The provider, Monia Pouch, FNP is located in their office at time of visit.  I discussed the limitations, risks, security and privacy concerns of performing an evaluation and management service by telephone and the availability of in person appointments. I also discussed with the patient that there may be a patient responsible charge related to this service. The patient expressed understanding and agreed to proceed.  Subjective:  Patient ID: Kathy Ewing, female    DOB: [birth year], 48 y.o.   MRN: [id number]  Chief Complaint:  Headache, Diarrhea, and Sore Throat   HPI: Kathy Ewing is a 48 y.o. female presenting on 08/06/2018 for Headache, Diarrhea, and Sore Throat   Pt reports headache, sore throat, myalgias, and diarrhea. Pt states the headache started Thursday and has progressively worsened. States it is frontal and pressure like, 6/10. She states she developed a sore throat, myalgias, and diarrhea on Friday. States the diarrhea was watery at first and is now just loose stool. She has tried Tylenol with minimal relief of symptoms. She has been exposed to a coworker who tested positive for COVID-19. She denies fever or chills. No weakness, cough, chest pain, or shortness of breath.     Relevant past  medical, surgical, family, and social history reviewed and updated as indicated.  Allergies and medications reviewed and updated.   Past Medical History:  Diagnosis Date  . Anxiety   . B12 deficiency   . Cholelithiasis   . GERD (gastroesophageal reflux disease)   . Headache    migraine   . High cholesterol   . Numbness   . Pneumonia   . Vitamin D deficiency     Past Surgical History:  Procedure Laterality Date  . CESAREAN SECTION     x 2  . CHOLECYSTECTOMY N/A 10/02/2014   Procedure: LAPAROSCOPIC CHOLECYSTECTOMY;  Surgeon: Rolm Bookbinder, MD;  Location: [facility] OR;  Service: General;  Laterality: N/A;    Social History   Socioeconomic History  . Marital status: Married    Spouse name: Not on file  . Number of children: Not on file  . Years of education: Not on file  . Highest education level: Not on file  Occupational History  . Not on file  Social Needs  . Financial resource strain: Not on file  . Food insecurity    Worry: Not on file    Inability: Not on file  . Transportation needs    Medical: Not on file    Non-medical: Not on file  Tobacco Use  . Smoking status: Current Every Day Smoker    Packs/day: 0.50    Years: 30.00    Pack years: 15.00    Types: Cigarettes  . Smokeless tobacco: Never Used  Substance and Sexual Activity  . Alcohol use: No    Alcohol/week: 0.0 standard drinks  .  Drug use: No  . Sexual activity: Yes    Birth control/protection: Surgical  Lifestyle  . Physical activity    Days per week: Not on file    Minutes per session: Not on file  . Stress: Not on file  Relationships  . Social Herbalist on phone: Not on file    Gets together: Not on file    Attends religious service: Not on file    Active member of club or organization: Not on file    Attends meetings of clubs or organizations: Not on file    Relationship status: Not on file  . Intimate partner violence    Fear of current or ex partner: Not on file     Emotionally abused: Not on file    Physically abused: Not on file    Forced sexual activity: Not on file  Other Topics Concern  . Not on file  Social History Narrative   She works at [employer] as a Education officer, community.   Highest level education:  9th grade   She lives with husband, daughter, and son.     Outpatient Encounter Medications as of 08/06/2018  Medication Sig  . ALPRAZolam (XANAX) 0.5 MG tablet TAKE 1 OR 2 TABLETS BY MOUTH TWICE DAILY IF NEEDED FOR ANXIETY  . atorvastatin (LIPITOR) 40 MG tablet Take 1 tablet (40 mg total) by mouth daily.  . clobetasol ointment (TEMOVATE) 9.92 % Apply 1 application topically 2 (two) times daily.  . divalproex (DEPAKOTE) 500 MG DR tablet TAKE 1 TABLET(500 MG) BY MOUTH TWICE DAILY  . lamoTRIgine (LAMICTAL) 25 MG tablet   . ondansetron (ZOFRAN-ODT) 8 MG disintegrating tablet Take 1 tablet (8 mg total) by mouth every 8 (eight) hours as needed for nausea or vomiting.  . pantoprazole (PROTONIX) 40 MG tablet Take 1 tablet (40 mg total) by mouth 2 (two) times daily.  . phentermine 37.5 MG capsule Take 1 capsule (37.5 mg total) by mouth 2 (two) times daily with breakfast and lunch.  . predniSONE (DELTASONE) 10 MG tablet Take 5 daily for 3 days followed by 4,3,2 and 1 for 3 days each.  . rizatriptan (MAXALT) 10 MG tablet TAKE 1 TABLET BY MOUTH ONCE DAILY IF NEEDED FOR MIGRAINES  . topiramate (TOPAMAX) 50 MG tablet Take 1 tablet (50 mg total) by mouth at bedtime.  . Vitamin D, Ergocalciferol, (DRISDOL) 1.25 MG (50000 UT) CAPS capsule TAKE 1 CAPSULE BY MOUTH EVERY 7 DAYS   Facility-Administered Encounter Medications as of 08/06/2018  Medication  . cyanocobalamin ((VITAMIN B-12)) injection 1,000 mcg    No Known Allergies  Review of Systems  Constitutional: Positive for fatigue. Negative for chills and fever.  HENT: Positive for congestion, sinus pressure and sore throat. Negative for ear pain, postnasal drip, rhinorrhea, sinus pain and voice change.    Respiratory: Negative for cough, chest tightness and shortness of breath.   Cardiovascular: Negative for chest pain and palpitations.  Gastrointestinal: Positive for abdominal pain and diarrhea. Negative for anal bleeding, blood in stool, constipation, nausea and vomiting.  Genitourinary: Negative for decreased urine volume and difficulty urinating.  Musculoskeletal: Positive for arthralgias and myalgias.  Neurological: Positive for headaches. Negative for dizziness, weakness and light-headedness.  Psychiatric/Behavioral: Negative for confusion.  All other systems reviewed and are negative.        Observations/Objective: No vital signs or physical exam, this was a telephone or virtual health encounter.  Pt alert and oriented, answers all questions appropriately, and able to  speak in full sentences.    Assessment and Plan: Debralee was seen today for headache, diarrhea and sore throat.  Diagnoses and all orders for this visit:  Acute intractable headache, unspecified headache type Diarrhea in adult patient Sore throat Suspected Covid-19 Virus Infection Reported symptoms concerning for COVID-19 infection. Has been exposed coworker who tested positive. Symptomatic care discussed. Self quarantine measures discussed. Infection prevention discussed. Pt aware to report any new or worsening symptoms. Testing ordered and pt provided instructions on testing and location of testing sites. Report any new or worsening symptoms. Pt aware of symptoms that require emergent evaluation and treatment.  -     Novel Coronavirus, NAA ([facility]) -     MyChart COVID-19 home monitoring program; Future -     Temperature monitoring; Future     Follow Up Instructions: Return in about 4 weeks (around 09/03/2018), or if symptoms worsen or fail to improve, for migraine.    I discussed the assessment and treatment plan with the patient. The patient was provided an opportunity to ask questions and all were  answered. The patient agreed with the plan and demonstrated an understanding of the instructions.   The patient was advised to call back or seek an in-person evaluation if the symptoms worsen or if the condition fails to improve as anticipated.  The above assessment and management plan was discussed with the patient. The patient verbalized understanding of and has agreed to the management plan. Patient is aware to call the clinic if symptoms persist or worsen. Patient is aware when to return to the clinic for a follow-up visit. Patient educated on when it is appropriate to go to the emergency department.    I provided 25 minutes of non-face-to-face time during this encounter. The call started at 1005. The call ended at 1030. The other time was used for coordination of care.    Monia Pouch, FNP-C [facility] [address] [place], [place] [address] [phone]

## 2018-08-07 ENCOUNTER — Other Ambulatory Visit: Payer: No Typology Code available for payment source

## 2018-08-07 ENCOUNTER — Other Ambulatory Visit: Payer: Self-pay

## 2018-08-07 DIAGNOSIS — Z20822 Contact with and (suspected) exposure to covid-19: Secondary | ICD-10-CM

## 2018-08-09 LAB — NOVEL CORONAVIRUS, NAA: SARS-CoV-2, NAA: NOT DETECTED

## 2018-08-14 ENCOUNTER — Encounter: Payer: Self-pay | Admitting: Family Medicine

## 2018-08-14 ENCOUNTER — Telehealth: Payer: Self-pay | Admitting: Family Medicine

## 2018-08-14 ENCOUNTER — Ambulatory Visit (INDEPENDENT_AMBULATORY_CARE_PROVIDER_SITE_OTHER): Payer: 59 | Admitting: Family Medicine

## 2018-08-14 DIAGNOSIS — J069 Acute upper respiratory infection, unspecified: Secondary | ICD-10-CM

## 2018-08-14 DIAGNOSIS — R509 Fever, unspecified: Secondary | ICD-10-CM

## 2018-08-14 MED ORDER — PSEUDOEPH-BROMPHEN-DM 30-2-10 MG/5ML PO SYRP: 5.0000 mL | ORAL_SOLUTION | Freq: Four times a day (QID) | ORAL | 0 refills | Status: DC | PRN

## 2018-08-14 MED ORDER — AMOXICILLIN-POT CLAVULANATE 875-125 MG PO TABS: 1.0000 | ORAL_TABLET | Freq: Two times a day (BID) | ORAL | 0 refills | Status: AC

## 2018-08-14 NOTE — Progress Notes (Signed)
Virtual Visit via telephone Note Due to COVID-19 pandemic this visit was conducted virtually. This visit type was conducted due to national recommendations for restrictions regarding the COVID-19 Pandemic (e.g. social distancing, sheltering in place) in an effort to limit this patient's exposure and mitigate transmission in our community. All issues noted in this document were discussed and addressed.  A physical exam was not performed with this format.   I connected with Kathy Ewing on 08/14/18 at 1510 by telephone and verified that I am speaking with the correct person using two identifiers. Kathy Ewing is currently located at home and family is currently with them during visit. The provider, Monia Pouch, FNP is located in their office at time of visit.  I discussed the limitations, risks, security and privacy concerns of performing an evaluation and management service by telephone and the availability of in person appointments. I also discussed with the patient that there may be a patient responsible charge related to this service. The patient expressed understanding and agreed to proceed.  Subjective:  Patient ID: Kathy Ewing, female    DOB: 1970/08/23, 48 y.o.   MRN: [id number]  Chief Complaint:  Fever, Sore Throat, and Cough   HPI: Kathy Ewing is a 48 y.o. female presenting on 08/14/2018 for Fever, Sore Throat, and Cough   Pt reports continued fever, sore throat, cough, congestion, and headaches. Pt states this started on 08/06/2018. She was tested for COVID-19 with a negative result. Pt states her symptoms have continued to worsen. States she has had a fever of 100-101 for 4-5 days. She has been doing symptomatic care without relief of symptoms. She has been self quarantined since visit on 08/06/2018.   Fever  This is a new problem. The current episode started 1 to 4 weeks ago. The problem occurs daily. The problem has been gradually worsening. The temperature was taken  using an oral thermometer. Associated symptoms include congestion, coughing, ear pain, headaches and a sore throat. Pertinent negatives include no abdominal pain, chest pain, diarrhea, muscle aches, nausea, rash, sleepiness, urinary pain, vomiting or wheezing. She has tried acetaminophen, fluids, NSAIDs and cool baths for the symptoms. The treatment provided no relief.  Risk factors: no recent travel and no sick contacts   Sore Throat  This is a new problem. The current episode started in the past 7 days. The problem has been gradually worsening. Neither side of throat is experiencing more pain than the other. The maximum temperature recorded prior to her arrival was 101 - 101.9 F. The fever has been present for 3 to 4 days. The pain is at a severity of 6/10. The pain is moderate. Associated symptoms include congestion, coughing, ear pain, headaches, a plugged ear sensation and swollen glands. Pertinent negatives include no abdominal pain, diarrhea, drooling, ear discharge, hoarse voice, neck pain, shortness of breath, stridor, trouble swallowing or vomiting. She has tried acetaminophen and NSAIDs for the symptoms. The treatment provided mild relief.  Cough This is a new problem. The current episode started in the past 7 days. The problem has been gradually worsening. The problem occurs hourly. The cough is productive of sputum. Associated symptoms include chills, ear pain, a fever, headaches, nasal congestion, postnasal drip, rhinorrhea and a sore throat. Pertinent negatives include no chest pain, ear congestion, heartburn, hemoptysis, myalgias, rash, shortness of breath, sweats, weight loss or wheezing. She has tried rest for the symptoms. The treatment provided no relief.     Relevant past medical, surgical,  family, and social history reviewed and updated as indicated.  Allergies and medications reviewed and updated.   Past Medical History:  Diagnosis Date  . Anxiety   . B12 deficiency   .  Cholelithiasis   . GERD (gastroesophageal reflux disease)   . Headache    migraine   . High cholesterol   . Numbness   . Pneumonia   . Vitamin D deficiency     Past Surgical History:  Procedure Laterality Date  . CESAREAN SECTION     x 2  . CHOLECYSTECTOMY N/A 10/02/2014   Procedure: LAPAROSCOPIC CHOLECYSTECTOMY;  Surgeon: Rolm Bookbinder, MD;  Location: [facility] OR;  Service: General;  Laterality: N/A;    Social History   Socioeconomic History  . Marital status: Married    Spouse name: Not on file  . Number of children: Not on file  . Years of education: Not on file  . Highest education level: Not on file  Occupational History  . Not on file  Social Needs  . Financial resource strain: Not on file  . Food insecurity    Worry: Not on file    Inability: Not on file  . Transportation needs    Medical: Not on file    Non-medical: Not on file  Tobacco Use  . Smoking status: Current Every Day Smoker    Packs/day: 0.50    Years: 30.00    Pack years: 15.00    Types: Cigarettes  . Smokeless tobacco: Never Used  Substance and Sexual Activity  . Alcohol use: No    Alcohol/week: 0.0 standard drinks  . Drug use: No  . Sexual activity: Yes    Birth control/protection: Surgical  Lifestyle  . Physical activity    Days per week: Not on file    Minutes per session: Not on file  . Stress: Not on file  Relationships  . Social Herbalist on phone: Not on file    Gets together: Not on file    Attends religious service: Not on file    Active member of club or organization: Not on file    Attends meetings of clubs or organizations: Not on file    Relationship status: Not on file  . Intimate partner violence    Fear of current or ex partner: Not on file    Emotionally abused: Not on file    Physically abused: Not on file    Forced sexual activity: Not on file  Other Topics Concern  . Not on file  Social History Narrative   She works at [employer] as a Education officer, community.    Highest level education:  9th grade   She lives with husband, daughter, and son.     Outpatient Encounter Medications as of 08/14/2018  Medication Sig  . ALPRAZolam (XANAX) 0.5 MG tablet TAKE 1 OR 2 TABLETS BY MOUTH TWICE DAILY IF NEEDED FOR ANXIETY  . amoxicillin-clavulanate (AUGMENTIN) 875-125 MG tablet Take 1 tablet by mouth 2 (two) times daily for 10 days.  [name] Kitchen atorvastatin (LIPITOR) 40 MG tablet Take 1 tablet (40 mg total) by mouth daily.  . brompheniramine-pseudoephedrine-DM 30-2-10 MG/5ML syrup Take 5 mLs by mouth 4 (four) times daily as needed.  . clobetasol ointment (TEMOVATE) 8.82 % Apply 1 application topically 2 (two) times daily.  . divalproex (DEPAKOTE) 500 MG DR tablet TAKE 1 TABLET(500 MG) BY MOUTH TWICE DAILY  . lamoTRIgine (LAMICTAL) 25 MG tablet   . ondansetron (ZOFRAN-ODT) 8 MG disintegrating tablet DISSOLVE  1 TABLET(8 MG) ON THE TONGUE EVERY 8 HOURS AS NEEDED FOR NAUSEA OR VOMITING  . pantoprazole (PROTONIX) 40 MG tablet Take 1 tablet (40 mg total) by mouth 2 (two) times daily.  . phentermine 37.5 MG capsule Take 1 capsule (37.5 mg total) by mouth 2 (two) times daily with breakfast and lunch.  . predniSONE (DELTASONE) 10 MG tablet Take 5 daily for 3 days followed by 4,3,2 and 1 for 3 days each.  . rizatriptan (MAXALT) 10 MG tablet TAKE 1 TABLET BY MOUTH ONCE DAILY IF NEEDED FOR MIGRAINES  . topiramate (TOPAMAX) 25 MG tablet   . topiramate (TOPAMAX) 50 MG tablet Take 1 tablet (50 mg total) by mouth at bedtime.  . Vitamin D, Ergocalciferol, (DRISDOL) 1.25 MG (50000 UT) CAPS capsule TAKE 1 CAPSULE BY MOUTH EVERY 7 DAYS   Facility-Administered Encounter Medications as of 08/14/2018  Medication  . cyanocobalamin ((VITAMIN B-12)) injection 1,000 mcg    No Known Allergies  Review of Systems  Constitutional: Positive for chills, fatigue and fever. Negative for activity change, appetite change, diaphoresis, unexpected weight change and weight loss.  HENT: Positive for  congestion, ear pain, postnasal drip, rhinorrhea and sore throat. Negative for drooling, ear discharge, hoarse voice, sinus pressure, sinus pain and trouble swallowing.   Eyes: Negative for photophobia and visual disturbance.  Respiratory: Positive for cough. Negative for hemoptysis, chest tightness, shortness of breath, wheezing and stridor.   Cardiovascular: Negative for chest pain and palpitations.  Gastrointestinal: Negative for abdominal pain, diarrhea, heartburn, nausea and vomiting.  Genitourinary: Negative for decreased urine volume, difficulty urinating and dysuria.  Musculoskeletal: Negative for arthralgias, myalgias and neck pain.  Skin: Negative for color change, pallor and rash.  Neurological: Positive for headaches. Negative for dizziness, syncope, weakness and light-headedness.  Psychiatric/Behavioral: Negative for confusion.  All other systems reviewed and are negative.        Observations/Objective: No vital signs or physical exam, this was a telephone or virtual health encounter.  Pt alert and oriented, answers all questions appropriately, and able to speak in full sentences.    Assessment and Plan: Kathy Ewing was seen today for fever, sore throat and cough.  Diagnoses and all orders for this visit:  URI with cough and congestion Fever in adult Reported symptoms consistent with URI. Pt was negative for COVID-19 8 days ago. She has had continued symptoms and no new travel or exposures. Due to ongoing fever and URI symptoms, will initiate Augmentin. Bromfed as needed for cough and congestion. Tylenol as needed for fever and pain control. Pt aware to report any new or worsening symptoms. Medications as prescribed. Follow up as needed.  -     amoxicillin-clavulanate (AUGMENTIN) 875-125 MG tablet; Take 1 tablet by mouth 2 (two) times daily for 10 days. -     brompheniramine-pseudoephedrine-DM 30-2-10 MG/5ML syrup; Take 5 mLs by mouth 4 (four) times daily as needed.      Follow Up Instructions: Return if symptoms worsen or fail to improve.    I discussed the assessment and treatment plan with the patient. The patient was provided an opportunity to ask questions and all were answered. The patient agreed with the plan and demonstrated an understanding of the instructions.   The patient was advised to call back or seek an in-person evaluation if the symptoms worsen or if the condition fails to improve as anticipated.  The above assessment and management plan was discussed with the patient. The patient verbalized understanding of and has agreed to the  management plan. Patient is aware to call the clinic if symptoms persist or worsen. Patient is aware when to return to the clinic for a follow-up visit. Patient educated on when it is appropriate to go to the emergency department.    I provided 15 minutes of non-face-to-face time during this encounter. The call started at 1510. The call ended at 1525. The other time was used for coordination of care.    Monia Pouch, FNP-C [facility] [address] [place], [place] [address] [phone] 08/14/18

## 2018-08-14 NOTE — Telephone Encounter (Signed)
Tele appt made for today

## 2018-08-20 ENCOUNTER — Encounter: Payer: Self-pay | Admitting: Family Medicine

## 2018-08-20 ENCOUNTER — Other Ambulatory Visit: Payer: Self-pay

## 2018-08-20 ENCOUNTER — Ambulatory Visit (INDEPENDENT_AMBULATORY_CARE_PROVIDER_SITE_OTHER): Payer: 59 | Admitting: Family Medicine

## 2018-08-20 DIAGNOSIS — R197 Diarrhea, unspecified: Secondary | ICD-10-CM

## 2018-08-20 MED ORDER — LOPERAMIDE HCL 2 MG PO TABS: 2.0000 mg | ORAL_TABLET | Freq: Four times a day (QID) | ORAL | 0 refills | Status: DC | PRN

## 2018-08-20 NOTE — Progress Notes (Signed)
Virtual Visit via telephone Note Due to COVID-19 pandemic this visit was conducted virtually. This visit type was conducted due to national recommendations for restrictions regarding the COVID-19 Pandemic (e.g. social distancing, sheltering in place) in an effort to limit this patient's exposure and mitigate transmission in our community. All issues noted in this document were discussed and addressed.  A physical exam was not performed with this format.   I connected with Kathy Ewing on 08/20/18 at 1230 by telephone and verified that I am speaking with the correct person using two identifiers. Kathy Ewing is currently located at home and family is currently with them during visit. The provider, Monia Pouch, FNP is located in their office at time of visit.  I discussed the limitations, risks, security and privacy concerns of performing an evaluation and management service by telephone and the availability of in person appointments. I also discussed with the patient that there may be a patient responsible charge related to this service. The patient expressed understanding and agreed to proceed.  Subjective:  Patient ID: Kathy Ewing, female    DOB: 22-Jul-1970, 48 y.o.   MRN: [id number]  Chief Complaint:  Diarrhea   HPI: Kathy Ewing is a 48 y.o. female presenting on 08/20/2018 for Diarrhea   Pt reports intermittent diarrhea. States onset several weeks ago. States she only has diarrhea after a heavy or greasy meal. States maybe once to twice a day. No melena or hematochezia. States she is unable to go back to work due to the ongoing symptoms. States she has not tried anything for the diarrhea. She states the diarrhea did not increase after starting the antibiotic. States it has remained the same. No fever, chills, confusion, or decreased urine output.   Diarrhea  This is a new problem. The current episode started 1 to 4 weeks ago. The problem occurs less than 2 times per day. The  problem has been waxing and waning. The patient states that diarrhea does not awaken her from sleep. Pertinent negatives include no abdominal pain, arthralgias, bloating, chills, coughing, fever, headaches, increased  flatus, myalgias, sweats, URI, vomiting or weight loss. Exacerbated by: eating. There are no known risk factors. She has tried nothing for the symptoms.     Relevant past medical, surgical, family, and social history reviewed and updated as indicated.  Allergies and medications reviewed and updated.   Past Medical History:  Diagnosis Date   Anxiety    B12 deficiency    Cholelithiasis    GERD (gastroesophageal reflux disease)    Headache    migraine    High cholesterol    Numbness    Pneumonia    Vitamin D deficiency     Past Surgical History:  Procedure Laterality Date   CESAREAN SECTION     x 2   CHOLECYSTECTOMY N/A 10/02/2014   Procedure: LAPAROSCOPIC CHOLECYSTECTOMY;  Surgeon: Rolm Bookbinder, MD;  Location: [facility] OR;  Service: General;  Laterality: N/A;    Social History   Socioeconomic History   Marital status: Married    Spouse name: Not on file   Number of children: Not on file   Years of education: Not on file   Highest education level: Not on file  Occupational History   Not on file  Social Needs   Financial resource strain: Not on file   Food insecurity    Worry: Not on file    Inability: Not on file   Transportation needs    Medical:  Not on file    Non-medical: Not on file  Tobacco Use   Smoking status: Current Every Day Smoker    Packs/day: 0.50    Years: 30.00    Pack years: 15.00    Types: Cigarettes   Smokeless tobacco: Never Used  Substance and Sexual Activity   Alcohol use: No    Alcohol/week: 0.0 standard drinks   Drug use: No   Sexual activity: Yes    Birth control/protection: Surgical  Lifestyle   Physical activity    Days per week: Not on file    Minutes per session: Not on file   Stress: Not  on file  Relationships   Social connections    Talks on phone: Not on file    Gets together: Not on file    Attends religious service: Not on file    Active member of club or organization: Not on file    Attends meetings of clubs or organizations: Not on file    Relationship status: Not on file   Intimate partner violence    Fear of current or ex partner: Not on file    Emotionally abused: Not on file    Physically abused: Not on file    Forced sexual activity: Not on file  Other Topics Concern   Not on file  Social History Narrative   She works at [employer] as a Education officer, community.   Highest level education:  9th grade   She lives with husband, daughter, and son.     Outpatient Encounter Medications as of 08/20/2018  Medication Sig   ALPRAZolam (XANAX) 0.5 MG tablet TAKE 1 OR 2 TABLETS BY MOUTH TWICE DAILY IF NEEDED FOR ANXIETY   amoxicillin-clavulanate (AUGMENTIN) 875-125 MG tablet Take 1 tablet by mouth 2 (two) times daily for 10 days.   atorvastatin (LIPITOR) 40 MG tablet Take 1 tablet (40 mg total) by mouth daily.   brompheniramine-pseudoephedrine-DM 30-2-10 MG/5ML syrup Take 5 mLs by mouth 4 (four) times daily as needed.   clobetasol ointment (TEMOVATE) 5.85 % Apply 1 application topically 2 (two) times daily.   divalproex (DEPAKOTE) 500 MG DR tablet TAKE 1 TABLET(500 MG) BY MOUTH TWICE DAILY   lamoTRIgine (LAMICTAL) 25 MG tablet    loperamide (IMODIUM A-D) 2 MG tablet Take 1 tablet (2 mg total) by mouth 4 (four) times daily as needed for diarrhea or loose stools.   ondansetron (ZOFRAN-ODT) 8 MG disintegrating tablet DISSOLVE 1 TABLET(8 MG) ON THE TONGUE EVERY 8 HOURS AS NEEDED FOR NAUSEA OR VOMITING   pantoprazole (PROTONIX) 40 MG tablet Take 1 tablet (40 mg total) by mouth 2 (two) times daily.   phentermine 37.5 MG capsule Take 1 capsule (37.5 mg total) by mouth 2 (two) times daily with breakfast and lunch.   predniSONE (DELTASONE) 10 MG tablet Take 5 daily for 3 days  followed by 4,3,2 and 1 for 3 days each.   rizatriptan (MAXALT) 10 MG tablet TAKE 1 TABLET BY MOUTH ONCE DAILY IF NEEDED FOR MIGRAINES   topiramate (TOPAMAX) 25 MG tablet    topiramate (TOPAMAX) 50 MG tablet Take 1 tablet (50 mg total) by mouth at bedtime.   Vitamin D, Ergocalciferol, (DRISDOL) 1.25 MG (50000 UT) CAPS capsule TAKE 1 CAPSULE BY MOUTH EVERY 7 DAYS   Facility-Administered Encounter Medications as of 08/20/2018  Medication   cyanocobalamin ((VITAMIN B-12)) injection 1,000 mcg    No Known Allergies  Review of Systems  Constitutional: Negative for activity change, appetite change, chills, diaphoresis, fatigue,  fever, unexpected weight change and weight loss.  Eyes: Negative for photophobia and visual disturbance.  Respiratory: Negative for cough and chest tightness.   Cardiovascular: Negative for chest pain and palpitations.  Gastrointestinal: Positive for diarrhea. Negative for abdominal pain, anal bleeding, bloating, blood in stool, flatus, nausea and vomiting.  Genitourinary: Negative for decreased urine volume and difficulty urinating.  Musculoskeletal: Negative for arthralgias and myalgias.  Skin: Negative for color change and pallor.  Neurological: Negative for dizziness, weakness, light-headedness and headaches.  Psychiatric/Behavioral: Negative for confusion.  All other systems reviewed and are negative.        Observations/Objective: No vital signs or physical exam, this was a telephone or virtual health encounter.  Pt alert and oriented, answers all questions appropriately, and able to speak in full sentences.    Assessment and Plan: Faron was seen today for diarrhea.  Diagnoses and all orders for this visit:  Diarrhea in adult patient Reported symptoms consistent with gastroenteritis. Pt has not tried anything for the diarrhea. No red flags. Discussed below diet. Can trial imodium to see if beneficial. Pt aware if symptoms persist or worsen, she  needs to be evaluated.   First 24 Hours-Clear liquids  Popsicles  Jello  Gatorade  Sprite Second 24 hours-Add Full liquids ( Liquids you can't see through) Third 24 hours- Bland diet ( foods that are baked or broiled), bananas, rice, applesauce, toast, etc.  Avoid fried foods and highly spiced foods During these 3 days  Avoid milk, cheese, ice cream, or any other dairy products  Avoid caffeine You should eat and drink in frequent small volumes.  If symptoms fail to improve or worsen in 2-3 days you should RETRUN TO OFFICE or go to the emergency department.  -     loperamide (IMODIUM A-D) 2 MG tablet; Take 1 tablet (2 mg total) by mouth 4 (four) times daily as needed for diarrhea or loose stools.     Follow Up Instructions: Return if symptoms worsen or fail to improve.    I discussed the assessment and treatment plan with the patient. The patient was provided an opportunity to ask questions and all were answered. The patient agreed with the plan and demonstrated an understanding of the instructions.   The patient was advised to call back or seek an in-person evaluation if the symptoms worsen or if the condition fails to improve as anticipated.  The above assessment and management plan was discussed with the patient. The patient verbalized understanding of and has agreed to the management plan. Patient is aware to call the clinic if symptoms persist or worsen. Patient is aware when to return to the clinic for a follow-up visit. Patient educated on when it is appropriate to go to the emergency department.    I provided 15 minutes of non-face-to-face time during this encounter. The call started at 1230. The call ended at 1245. The other time was used for coordination of care.    Monia Pouch, FNP-C [facility] [address] [place], [place] [address] [phone] 08/20/18

## 2018-08-21 ENCOUNTER — Telehealth: Payer: Self-pay | Admitting: Family Medicine

## 2018-08-21 NOTE — Telephone Encounter (Signed)
Please write as patient requests

## 2018-08-21 NOTE — Telephone Encounter (Signed)
Pt aware work note written and sent via MyChart for pt to print off.

## 2018-10-31 ENCOUNTER — Other Ambulatory Visit: Payer: Self-pay

## 2018-10-31 ENCOUNTER — Ambulatory Visit (INDEPENDENT_AMBULATORY_CARE_PROVIDER_SITE_OTHER): Payer: 59

## 2018-10-31 DIAGNOSIS — E538 Deficiency of other specified B group vitamins: Secondary | ICD-10-CM

## 2018-10-31 NOTE — Progress Notes (Signed)
Cyanocobalamin injection given to left deltoid.  Patient tolerated well. 

## 2018-11-01 ENCOUNTER — Other Ambulatory Visit: Payer: Self-pay | Admitting: Family Medicine

## 2018-11-04 ENCOUNTER — Other Ambulatory Visit: Payer: Self-pay | Admitting: Family Medicine

## 2018-11-06 ENCOUNTER — Other Ambulatory Visit: Payer: Self-pay | Admitting: *Deleted

## 2018-11-06 MED ORDER — ATORVASTATIN CALCIUM 40 MG PO TABS: 40.0000 mg | ORAL_TABLET | Freq: Every day | ORAL | 0 refills | Status: DC

## 2018-11-21 ENCOUNTER — Ambulatory Visit: Payer: 59 | Admitting: Family Medicine

## 2018-11-29 ENCOUNTER — Other Ambulatory Visit: Payer: Self-pay

## 2018-11-29 ENCOUNTER — Ambulatory Visit (INDEPENDENT_AMBULATORY_CARE_PROVIDER_SITE_OTHER): Payer: 59

## 2018-11-29 DIAGNOSIS — R197 Diarrhea, unspecified: Secondary | ICD-10-CM

## 2018-11-29 DIAGNOSIS — Z23 Encounter for immunization: Secondary | ICD-10-CM

## 2018-11-29 DIAGNOSIS — E538 Deficiency of other specified B group vitamins: Secondary | ICD-10-CM

## 2018-11-29 NOTE — Progress Notes (Signed)
Cyanocobalamin injection given to right deltoid.  Patient tolerated well. 

## 2018-12-27 ENCOUNTER — Ambulatory Visit: Payer: 59

## 2019-01-03 ENCOUNTER — Ambulatory Visit (INDEPENDENT_AMBULATORY_CARE_PROVIDER_SITE_OTHER): Payer: Self-pay | Admitting: Family Medicine

## 2019-01-03 ENCOUNTER — Encounter: Payer: Self-pay | Admitting: Family Medicine

## 2019-01-03 DIAGNOSIS — F419 Anxiety disorder, unspecified: Secondary | ICD-10-CM

## 2019-01-03 DIAGNOSIS — R519 Headache, unspecified: Secondary | ICD-10-CM

## 2019-01-03 MED ORDER — BUTALBITAL-APAP-CAFFEINE 50-300-40 MG PO CAPS: 1.0000 | ORAL_CAPSULE | ORAL | 1 refills | Status: DC | PRN

## 2019-01-03 MED ORDER — TOPIRAMATE 50 MG PO TABS: 50.0000 mg | ORAL_TABLET | ORAL | 1 refills | Status: DC

## 2019-01-03 MED ORDER — SUMATRIPTAN SUCCINATE 100 MG PO TABS: ORAL_TABLET | ORAL | 5 refills | Status: DC

## 2019-01-03 MED ORDER — DIVALPROEX SODIUM 500 MG PO DR TAB: DELAYED_RELEASE_TABLET | ORAL | 1 refills | Status: DC

## 2019-01-03 MED ORDER — ALPRAZOLAM 0.5 MG PO TABS: ORAL_TABLET | ORAL | 5 refills | Status: DC

## 2019-01-03 NOTE — Progress Notes (Signed)
Subjective:    Patient ID: Kathy Ewing, female    DOB: 06-23-1970, 48 y.o.   MRN: [id number]   HPI: Kathy Ewing is a 48 y.o. female presenting for headache for 5 days. Staying in cool quiet room. Hasn't been able to work. Pain is all over her head. Back of neck too. Throbbing, sharp intermittently. Feels like head will explode. Some cough and diarrhea, sweating some. Not sure about fever.Vomiting as well. Most recent was three hours ago.  Maxalt didn't help. Topiramate kept her awake. Hasn't tried to take it in the morning.    Depression screen [facility] 2/9 01/24/2018 11/08/2017 09/08/2017 08/08/2017 07/26/2017  Decreased Interest 1 1 1 1 1   Down, Depressed, Hopeless 0 0 0 0 0  PHQ - 2 Score 1 1 1 1 1      Relevant past medical, surgical, family and social history reviewed and updated as indicated.  Interim medical history since our last visit reviewed. Allergies and medications reviewed and updated.  ROS:  Review of Systems  Constitutional: Positive for activity change, appetite change, diaphoresis and fatigue.  HENT: Positive for congestion.   Eyes: Negative for visual disturbance.  Respiratory: Positive for cough. Negative for shortness of breath.   Cardiovascular: Negative for chest pain.  Gastrointestinal: Positive for diarrhea. Negative for abdominal pain.  Musculoskeletal: Negative for arthralgias.  Neurological: Positive for weakness, light-headedness and headaches. Negative for syncope.     Social History   Tobacco Use  Smoking Status Current Every Day Smoker  . Packs/day: 0.50  . Years: 30.00  . Pack years: 15.00  . Types: Cigarettes  Smokeless Tobacco Never Used       Objective:     Wt Readings from Last 3 Encounters:  01/24/18 193 lb 2 oz (87.6 kg)  11/08/17 188 lb (85.3 kg)  09/08/17 193 lb (87.5 kg)     Exam deferred. Pt. Harboring due to COVID 19. Phone visit performed.   Pt. Distraught, in distress from pain. Apparent even over the  phone  Assessment & Plan:   1. Acute intractable headache, unspecified headache type   2. Anxiety     Meds ordered this encounter  Medications  . topiramate (TOPAMAX) 50 MG tablet    Sig: Take 1 tablet (50 mg total) by mouth every morning.    Dispense:  90 tablet    Refill:  1  . divalproex (DEPAKOTE) 500 MG DR tablet    Sig: TAKE 1 TABLET(500 MG) BY MOUTH TWICE DAILY    Dispense:  180 tablet    Refill:  1  . ALPRAZolam (XANAX) 0.5 MG tablet    Sig: TAKE 1 OR 2 TABLETS BY MOUTH TWICE DAILY IF NEEDED FOR ANXIETY    Dispense:  60 tablet    Refill:  5  . Butalbital-APAP-Caffeine (FIORICET) 50-300-40 MG CAPS    Sig: Take 1 capsule by mouth every 4 (four) hours as needed (headache).    Dispense:  20 capsule    Refill:  1  . SUMAtriptan (IMITREX) 100 MG tablet    Sig: Take one at onset of headache. May repeat in 2 hours if headache persists or recurs. Max two per 24 hours    Dispense:  12 tablet    Refill:  5    Orders Placed This Encounter  Procedures  . COVID    Order Specific Question:   Is this test for diagnosis or screening    Answer:   Diagnosis of ill patient  Order Specific Question:   Symptomatic for COVID-19 as defined by CDC    Answer:   Yes    Order Specific Question:   Date of Symptom Onset    Answer:   12/30/2018    Order Specific Question:   Hospitalized for COVID-19    Answer:   No    Order Specific Question:   Admitted to ICU for COVID-19    Answer:   No    Order Specific Question:   Resident in a congregate (group) care setting    Answer:   No    Order Specific Question:   Is the patient student?    Answer:   No    Order Specific Question:   Employed in healthcare setting    Answer:   No    Order Specific Question:   Pregnant    Answer:   No    Order Specific Question:   Previously tested for COVID-19    Answer:   Yes    Order Specific Question:   Release to patient    Answer:   Immediate      Diagnoses and all orders for this visit:  Acute  intractable headache, unspecified headache type -     COVID  Anxiety -     ALPRAZolam (XANAX) 0.5 MG tablet; TAKE 1 OR 2 TABLETS BY MOUTH TWICE DAILY IF NEEDED FOR ANXIETY  Other orders -     topiramate (TOPAMAX) 50 MG tablet; Take 1 tablet (50 mg total) by mouth every morning. -     divalproex (DEPAKOTE) 500 MG DR tablet; TAKE 1 TABLET(500 MG) BY MOUTH TWICE DAILY -     Butalbital-APAP-Caffeine (FIORICET) 50-300-40 MG CAPS; Take 1 capsule by mouth every 4 (four) hours as needed (headache). -     SUMAtriptan (IMITREX) 100 MG tablet; Take one at onset of headache. May repeat in 2 hours if headache persists or recurs. Max two per 24 hours    Virtual Visit via telephone Note  I discussed the limitations, risks, security and privacy concerns of performing an evaluation and management service by telephone and the availability of in person appointments. The patient was identified with two identifiers. Pt.expressed understanding and agreed to proceed. Pt. Is at home. Dr. Livia Snellen is in his office.  Follow Up Instructions:   I discussed the assessment and treatment plan with the patient. The patient was provided an opportunity to ask questions and all were answered. The patient agreed with the plan and demonstrated an understanding of the instructions.   The patient was advised to call back or seek an in-person evaluation if the symptoms worsen or if the condition fails to improve as anticipated.   Total minutes including chart review and phone contact time: 29   Follow up plan: No follow-ups on file.  Claretta Fraise, MD [facility]

## 2019-01-04 ENCOUNTER — Telehealth: Payer: Self-pay | Admitting: Family Medicine

## 2019-01-04 NOTE — Telephone Encounter (Signed)
FYI

## 2019-01-08 ENCOUNTER — Telehealth: Payer: Self-pay | Admitting: Family Medicine

## 2019-01-08 NOTE — Telephone Encounter (Signed)
Patient says she tested negative for COVID and needs a doctors note stating that she is able to return to work.

## 2019-01-09 ENCOUNTER — Encounter: Payer: Self-pay | Admitting: Family Medicine

## 2019-01-09 NOTE — Telephone Encounter (Signed)
I sent it to myChart or it can be printed for her if she prefers. Thanks, WS

## 2019-01-09 NOTE — Telephone Encounter (Signed)
Pt has letter

## 2019-03-18 ENCOUNTER — Other Ambulatory Visit: Payer: Self-pay

## 2019-03-18 ENCOUNTER — Ambulatory Visit: Payer: Self-pay

## 2019-03-19 ENCOUNTER — Ambulatory Visit (INDEPENDENT_AMBULATORY_CARE_PROVIDER_SITE_OTHER): Payer: Managed Care, Other (non HMO) | Admitting: *Deleted

## 2019-03-19 DIAGNOSIS — E538 Deficiency of other specified B group vitamins: Secondary | ICD-10-CM | POA: Diagnosis not present

## 2019-03-19 NOTE — Patient Instructions (Signed)

## 2019-03-19 NOTE — Progress Notes (Signed)
Pt given B12 injection IM left deltoid and tolerated well. °

## 2019-07-12 ENCOUNTER — Other Ambulatory Visit: Payer: Self-pay | Admitting: Family Medicine

## 2019-07-15 NOTE — Telephone Encounter (Signed)
Last ov 01/24/18 for lipids-labs Has cancelled/no showed last few appts No scheduled follow up  ntbs for further refills

## 2019-08-12 ENCOUNTER — Ambulatory Visit: Payer: Managed Care, Other (non HMO) | Admitting: Family Medicine

## 2019-08-12 ENCOUNTER — Encounter: Payer: Self-pay | Admitting: Family Medicine

## 2019-08-12 ENCOUNTER — Encounter: Payer: Self-pay | Admitting: *Deleted

## 2019-08-12 ENCOUNTER — Other Ambulatory Visit: Payer: Self-pay

## 2019-08-12 VITALS — BP 130/82 | HR 67 | Temp 98.4°F | Ht 66.0 in | Wt 167.0 lb

## 2019-08-12 DIAGNOSIS — E782 Mixed hyperlipidemia: Secondary | ICD-10-CM | POA: Diagnosis not present

## 2019-08-12 DIAGNOSIS — R232 Flushing: Secondary | ICD-10-CM | POA: Diagnosis not present

## 2019-08-12 DIAGNOSIS — F419 Anxiety disorder, unspecified: Secondary | ICD-10-CM

## 2019-08-12 DIAGNOSIS — E538 Deficiency of other specified B group vitamins: Secondary | ICD-10-CM

## 2019-08-12 DIAGNOSIS — N921 Excessive and frequent menstruation with irregular cycle: Secondary | ICD-10-CM

## 2019-08-12 DIAGNOSIS — G43709 Chronic migraine without aura, not intractable, without status migrainosus: Secondary | ICD-10-CM

## 2019-08-12 MED ORDER — DIVALPROEX SODIUM 500 MG PO DR TAB: DELAYED_RELEASE_TABLET | ORAL | 1 refills | Status: DC

## 2019-08-12 MED ORDER — MEDROXYPROGESTERONE ACETATE 10 MG PO TABS: 10.0000 mg | ORAL_TABLET | Freq: Every day | ORAL | 0 refills | Status: DC

## 2019-08-12 MED ORDER — ALPRAZOLAM 0.5 MG PO TABS: ORAL_TABLET | ORAL | 5 refills | Status: DC

## 2019-08-12 MED ORDER — ATORVASTATIN CALCIUM 40 MG PO TABS: 40.0000 mg | ORAL_TABLET | Freq: Every day | ORAL | 0 refills | Status: DC

## 2019-08-12 MED ORDER — SUMATRIPTAN SUCCINATE 100 MG PO TABS: ORAL_TABLET | ORAL | 5 refills | Status: DC

## 2019-08-12 MED ORDER — MIRTAZAPINE 15 MG PO TABS: 15.0000 mg | ORAL_TABLET | Freq: Every day | ORAL | 2 refills | Status: DC

## 2019-08-12 NOTE — Progress Notes (Signed)
Subjective:  Patient ID: Kathy Ewing, female    DOB: 1970-04-06  Age: 49 y.o. MRN: [id number]  CC: Headache and Heavy Menstrual Cycle   HPI Kathy Ewing presents for heavy menses now coming twice a month for the last few months.  She is having episodes of getting hot all over.  Feels like her body is on fire she is burning so badly.  It will continue with a headache like a migraine that has lasted up to 2 days.  It is all over throbbing sensation.  She has discontinued most of her medications and has lost approximately 50 pounds.  She is no longer taking Topamax because it interfered with sleep.  She is no longer taking Depakote or Lamictal as well.  She has trouble with sleeping.  She only gets 2 to 3 hours a night.  Patient notes that she has occasional palpitations.  Not associated with chest pain shortness of breath.  Depression screen [facility] 2/9 08/12/2019 01/24/2018 11/08/2017  Decreased Interest 1 1 1   Down, Depressed, Hopeless 0 0 0  PHQ - 2 Score 1 1 1     History Kathy Ewing has a past medical history of Anxiety, B12 deficiency, Cholelithiasis, GERD (gastroesophageal reflux disease), Headache, High cholesterol, Numbness, Pneumonia, and Vitamin D deficiency.   She has a past surgical history that includes Cesarean section and Cholecystectomy (N/A, 10/02/2014).   Her family history includes Anxiety disorder in her brother; Cerebral palsy in her daughter; Depression in her daughter; Hypertension in her mother; Migraines in her sister; Multiple sclerosis in her sister.She reports that she has been smoking cigarettes. She has a 15.00 pack-year smoking history. She has never used smokeless tobacco. She reports that she does not drink alcohol and does not use drugs.    ROS Review of Systems  Constitutional: Negative.   HENT: Negative.   Eyes: Negative for visual disturbance.  Respiratory: Negative for shortness of breath.   Cardiovascular: Positive for palpitations. Negative for chest  pain.  Gastrointestinal: Negative for abdominal pain.  Genitourinary: Positive for menstrual problem.  Neurological: Positive for headaches.    Objective:  BP (!) 130/82   Pulse 67   Temp 98.4 F (36.9 C) (Temporal)   Ht 5' 6"  (1.676 m)   Wt 167 lb (75.8 kg)   BMI 26.95 kg/m   BP Readings from Last 3 Encounters:  08/12/19 (!) 130/82  01/24/18 109/78  11/08/17 114/78    Wt Readings from Last 3 Encounters:  08/12/19 167 lb (75.8 kg)  01/24/18 193 lb 2 oz (87.6 kg)  11/08/17 188 lb (85.3 kg)     Physical Exam Constitutional:      General: She is not in acute distress.    Appearance: She is well-developed.  Cardiovascular:     Rate and Rhythm: Normal rate and regular rhythm.  Pulmonary:     Breath sounds: Normal breath sounds.  Skin:    General: Skin is warm and dry.  Neurological:     General: No focal deficit present.     Mental Status: She is alert and oriented to person, place, and time.     Motor: Motor function is intact.     Coordination: Coordination is intact.       Assessment & Plan:   Kathy Ewing was seen today for headache and heavy menstrual cycle.  Diagnoses and all orders for this visit:  Menorrhagia with irregular cycle -     FSH/LH -     Estrogens, total -  CBC with Differential/Platelet -     CMP14+EGFR -     medroxyPROGESTERone (PROVERA) 10 MG tablet; Take 1 tablet (10 mg total) by mouth daily. Until gone  Anxiety -     CBC with Differential/Platelet -     CMP14+EGFR -     mirtazapine (REMERON) 15 MG tablet; Take 1 tablet (15 mg total) by mouth at bedtime. For sleep -     ALPRAZolam (XANAX) 0.5 MG tablet; TAKE 1 OR 2 TABLETS BY MOUTH TWICE DAILY IF NEEDED FOR ANXIETY  Hot flashes -     FSH/LH -     Estrogens, total -     CBC with Differential/Platelet -     CMP14+EGFR -     TSH  Mixed hyperlipidemia -     CBC with Differential/Platelet -     CMP14+EGFR -     atorvastatin (LIPITOR) 40 MG tablet; Take 1 tablet (40 mg total) by  mouth daily at 6 PM. (Needs to make 6 mos apptl)  Chronic migraine without aura without status migrainosus, not intractable -     CBC with Differential/Platelet -     CMP14+EGFR -     divalproex (DEPAKOTE) 500 MG DR tablet; TAKE 1 TABLET(500 MG) BY MOUTH TWICE DAILY to prevent migraine -     SUMAtriptan (IMITREX) 100 MG tablet; Take one at onset of headache. May repeat in 2 hours if headache persists or recurs. Max two per 24 hours       I have discontinued Kathy D. Berline Lopes "[name]"'s lamoTRIgine, clobetasol ointment, pantoprazole, ondansetron, brompheniramine-pseudoephedrine-DM, loperamide, topiramate, and Butalbital-APAP-Caffeine. I have also changed her divalproex. Additionally, I am having her start on medroxyPROGESTERone and mirtazapine. Lastly, I am having her maintain her Vitamin D (Ergocalciferol), ALPRAZolam, atorvastatin, and SUMAtriptan. We administered cyanocobalamin. We will continue to administer cyanocobalamin.  Allergies as of 08/12/2019   No Known Allergies     Medication List       Accurate as of August 12, 2019  2:32 PM. If you have any questions, ask your nurse or doctor.        STOP taking these medications   brompheniramine-pseudoephedrine-DM 30-2-10 MG/5ML syrup Stopped by: Claretta Fraise, MD   Butalbital-APAP-Caffeine 50-300-40 MG Caps Commonly known as: Fioricet Stopped by: Claretta Fraise, MD   clobetasol ointment 0.05 % Commonly known as: TEMOVATE Stopped by: Claretta Fraise, MD   lamoTRIgine 25 MG tablet Commonly known as: LAMICTAL Stopped by: Claretta Fraise, MD   loperamide 2 MG tablet Commonly known as: Imodium A-D Stopped by: Claretta Fraise, MD   ondansetron 8 MG disintegrating tablet Commonly known as: ZOFRAN-ODT Stopped by: Claretta Fraise, MD   pantoprazole 40 MG tablet Commonly known as: PROTONIX Stopped by: Claretta Fraise, MD   topiramate 50 MG tablet Commonly known as: TOPAMAX Stopped by: Claretta Fraise, MD     TAKE these medications    ALPRAZolam 0.5 MG tablet Commonly known as: XANAX TAKE 1 OR 2 TABLETS BY MOUTH TWICE DAILY IF NEEDED FOR ANXIETY   atorvastatin 40 MG tablet Commonly known as: LIPITOR Take 1 tablet (40 mg total) by mouth daily at 6 PM. (Needs to make 6 mos apptl)   divalproex 500 MG DR tablet Commonly known as: DEPAKOTE TAKE 1 TABLET(500 MG) BY MOUTH TWICE DAILY to prevent migraine What changed: additional instructions Changed by: Claretta Fraise, MD   medroxyPROGESTERone 10 MG tablet Commonly known as: PROVERA Take 1 tablet (10 mg total) by mouth daily. Until gone Started by: Claretta Fraise, MD  mirtazapine 15 MG tablet Commonly known as: Remeron Take 1 tablet (15 mg total) by mouth at bedtime. For sleep Started by: Claretta Fraise, MD   SUMAtriptan 100 MG tablet Commonly known as: IMITREX Take one at onset of headache. May repeat in 2 hours if headache persists or recurs. Max two per 24 hours   Vitamin D (Ergocalciferol) 1.25 MG (50000 UNIT) Caps capsule Commonly known as: DRISDOL TAKE 1 CAPSULE BY MOUTH EVERY 7 DAYS        Follow-up: Return in about 6 weeks (around 09/23/2019).  Claretta Fraise, M.D.

## 2019-08-14 LAB — CBC WITH DIFFERENTIAL/PLATELET
Basophils Absolute: 0.1 10*3/uL (ref 0.0–0.2)
Basos: 1 %
EOS (ABSOLUTE): 0.2 10*3/uL (ref 0.0–0.4)
Eos: 2 %
Hematocrit: 39.5 % (ref 34.0–46.6)
Hemoglobin: 12.6 g/dL (ref 11.1–15.9)
Immature Grans (Abs): 0.1 10*3/uL (ref 0.0–0.1)
Immature Granulocytes: 1 %
Lymphocytes Absolute: 1.7 10*3/uL (ref 0.7–3.1)
Lymphs: 22 %
MCH: 27.5 pg (ref 26.6–33.0)
MCHC: 31.9 g/dL (ref 31.5–35.7)
MCV: 86 fL (ref 79–97)
Monocytes Absolute: 0.6 10*3/uL (ref 0.1–0.9)
Monocytes: 8 %
Neutrophils Absolute: 5.1 10*3/uL (ref 1.4–7.0)
Neutrophils: 66 %
Platelets: 407 10*3/uL (ref 150–450)
RBC: 4.59 x10E6/uL (ref 3.77–5.28)
RDW: 14.8 % (ref 11.7–15.4)
WBC: 7.7 10*3/uL (ref 3.4–10.8)

## 2019-08-14 LAB — CMP14+EGFR
ALT: 11 IU/L (ref 0–32)
AST: 11 IU/L (ref 0–40)
Albumin/Globulin Ratio: 2 (ref 1.2–2.2)
Albumin: 4.3 g/dL (ref 3.8–4.8)
Alkaline Phosphatase: 94 IU/L (ref 48–121)
BUN/Creatinine Ratio: 9 (ref 9–23)
BUN: 7 mg/dL (ref 6–24)
Bilirubin Total: 0.4 mg/dL (ref 0.0–1.2)
CO2: 22 mmol/L (ref 20–29)
Calcium: 8.9 mg/dL (ref 8.7–10.2)
Chloride: 104 mmol/L (ref 96–106)
Creatinine, Ser: 0.78 mg/dL (ref 0.57–1.00)
GFR calc Af Amer: 105 mL/min/{1.73_m2} (ref >59)
GFR calc non Af Amer: 91 mL/min/{1.73_m2} (ref >59)
Globulin, Total: 2.2 g/dL (ref 1.5–4.5)
Glucose: 90 mg/dL (ref 65–99)
Potassium: 4.3 mmol/L (ref 3.5–5.2)
Sodium: 137 mmol/L (ref 134–144)
Total Protein: 6.5 g/dL (ref 6.0–8.5)

## 2019-08-14 LAB — TSH: TSH: 2.1 u[IU]/mL (ref 0.450–4.500)

## 2019-08-14 LAB — FSH/LH
FSH: 10.7 m[IU]/mL
LH: 12.2 m[IU]/mL

## 2019-08-14 LAB — ESTROGENS, TOTAL: Estrogen: 397 pg/mL

## 2019-08-14 NOTE — Progress Notes (Signed)
Hello Kathy Ewing,  Your lab result is normal and/or stable.Some minor variations that are not significant are commonly marked abnormal, but do not represent any medical problem for you.  Best regards, Francies Inch, M.D.

## 2019-08-29 ENCOUNTER — Ambulatory Visit (INDEPENDENT_AMBULATORY_CARE_PROVIDER_SITE_OTHER): Payer: Managed Care, Other (non HMO) | Admitting: Family Medicine

## 2019-08-29 DIAGNOSIS — B9789 Other viral agents as the cause of diseases classified elsewhere: Secondary | ICD-10-CM | POA: Diagnosis not present

## 2019-08-29 DIAGNOSIS — J988 Other specified respiratory disorders: Secondary | ICD-10-CM

## 2019-08-29 MED ORDER — AZITHROMYCIN 250 MG PO TABS: ORAL_TABLET | ORAL | 0 refills | Status: DC

## 2019-08-29 MED ORDER — FLUTICASONE PROPIONATE 50 MCG/ACT NA SUSP: 1.0000 | Freq: Two times a day (BID) | NASAL | 6 refills | Status: DC | PRN

## 2019-08-29 NOTE — Progress Notes (Signed)
Virtual Visit via telephone Note  I connected with Kathy Ewing on 08/29/19 at 1239 by telephone and verified that I am speaking with the correct person using two identifiers. Kathy Ewing is currently located at home and no othe people are currently with her during visit. The provider, Fransisca Kaufmann Timberlee Roblero, MD is located in their office at time of visit.  Call ended at 1249  I discussed the limitations, risks, security and privacy concerns of performing an evaluation and management service by telephone and the availability of in person appointments. I also discussed with the patient that there may be a patient responsible charge related to this service. The patient expressed understanding and agreed to proceed.   History and Present Illness: Cough and diarrhea and fevers of 101-102, and cough and sore throat.  She awoke like this am at 3 with these symptoms.  She has a lot of cough and gets pain with coughing.  She denies shortness of breath or wheezing.  She works but does not know of any sick contacts.   She has taken tylenol and it helps with the fevers and then returns.  She has covid testing this morning.   No diagnosis found.  Outpatient Encounter Medications as of 08/29/2019  Medication Sig  . ALPRAZolam (XANAX) 0.5 MG tablet TAKE 1 OR 2 TABLETS BY MOUTH TWICE DAILY IF NEEDED FOR ANXIETY  . atorvastatin (LIPITOR) 40 MG tablet Take 1 tablet (40 mg total) by mouth daily at 6 PM. (Needs to make 6 mos apptl)  . divalproex (DEPAKOTE) 500 MG DR tablet TAKE 1 TABLET(500 MG) BY MOUTH TWICE DAILY to prevent migraine  . medroxyPROGESTERone (PROVERA) 10 MG tablet Take 1 tablet (10 mg total) by mouth daily. Until gone  . mirtazapine (REMERON) 15 MG tablet Take 1 tablet (15 mg total) by mouth at bedtime. For sleep  . SUMAtriptan (IMITREX) 100 MG tablet Take one at onset of headache. May repeat in 2 hours if headache persists or recurs. Max two per 24 hours  . Vitamin D, Ergocalciferol,  (DRISDOL) 1.25 MG (50000 UT) CAPS capsule TAKE 1 CAPSULE BY MOUTH EVERY 7 DAYS   Facility-Administered Encounter Medications as of 08/29/2019  Medication  . cyanocobalamin ((VITAMIN B-12)) injection 1,000 mcg    Review of Systems  Constitutional: Negative for chills and fever.  HENT: Negative for congestion, ear discharge and ear pain.   Eyes: Negative for redness and visual disturbance.  Respiratory: Negative for chest tightness and shortness of breath.   Cardiovascular: Negative for chest pain and leg swelling.  Genitourinary: Negative for difficulty urinating and dysuria.  Musculoskeletal: Negative for back pain and gait problem.  Skin: Negative for rash.  Neurological: Negative for light-headedness and headaches.  Psychiatric/Behavioral: Negative for agitation and behavioral problems.  All other systems reviewed and are negative.   Observations/Objective: Patient sounds hoarse but is breathing fine on the phone  Assessment and Plan: Problem List Items Addressed This Visit    None    Visit Diagnoses    Viral respiratory infection    -  Primary   Relevant Medications   azithromycin (ZITHROMAX) 250 MG tablet   fluticasone (FLONASE) 50 MCG/ACT nasal spray    Recommended quarantine, patient already had Covid testing, will do Z-Pak and Flonase and monitor closely.  She will let us know if she is Covid positive or not.  Follow up plan: Return if symptoms worsen or fail to improve.     I discussed the assessment and treatment  plan with the patient. The patient was provided an opportunity to ask questions and all were answered. The patient agreed with the plan and demonstrated an understanding of the instructions.   The patient was advised to call back or seek an in-person evaluation if the symptoms worsen or if the condition fails to improve as anticipated.  The above assessment and management plan was discussed with the patient. The patient verbalized understanding of and has  agreed to the management plan. Patient is aware to call the clinic if symptoms persist or worsen. Patient is aware when to return to the clinic for a follow-up visit. Patient educated on when it is appropriate to go to the emergency department.    I provided 10 minutes of non-face-to-face time during this encounter.    Worthy Rancher, MD

## 2019-11-06 ENCOUNTER — Other Ambulatory Visit: Payer: Self-pay | Admitting: Family Medicine

## 2019-11-06 DIAGNOSIS — E782 Mixed hyperlipidemia: Secondary | ICD-10-CM

## 2019-11-19 ENCOUNTER — Other Ambulatory Visit: Payer: Self-pay | Admitting: Family Medicine

## 2019-11-19 DIAGNOSIS — F419 Anxiety disorder, unspecified: Secondary | ICD-10-CM

## 2020-02-10 ENCOUNTER — Other Ambulatory Visit: Payer: Self-pay | Admitting: Family Medicine

## 2020-02-10 DIAGNOSIS — E782 Mixed hyperlipidemia: Secondary | ICD-10-CM

## 2020-02-18 ENCOUNTER — Other Ambulatory Visit: Payer: Self-pay | Admitting: Family Medicine

## 2020-02-18 DIAGNOSIS — F419 Anxiety disorder, unspecified: Secondary | ICD-10-CM

## 2020-02-20 ENCOUNTER — Encounter: Payer: Self-pay | Admitting: Family Medicine

## 2020-02-20 ENCOUNTER — Other Ambulatory Visit: Payer: Self-pay

## 2020-02-20 ENCOUNTER — Ambulatory Visit: Payer: Managed Care, Other (non HMO) | Admitting: Family Medicine

## 2020-02-20 VITALS — BP 117/79 | HR 71 | Temp 97.9°F | Ht 66.0 in | Wt 173.6 lb

## 2020-02-20 DIAGNOSIS — E538 Deficiency of other specified B group vitamins: Secondary | ICD-10-CM

## 2020-02-20 DIAGNOSIS — Z23 Encounter for immunization: Secondary | ICD-10-CM | POA: Diagnosis not present

## 2020-02-20 DIAGNOSIS — E782 Mixed hyperlipidemia: Secondary | ICD-10-CM | POA: Diagnosis not present

## 2020-02-20 DIAGNOSIS — G43709 Chronic migraine without aura, not intractable, without status migrainosus: Secondary | ICD-10-CM

## 2020-02-20 DIAGNOSIS — Z029 Encounter for administrative examinations, unspecified: Secondary | ICD-10-CM

## 2020-02-20 DIAGNOSIS — Z79899 Other long term (current) drug therapy: Secondary | ICD-10-CM | POA: Diagnosis not present

## 2020-02-20 DIAGNOSIS — F419 Anxiety disorder, unspecified: Secondary | ICD-10-CM | POA: Diagnosis not present

## 2020-02-20 LAB — CMP14+EGFR
ALT: 15 IU/L (ref 0–32)
AST: 17 IU/L (ref 0–40)
Albumin/Globulin Ratio: 1.5 (ref 1.2–2.2)
Albumin: 4.1 g/dL (ref 3.8–4.8)
Alkaline Phosphatase: 95 IU/L (ref 44–121)
BUN/Creatinine Ratio: 12 (ref 9–23)
BUN: 10 mg/dL (ref 6–24)
Bilirubin Total: 0.3 mg/dL (ref 0.0–1.2)
CO2: 23 mmol/L (ref 20–29)
Calcium: 9.1 mg/dL (ref 8.7–10.2)
Chloride: 106 mmol/L (ref 96–106)
Creatinine, Ser: 0.81 mg/dL (ref 0.57–1.00)
GFR calc Af Amer: 100 mL/min/{1.73_m2} (ref >59)
GFR calc non Af Amer: 87 mL/min/{1.73_m2} (ref >59)
Globulin, Total: 2.7 g/dL (ref 1.5–4.5)
Glucose: 91 mg/dL (ref 65–99)
Potassium: 4.9 mmol/L (ref 3.5–5.2)
Sodium: 141 mmol/L (ref 134–144)
Total Protein: 6.8 g/dL (ref 6.0–8.5)

## 2020-02-20 LAB — LIPID PANEL
Chol/HDL Ratio: 3.7 ratio (ref 0.0–4.4)
Cholesterol, Total: 184 mg/dL (ref 100–199)
HDL: 50 mg/dL (ref >39)
LDL Chol Calc (NIH): 122 mg/dL — ABNORMAL HIGH (ref 0–99)
Triglycerides: 61 mg/dL (ref 0–149)
VLDL Cholesterol Cal: 12 mg/dL (ref 5–40)

## 2020-02-20 MED ORDER — ALPRAZOLAM 0.5 MG PO TABS: ORAL_TABLET | ORAL | 5 refills | Status: DC

## 2020-02-20 MED ORDER — ATORVASTATIN CALCIUM 40 MG PO TABS: 40.0000 mg | ORAL_TABLET | Freq: Every day | ORAL | Status: DC

## 2020-02-20 NOTE — Progress Notes (Signed)
Subjective:  Patient ID: Kathy Ewing, female    DOB: [birth year]  Age: 50 y.o. MRN: [id number]  CC: Medical Management of Chronic Issues   HPI [name] presents for patient in for follow-up of elevated cholesterol. Doing well without complaints on current medication. Denies side effects of statin including myalgia and arthralgia and nausea. She went off the medication about a month ago.  This is because she is lost a great deal of weight and wonders if the weight loss has brought her cholesterol down so that she no longer needs it.. Also in today for liver function testing. Currently no chest pain, shortness of breath or other cardiovascular related symptoms noted.  She is also in today for follow-up on her chronic migraine headaches.  The Depakote was ineffective.  She tried it for several weeks without any resolution of her headache.  She is tried it on 2 different occasions without any relief.  Patient reports that she is using Xanax 1-3 times a week.  She has panic attacks describes as a severe feeling of anxiousness and feeling like she cannot breathe.  She will have some numbness and tingling as well.  Patient also reports low back pain with numbness going into the right lower extremity also having right lower extremity tingling.  GAD 7 : Generalized Anxiety Score 02/20/2020 08/12/2019 05/11/2018 05/17/2017  Nervous, Anxious, on Edge 1 3 3 3   Control/stop worrying 1 1 1 3   Worry too much - different things 1 2 2 3   Trouble relaxing 1 2 1 3   Restless 1 2 1 3   Easily annoyed or irritable 0 0 0 1  Afraid - awful might happen 1 1 1 3   Total GAD 7 Score 6 11 9 19   Anxiety Difficulty Somewhat difficult Somewhat difficult - Somewhat difficult      Depression screen [facility] 2/9 02/20/2020 08/12/2019 01/24/2018  Decreased Interest 1 1 1   Down, Depressed, Hopeless 0 0 0  PHQ - 2 Score 1 1 1     History Majestic has a past medical history of Anxiety, B12 deficiency, Cholelithiasis, GERD  (gastroesophageal reflux disease), Headache, High cholesterol, Numbness, Pneumonia, and Vitamin D deficiency.   She has a past surgical history that includes Cesarean section and Cholecystectomy (N/A, 10/02/2014).   Her family history includes Anxiety disorder in her brother; Cerebral palsy in her daughter; Depression in her daughter; Hypertension in her mother; Migraines in her sister; Multiple sclerosis in her sister.She reports that she has been smoking cigarettes. She has a 15.00 pack-year smoking history. She has never used smokeless tobacco. She reports that she does not drink alcohol and does not use drugs.    ROS Review of Systems  Constitutional: Negative.   HENT: Negative for congestion.   Eyes: Negative for visual disturbance.  Respiratory: Negative for shortness of breath.   Cardiovascular: Negative for chest pain.  Gastrointestinal: Negative for abdominal pain, constipation, diarrhea, nausea and vomiting.  Genitourinary: Negative for difficulty urinating.  Musculoskeletal: Positive for arthralgias, back pain and myalgias.  Neurological: Negative for headaches.  Psychiatric/Behavioral: Positive for sleep disturbance. The patient is nervous/anxious.     Objective:  BP 117/79   Pulse 71   Temp 97.9 F (36.6 C) (Temporal)   Ht 5' 6"  (1.676 m)   Wt 173 lb 9.6 oz (78.7 kg)   BMI 28.02 kg/m   BP Readings from Last 3 Encounters:  02/20/20 117/79  08/12/19 (!) 130/82  01/24/18 109/78    Wt Readings from Last  3 Encounters:  02/20/20 173 lb 9.6 oz (78.7 kg)  08/12/19 167 lb (75.8 kg)  01/24/18 193 lb 2 oz (87.6 kg)     Physical Exam Constitutional:      General: She is not in acute distress.    Appearance: She is well-developed.  HENT:     Head: Normocephalic and atraumatic.  Eyes:     Conjunctiva/sclera: Conjunctivae normal.     Pupils: Pupils are equal, round, and reactive to light.  Neck:     Thyroid: No thyromegaly.  Cardiovascular:     Rate and Rhythm:  Normal rate and regular rhythm.     Heart sounds: Normal heart sounds. No murmur heard.   Pulmonary:     Effort: Pulmonary effort is normal. No respiratory distress.     Breath sounds: Normal breath sounds. No wheezing or rales.  Abdominal:     General: Bowel sounds are normal. There is no distension.     Palpations: Abdomen is soft.     Tenderness: There is no abdominal tenderness.  Musculoskeletal:        General: Normal range of motion.     Cervical back: Normal range of motion and neck supple.  Lymphadenopathy:     Cervical: No cervical adenopathy.  Skin:    General: Skin is warm and dry.  Neurological:     Mental Status: She is alert and oriented to person, place, and time.  Psychiatric:        Behavior: Behavior normal.        Thought Content: Thought content normal.        Judgment: Judgment normal.       Assessment & Plan:   Kathy Ewing was seen today for medical management of chronic issues.  Diagnoses and all orders for this visit:  Chronic migraine without aura without status migrainosus, not intractable -     CMP14+EGFR -     Lipid panel  Anxiety -     ALPRAZolam (XANAX) 0.5 MG tablet; TAKE 1 OR 2 TABLETS BY MOUTH TWICE DAILY IF NEEDED FOR ANXIETY -     ToxASSURE Select 13 (MW), Urine -     CMP14+EGFR -     Lipid panel  Mixed hyperlipidemia -     atorvastatin (LIPITOR) 40 MG tablet; Take 1 tablet (40 mg total) by mouth daily. (Needs to be seen before next refill) -     CMP14+EGFR -     Lipid panel  Controlled substance agreement signed -     ToxASSURE Select 13 (MW), Urine  Need for immunization against influenza -     Flu Vaccine QUAD 36+ mos IM       I have discontinued Tempie D. Berline Lopes "[name]"'s Vitamin D (Ergocalciferol), medroxyPROGESTERone, divalproex, SUMAtriptan, azithromycin, fluticasone, and mirtazapine. I am also having her maintain her atorvastatin and ALPRAZolam. We administered cyanocobalamin. We will continue to administer  cyanocobalamin.  Allergies as of 02/20/2020   No Known Allergies     Medication List       Accurate as of February 20, 2020 11:59 PM. If you have any questions, ask your nurse or doctor.        STOP taking these medications   azithromycin 250 MG tablet Commonly known as: ZITHROMAX Stopped by: Claretta Fraise, MD   divalproex 500 MG DR tablet Commonly known as: DEPAKOTE Stopped by: Claretta Fraise, MD   fluticasone 50 MCG/ACT nasal spray Commonly known as: FLONASE Stopped by: Claretta Fraise, MD   medroxyPROGESTERone 10 MG tablet  Commonly known as: PROVERA Stopped by: Claretta Fraise, MD   mirtazapine 15 MG tablet Commonly known as: REMERON Stopped by: Claretta Fraise, MD   SUMAtriptan 100 MG tablet Commonly known as: IMITREX Stopped by: Claretta Fraise, MD   Vitamin D (Ergocalciferol) 1.25 MG (50000 UNIT) Caps capsule Commonly known as: DRISDOL Stopped by: Claretta Fraise, MD     TAKE these medications   ALPRAZolam 0.5 MG tablet Commonly known as: XANAX TAKE 1 OR 2 TABLETS BY MOUTH TWICE DAILY IF NEEDED FOR ANXIETY   atorvastatin 40 MG tablet Commonly known as: LIPITOR Take 1 tablet (40 mg total) by mouth daily. (Needs to be seen before next refill)        Follow-up: Return in about 3 months (around 05/19/2020).  Claretta Fraise, M.D.

## 2020-02-23 ENCOUNTER — Encounter: Payer: Self-pay | Admitting: Family Medicine

## 2020-02-26 LAB — TOXASSURE SELECT 13 (MW), URINE

## 2020-03-20 ENCOUNTER — Ambulatory Visit (INDEPENDENT_AMBULATORY_CARE_PROVIDER_SITE_OTHER): Payer: Managed Care, Other (non HMO)

## 2020-03-20 ENCOUNTER — Telehealth: Payer: Self-pay

## 2020-03-20 ENCOUNTER — Other Ambulatory Visit: Payer: Self-pay

## 2020-03-20 DIAGNOSIS — E538 Deficiency of other specified B group vitamins: Secondary | ICD-10-CM

## 2020-03-20 NOTE — Progress Notes (Signed)
B12 injection given in left deltoid. Pt tolerated well. Next months appt scheduled for 04/20/20 at 3:30pm.

## 2020-03-20 NOTE — Telephone Encounter (Signed)
REFERRAL REQUEST Telephone Note  Have you been seen at our office for this problem? Yes, 02/20/2020 (Advise that they may need an appointment with their PCP before a referral can be done)  Reason for Referral:headaches Referral discussed with patient: yes pt was seen by Dr. Livia Snellen and this was discussed at appt on 02/20/20 Best contact number of patient for referral team:  [phone]  Has patient been seen by a specialist for this issue before: yes a Neurologist in [place] and was told by Dr. Livia Snellen he would refer her there again  Patient provider preference for referral:  Patient location preference for referral: [place], [place] Pt is at work during the day please leave message on VM   Patient notified that referrals can take up to a week or longer to process. If they haven't heard anything within a week they should call back and speak with the referral department.     REFERRAL REQUEST Telephone Note  Have you been seen at our office for this problem? Yes on 02/20/2020 (Advise that they may need an appointment with their PCP before a referral can be done)  Reason for Referral: Back Numbness  Referral discussed with patient: yes  Best contact number of patient for referral team:   [phone] Has patient been seen by a specialist for this issue before: no Patient provider preference for referral: PT was told by Dr. Livia Snellen she would be scheduled for an MRI  Patient location preference for referral: no location preference   Patient notified that referrals can take up to a week or longer to process. If they haven't heard anything within a week they should call back and speak with the referral department.

## 2020-04-07 ENCOUNTER — Other Ambulatory Visit: Payer: Self-pay | Admitting: Family Medicine

## 2020-04-07 DIAGNOSIS — G43709 Chronic migraine without aura, not intractable, without status migrainosus: Secondary | ICD-10-CM

## 2020-04-07 NOTE — Telephone Encounter (Signed)
Referral placed, as requested WS 

## 2020-04-09 ENCOUNTER — Other Ambulatory Visit: Payer: Self-pay | Admitting: Family Medicine

## 2020-04-09 DIAGNOSIS — E782 Mixed hyperlipidemia: Secondary | ICD-10-CM

## 2020-04-20 ENCOUNTER — Ambulatory Visit (INDEPENDENT_AMBULATORY_CARE_PROVIDER_SITE_OTHER): Payer: Managed Care, Other (non HMO) | Admitting: *Deleted

## 2020-04-20 ENCOUNTER — Other Ambulatory Visit: Payer: Self-pay

## 2020-04-20 DIAGNOSIS — E538 Deficiency of other specified B group vitamins: Secondary | ICD-10-CM

## 2020-04-20 NOTE — Progress Notes (Signed)
Pt tolerated B12 well 

## 2020-04-28 ENCOUNTER — Other Ambulatory Visit: Payer: Self-pay | Admitting: Family Medicine

## 2020-04-28 DIAGNOSIS — G43709 Chronic migraine without aura, not intractable, without status migrainosus: Secondary | ICD-10-CM

## 2020-05-21 ENCOUNTER — Ambulatory Visit: Payer: Managed Care, Other (non HMO)

## 2020-05-22 ENCOUNTER — Ambulatory Visit: Payer: Managed Care, Other (non HMO)

## 2020-05-25 ENCOUNTER — Other Ambulatory Visit: Payer: Self-pay

## 2020-05-25 ENCOUNTER — Ambulatory Visit (INDEPENDENT_AMBULATORY_CARE_PROVIDER_SITE_OTHER): Payer: Managed Care, Other (non HMO)

## 2020-05-25 DIAGNOSIS — E538 Deficiency of other specified B group vitamins: Secondary | ICD-10-CM | POA: Diagnosis not present

## 2020-05-25 NOTE — Progress Notes (Signed)
Cyanocobalamin injection given to left deltoid.  Patient tolerated well. 

## 2020-06-25 ENCOUNTER — Ambulatory Visit: Payer: Managed Care, Other (non HMO)

## 2020-08-19 ENCOUNTER — Ambulatory Visit: Payer: Managed Care, Other (non HMO) | Admitting: Family Medicine

## 2021-02-15 ENCOUNTER — Ambulatory Visit: Payer: BC Managed Care – PPO | Admitting: Family Medicine

## 2021-02-15 ENCOUNTER — Encounter: Payer: Self-pay | Admitting: Family Medicine

## 2021-02-15 VITALS — BP 118/74 | HR 69 | Temp 98.0°F | Ht 66.0 in | Wt 179.0 lb

## 2021-02-15 DIAGNOSIS — M5416 Radiculopathy, lumbar region: Secondary | ICD-10-CM

## 2021-02-15 DIAGNOSIS — G8929 Other chronic pain: Secondary | ICD-10-CM | POA: Diagnosis not present

## 2021-02-15 DIAGNOSIS — N3090 Cystitis, unspecified without hematuria: Secondary | ICD-10-CM

## 2021-02-15 DIAGNOSIS — E782 Mixed hyperlipidemia: Secondary | ICD-10-CM

## 2021-02-15 LAB — URINALYSIS, COMPLETE
Bilirubin, UA: NEGATIVE
Glucose, UA: NEGATIVE
Ketones, UA: NEGATIVE
Leukocytes,UA: NEGATIVE
Nitrite, UA: NEGATIVE
Protein,UA: NEGATIVE
RBC, UA: NEGATIVE
Specific Gravity, UA: 1.01 (ref 1.005–1.030)
Urobilinogen, Ur: 0.2 mg/dL (ref 0.2–1.0)
pH, UA: 7 (ref 5.0–7.5)

## 2021-02-15 MED ORDER — ATORVASTATIN CALCIUM 40 MG PO TABS: 40.0000 mg | ORAL_TABLET | Freq: Every day | ORAL | 3 refills | Status: DC

## 2021-02-15 MED ORDER — FLUCONAZOLE 150 MG PO TABS: 150.0000 mg | ORAL_TABLET | Freq: Once | ORAL | 0 refills | Status: AC

## 2021-02-15 MED ORDER — SULFAMETHOXAZOLE-TRIMETHOPRIM 800-160 MG PO TABS: 1.0000 | ORAL_TABLET | Freq: Two times a day (BID) | ORAL | 0 refills | Status: DC

## 2021-02-15 NOTE — Progress Notes (Signed)
Subjective:  Patient ID: Kathy Ewing, female    DOB: 1970/04/27  Age: 51 y.o. MRN: [id number]  CC: Cystitis   HPI ELLENE BLOODSAW presents for low back pressure. Urine orange with strong odor. Onset 2 weeks ago. Back pain getting worse. 10/10 daily. Worst in the mornings. Onset several years ago. Now increasing numbness in back and legs - to the knees with tingling    Depression screen [facility] 2/9 02/15/2021 02/15/2021 02/20/2020  Decreased Interest 0 0 1  Down, Depressed, Hopeless 0 0 0  PHQ - 2 Score 0 0 1  Altered sleeping 1 - -  Tired, decreased energy 1 - -  Change in appetite 0 - -  Feeling bad or failure about yourself  0 - -  Trouble concentrating 0 - -  Moving slowly or fidgety/restless 0 - -  Suicidal thoughts 0 - -  PHQ-9 Score 2 - -  Difficult doing work/chores Not difficult at all - -    History Lillianne has a past medical history of Anxiety, B12 deficiency, Cholelithiasis, GERD (gastroesophageal reflux disease), Headache, High cholesterol, Numbness, Pneumonia, and Vitamin D deficiency.   She has a past surgical history that includes Cesarean section and Cholecystectomy (N/A, 10/02/2014).   Her family history includes Anxiety disorder in her brother; Cerebral palsy in her daughter; Depression in her daughter; Hypertension in her mother; Migraines in her sister; Multiple sclerosis in her sister.She reports that she has been smoking cigarettes. She has a 15.00 pack-year smoking history. She has never used smokeless tobacco. She reports that she does not drink alcohol and does not use drugs.    ROS Review of Systems  Constitutional: Negative.   HENT: Negative.    Eyes:  Negative for visual disturbance.  Respiratory:  Negative for shortness of breath.   Cardiovascular:  Negative for chest pain.  Gastrointestinal:  Negative for abdominal pain.  Musculoskeletal:  Negative for arthralgias.   Objective:  BP 118/74    Pulse 69    Temp 98 F (36.7 C)    Ht 5' 6" (1.676 m)     Wt 179 lb (81.2 kg)    SpO2 98%    BMI 28.89 kg/m   BP Readings from Last 3 Encounters:  02/15/21 118/74  02/20/20 117/79  08/12/19 (!) 130/82    Wt Readings from Last 3 Encounters:  02/15/21 179 lb (81.2 kg)  02/20/20 173 lb 9.6 oz (78.7 kg)  08/12/19 167 lb (75.8 kg)     Physical Exam Constitutional:      General: She is not in acute distress.    Appearance: She is well-developed.  Cardiovascular:     Rate and Rhythm: Normal rate and regular rhythm.  Pulmonary:     Breath sounds: Normal breath sounds.  Musculoskeletal:        General: Normal range of motion.  Skin:    General: Skin is warm and dry.  Neurological:     Mental Status: She is alert and oriented to person, place, and time.      Assessment & Plan:   Orissa was seen today for cystitis.  Diagnoses and all orders for this visit:  Chronic radicular lumbar pain -     MR LUMBAR SPINE WO CONTRAST; Future -     CBC with Differential/Platelet -     CMP14+EGFR  Cystitis -     Urinalysis, Complete -     Urine Culture  Mixed hyperlipidemia -     atorvastatin (LIPITOR) 40  MG tablet; Take 1 tablet (40 mg total) by mouth daily. -     Lipid panel  Other orders -     sulfamethoxazole-trimethoprim (BACTRIM DS) 800-160 MG tablet; Take 1 tablet by mouth 2 (two) times daily. -     fluconazole (DIFLUCAN) 150 MG tablet; Take 1 tablet (150 mg total) by mouth once for 1 dose. At onset of symptoms. Repeat at end of treatment       I am having Brion D. Berline Lopes "[name]" start on sulfamethoxazole-trimethoprim and fluconazole. I am also having her maintain her ALPRAZolam and atorvastatin. We will continue to administer cyanocobalamin.  Allergies as of 02/15/2021   No Known Allergies      Medication List        Accurate as of February 15, 2021  5:39 PM. If you have any questions, ask your nurse or doctor.          ALPRAZolam 0.5 MG tablet Commonly known as: XANAX TAKE 1 OR 2 TABLETS BY MOUTH TWICE  DAILY IF NEEDED FOR ANXIETY   atorvastatin 40 MG tablet Commonly known as: LIPITOR Take 1 tablet (40 mg total) by mouth daily.   fluconazole 150 MG tablet Commonly known as: DIFLUCAN Take 1 tablet (150 mg total) by mouth once for 1 dose. At onset of symptoms. Repeat at end of treatment Started by: Claretta Fraise, MD   sulfamethoxazole-trimethoprim 800-160 MG tablet Commonly known as: BACTRIM DS Take 1 tablet by mouth 2 (two) times daily. Started by: Claretta Fraise, MD         Follow-up: Return in about 3 months (around 05/16/2021).  Claretta Fraise, M.D.

## 2021-02-16 LAB — CBC WITH DIFFERENTIAL/PLATELET
Basophils Absolute: 0.1 10*3/uL (ref 0.0–0.2)
Basos: 2 %
EOS (ABSOLUTE): 0.2 10*3/uL (ref 0.0–0.4)
Eos: 3 %
Hematocrit: 42.2 % (ref 34.0–46.6)
Hemoglobin: 14 g/dL (ref 11.1–15.9)
Immature Grans (Abs): 0 10*3/uL (ref 0.0–0.1)
Immature Granulocytes: 1 %
Lymphocytes Absolute: 1.8 10*3/uL (ref 0.7–3.1)
Lymphs: 28 %
MCH: 29.7 pg (ref 26.6–33.0)
MCHC: 33.2 g/dL (ref 31.5–35.7)
MCV: 89 fL (ref 79–97)
Monocytes Absolute: 0.5 10*3/uL (ref 0.1–0.9)
Monocytes: 8 %
Neutrophils Absolute: 4 10*3/uL (ref 1.4–7.0)
Neutrophils: 58 %
Platelets: 345 10*3/uL (ref 150–450)
RBC: 4.72 x10E6/uL (ref 3.77–5.28)
RDW: 13 % (ref 11.7–15.4)
WBC: 6.6 10*3/uL (ref 3.4–10.8)

## 2021-02-16 LAB — CMP14+EGFR
ALT: 16 IU/L (ref 0–32)
AST: 16 IU/L (ref 0–40)
Albumin/Globulin Ratio: 2 (ref 1.2–2.2)
Albumin: 4.3 g/dL (ref 3.8–4.8)
Alkaline Phosphatase: 88 IU/L (ref 44–121)
BUN/Creatinine Ratio: 11 (ref 9–23)
BUN: 8 mg/dL (ref 6–24)
Bilirubin Total: 0.4 mg/dL (ref 0.0–1.2)
CO2: 22 mmol/L (ref 20–29)
Calcium: 8.7 mg/dL (ref 8.7–10.2)
Chloride: 108 mmol/L — ABNORMAL HIGH (ref 96–106)
Creatinine, Ser: 0.74 mg/dL (ref 0.57–1.00)
Globulin, Total: 2.2 g/dL (ref 1.5–4.5)
Glucose: 86 mg/dL (ref 70–99)
Potassium: 4.8 mmol/L (ref 3.5–5.2)
Sodium: 142 mmol/L (ref 134–144)
Total Protein: 6.5 g/dL (ref 6.0–8.5)
eGFR: 100 mL/min/{1.73_m2} (ref >59)

## 2021-02-16 LAB — LIPID PANEL
Chol/HDL Ratio: 3.6 ratio (ref 0.0–4.4)
Cholesterol, Total: 167 mg/dL (ref 100–199)
HDL: 46 mg/dL (ref >39)
LDL Chol Calc (NIH): 105 mg/dL — ABNORMAL HIGH (ref 0–99)
Triglycerides: 87 mg/dL (ref 0–149)
VLDL Cholesterol Cal: 16 mg/dL (ref 5–40)

## 2021-02-16 NOTE — Progress Notes (Signed)
Hello Kathy Ewing,  Your lab result is normal and/or stable.Some minor variations that are not significant are commonly marked abnormal, but do not represent any medical problem for you.  Best regards, Kamora Vossler, M.D.

## 2021-02-17 LAB — URINE CULTURE

## 2021-02-22 ENCOUNTER — Other Ambulatory Visit: Payer: Self-pay | Admitting: *Deleted

## 2021-02-22 DIAGNOSIS — E782 Mixed hyperlipidemia: Secondary | ICD-10-CM

## 2021-02-22 MED ORDER — ATORVASTATIN CALCIUM 40 MG PO TABS: 40.0000 mg | ORAL_TABLET | Freq: Every day | ORAL | 3 refills | Status: DC

## 2021-02-25 ENCOUNTER — Ambulatory Visit: Payer: BC Managed Care – PPO | Admitting: Family

## 2021-02-25 ENCOUNTER — Encounter: Payer: Self-pay | Admitting: Family

## 2021-02-25 ENCOUNTER — Telehealth: Payer: Self-pay | Admitting: Family Medicine

## 2021-02-25 DIAGNOSIS — A084 Viral intestinal infection, unspecified: Secondary | ICD-10-CM

## 2021-02-25 MED ORDER — ONDANSETRON 4 MG PO TBDP: 4.0000 mg | ORAL_TABLET | Freq: Three times a day (TID) | ORAL | 0 refills | Status: DC | PRN

## 2021-02-25 NOTE — Patient Instructions (Signed)
Viral Gastroenteritis, Adult Viral gastroenteritis is also known as the stomach flu. This condition may affect your stomach, small intestine, and large intestine. It can cause sudden watery diarrhea, fever, and vomiting. This condition is caused by many different viruses. These viruses can be passed from person to person very easily (are contagious). Diarrhea and vomiting can make you feel weak and cause you to become dehydrated. You may not be able to keep fluids down. Dehydration can make you tired and thirsty, cause you to have a dry mouth, and decrease how often you urinate. It is important to replace the fluids that you lose from diarrhea and vomiting. What are the causes? Gastroenteritis is caused by many viruses, including rotavirus and norovirus. Norovirus is the most common cause in adults. You can get sick after being exposed to the viruses from other people. You can also get sick by: Eating food, drinking water, or touching a surface contaminated with one of these viruses. Sharing utensils or other personal items with an infected person. What increases the risk? You are more likely to develop this condition if you: Have a weak body defense system (immune system). Live with one or more children who are younger than 54 years old. Live in a nursing home. Travel on cruise ships. What are the signs or symptoms? Symptoms of this condition start suddenly 1-3 days after exposure to a virus. Symptoms may last for a few days or for as long as a week. Common symptoms include watery diarrhea and vomiting. Other symptoms include: Fever. Headache. Fatigue. Pain in the abdomen. Chills. Weakness. Nausea. Muscle aches. Loss of appetite. How is this diagnosed? This condition is diagnosed with a medical history and physical exam. You may also have a stool test to check for viruses or other infections. How is this treated? This condition typically goes away on its own. The focus of treatment is to  prevent dehydration and restore lost fluids (rehydration). This condition may be treated with: An oral rehydration solution (ORS) to replace important salts and minerals (electrolytes) in your body. Take this if told by your health care provider. This is a drink that is sold at pharmacies and retail stores. Medicines to help with your symptoms. Probiotic supplements to reduce symptoms of diarrhea. Fluids given through an IV, if dehydration is severe. Older adults and people with other diseases or a weak immune system are at higher risk for dehydration. Follow these instructions at home: Eating and drinking  Take an ORS as told by your health care provider. Drink clear fluids in small amounts as you are able. Clear fluids include: Water. Ice chips. Diluted fruit juice. Low-calorie sports drinks. Drink enough fluid to keep your urine pale yellow. Eat small amounts of healthy foods every 3-4 hours as you are able. This may include whole grains, fruits, vegetables, lean meats, and yogurt. Avoid fluids that contain a lot of sugar or caffeine, such as energy drinks, sports drinks, and soda. Avoid spicy or fatty foods. Avoid alcohol. General instructions Wash your hands often, especially after having diarrhea or vomiting. If soap and water are not available, use hand sanitizer. Make sure that all people in your household wash their hands well and often. Take over-the-counter and prescription medicines only as told by your health care provider. Rest at home while you recover. Watch your condition for any changes. Take a warm bath to relieve any burning or pain from frequent diarrhea episodes. Keep all follow-up visits as told by your health care provider. This  is important. Contact a health care provider if you: Cannot keep fluids down. Have symptoms that get worse. Have new symptoms. Feel light-headed or dizzy. Have muscle cramps. Get help right away if you: Have chest pain. Feel  extremely weak or you faint. See blood in your vomit. Have vomit that looks like coffee grounds. Have bloody or black stools or stools that look like tar. Have a severe headache, a stiff neck, or both. Have a rash. Have severe pain, cramping, or bloating in your abdomen. Have trouble breathing or you are breathing very quickly. Have a fast heartbeat. Have skin that feels cold and clammy. Feel confused. Have pain when you urinate. Have signs of dehydration, such as: Dark urine, very little urine, or no urine. Cracked lips. Dry mouth. Sunken eyes. Sleepiness. Weakness. Summary Viral gastroenteritis is also known as the stomach flu. It can cause sudden watery diarrhea, fever, and vomiting. This condition can be passed from person to person very easily (is contagious). Take an ORS if told by your health care provider. This is a drink that is sold at pharmacies and retail stores. Wash your hands often, especially after having diarrhea or vomiting. If soap and water are not available, use hand sanitizer. This information is not intended to replace advice given to you by your health care provider. Make sure you discuss any questions you have with your health care provider. Document Revised: 06/22/2018 Document Reviewed: 11/08/2017 Elsevier Patient Education  2022 [employer].

## 2021-02-25 NOTE — Telephone Encounter (Signed)
Patient said the insurance does not cover the medicine that was called in for her nausea but she has a lower dose at home already and wants to know if she can take those instead. Please call back and advise.

## 2021-02-25 NOTE — Telephone Encounter (Signed)
Pt ha 8mg  of Zofran at home and has been taking and they are helpful. States that her insurance no longer covers Zofran.  Do you want to send in another medication for N&V or she said she is fine to keep taking what she has at home.  Believes she has a GI bug

## 2021-02-25 NOTE — Telephone Encounter (Signed)
Pt is returning nurse call.

## 2021-02-25 NOTE — Telephone Encounter (Signed)
She is ok to continue zofran 8 mg at home. If this is not working she can call and I will change.   [name], FNP

## 2021-02-25 NOTE — Telephone Encounter (Signed)
Left message to return call 

## 2021-02-25 NOTE — Progress Notes (Signed)
Virtual Visit Consent   Kathy Ewing, you are scheduled for a virtual visit with a [facility] provider today.     Just as with appointments in the office, your consent must be obtained to participate.  Your consent will be active for this visit and any virtual visit you may have with one of our providers in the next 365 days.     If you have a MyChart account, a copy of this consent can be sent to you electronically.  All virtual visits are billed to your insurance company just like a traditional visit in the office.    As this is a virtual visit, video technology does not allow for your provider to perform a traditional examination.  This may limit your provider's ability to fully assess your condition.  If your provider identifies any concerns that need to be evaluated in person or the need to arrange testing (such as labs, EKG, etc.), we will make arrangements to do so.     Although advances in technology are sophisticated, we cannot ensure that it will always work on either your end or our end.  If the connection with a video visit is poor, the visit may have to be switched to a telephone visit.  With either a video or telephone visit, we are not always able to ensure that we have a secure connection.     I need to obtain your verbal consent now.   Are you willing to proceed with your visit today?    Kathy Ewing has provided verbal consent on 02/25/2021 for a virtual visit (video or telephone).   [name], FNP   Date: 02/25/2021 11:08 AM   Virtual Visit via Video Note   I, [name], connected with  Kathy Ewing  ([id number], 05/26/1970) on 02/25/21 at  5:15 PM EST by a video-enabled telemedicine application and verified that I am speaking with the correct person using two identifiers.  Location: Patient: Virtual Visit Location Patient: Home Provider: Virtual Visit Location Provider: Office/Clinic   I discussed the limitations of evaluation and management by  telemedicine and the availability of in person appointments. The patient expressed understanding and agreed to proceed.    History of Present Illness: Kathy Ewing is a 51 y.o. who identifies as a female who was assigned female at birth, and is being seen today for nausea and vomiting. She took a home COVID test that was negative.   HPI: Emesis  This is a new problem. Episode onset: two days. The problem occurs more than 10 times per day. The problem has been waxing and waning. The emesis has an appearance of stomach contents. The maximum temperature recorded prior to her arrival was 100.4 - 100.9 F. Associated symptoms include chills, coughing, diarrhea, a fever, headaches and myalgias. Pertinent negatives include no dizziness. She has tried acetaminophen and bed rest for the symptoms. The treatment provided mild relief.   Problems:  Patient Active Problem List   Diagnosis Date Noted   Chronic radicular lumbar pain 02/15/2021   Migraine 07/28/2016   Chronic migraine without aura without status migrainosus, not intractable 11/23/2015   BMI 33.0-33.9,adult 08/24/2015   Heel pain, bilateral 08/24/2015   Mixed hyperlipidemia 03/31/2015   Vitamin D deficiency 02/19/2014   GERD (gastroesophageal reflux disease) 02/19/2014   Vitamin B 12 deficiency 02/19/2014   Tobacco abuse 12/06/2013   Anxiety 12/06/2013    Allergies: No Known Allergies Medications:  Current Outpatient Medications:    ondansetron (  ZOFRAN-ODT) 4 MG disintegrating tablet, Take 1 tablet (4 mg total) by mouth every 8 (eight) hours as needed for nausea or vomiting., Disp: 20 tablet, Rfl: 0   ALPRAZolam (XANAX) 0.5 MG tablet, TAKE 1 OR 2 TABLETS BY MOUTH TWICE DAILY IF NEEDED FOR ANXIETY, Disp: 60 tablet, Rfl: 5   atorvastatin (LIPITOR) 40 MG tablet, Take 1 tablet (40 mg total) by mouth daily., Disp: 90 tablet, Rfl: 3  Current Facility-Administered Medications:    cyanocobalamin ((VITAMIN B-12)) injection 1,000 mcg, 1,000 mcg,  Intramuscular, Q30 days, Claretta Fraise, MD, 1,000 mcg at 05/25/20 1126  Observations/Objective: Patient is well-developed, well-nourished in no acute distress.  Resting comfortably  laying in bed  Head is normocephalic, atraumatic.  No labored breathing.  Speech is clear and coherent with logical content.  Patient is alert and oriented at baseline.    Assessment and Plan: 1. Viral gastroenteritis - ondansetron (ZOFRAN-ODT) 4 MG disintegrating tablet; Take 1 tablet (4 mg total) by mouth every 8 (eight) hours as needed for nausea or vomiting.  Dispense: 20 tablet; Refill: 0  Force fluids Bland diet Zofran as needed Rest Work note given   Follow Up Instructions: I discussed the assessment and treatment plan with the patient. The patient was provided an opportunity to ask questions and all were answered. The patient agreed with the plan and demonstrated an understanding of the instructions.  A copy of instructions were sent to the patient via MyChart unless otherwise noted below.     The patient was advised to call back or seek an in-person evaluation if the symptoms worsen or if the condition fails to improve as anticipated.  Time:  I spent 9 minutes with the patient via telehealth technology discussing the above problems/concerns.    [name], FNP

## 2021-02-25 NOTE — Telephone Encounter (Signed)
Patient aware and verbalized understanding. °

## 2021-03-03 ENCOUNTER — Ambulatory Visit (HOSPITAL_COMMUNITY)
Admission: RE | Admit: 2021-03-03 | Discharge: 2021-03-03 | Disposition: A | Discharge status: Home / Self Care | Payer: BC Managed Care – PPO | Source: Ambulatory Visit | Attending: Family Medicine | Admitting: Family Medicine

## 2021-03-03 ENCOUNTER — Other Ambulatory Visit: Payer: Self-pay

## 2021-03-03 DIAGNOSIS — M5416 Radiculopathy, lumbar region: Secondary | ICD-10-CM | POA: Diagnosis not present

## 2021-03-03 DIAGNOSIS — G8929 Other chronic pain: Secondary | ICD-10-CM | POA: Insufficient documentation

## 2021-03-03 DIAGNOSIS — M5136 Other intervertebral disc degeneration, lumbar region: Secondary | ICD-10-CM | POA: Diagnosis not present

## 2021-03-18 ENCOUNTER — Encounter: Payer: Self-pay | Admitting: Family Medicine

## 2021-03-18 ENCOUNTER — Ambulatory Visit: Payer: BC Managed Care – PPO | Admitting: Family Medicine

## 2021-03-18 DIAGNOSIS — J069 Acute upper respiratory infection, unspecified: Secondary | ICD-10-CM | POA: Diagnosis not present

## 2021-03-18 DIAGNOSIS — J0140 Acute pansinusitis, unspecified: Secondary | ICD-10-CM

## 2021-03-18 MED ORDER — FLUTICASONE PROPIONATE 50 MCG/ACT NA SUSP: 2.0000 | Freq: Every day | NASAL | 6 refills | Status: DC

## 2021-03-18 MED ORDER — GUAIFENESIN ER 600 MG PO TB12: 600.0000 mg | ORAL_TABLET | Freq: Two times a day (BID) | ORAL | 0 refills | Status: AC

## 2021-03-18 MED ORDER — AMOXICILLIN-POT CLAVULANATE 875-125 MG PO TABS: 1.0000 | ORAL_TABLET | Freq: Two times a day (BID) | ORAL | 0 refills | Status: AC

## 2021-03-18 NOTE — Progress Notes (Signed)
? ?Virtual Visit via telephone Note ?Due to COVID-19 pandemic this visit was conducted virtually. This visit type was conducted due to national recommendations for restrictions regarding the COVID-19 Pandemic (e.g. social distancing, sheltering in place) in an effort to limit this patient's exposure and mitigate transmission in our community. All issues noted in this document were discussed and addressed.  A physical exam was not performed with this format.  ? ?I connected with Kathy Ewing on 03/18/2021 at 0950 by telephone and verified that I am speaking with the correct person using two identifiers. Kathy Ewing is currently located at home and family is currently with them during visit. The provider, Monia Pouch, FNP is located in their office at time of visit. ? ?I discussed the limitations, risks, security and privacy concerns of performing an evaluation and management service by virtual visit and the availability of in person appointments. I also discussed with the patient that there may be a patient responsible charge related to this service. The patient expressed understanding and agreed to proceed. ? ?Subjective:  ?Patient ID: Kathy Ewing, female    DOB: 1970/01/23, 51 y.o.   MRN: [id number] ? ?Chief Complaint:  Sinusitis ? ? ?HPI: ?Kathy Ewing is a 51 y.o. female presenting on 03/18/2021 for Sinusitis ? ? ?Pt reports ongoing head and sinus congestion for over 7 days. Has been using DayQuil and NyQuil without relief of symptoms. Home COVID testing negative. Does have frontal and maxillary sinus pressure.  ? ?Sinusitis ?This is a recurrent problem. The current episode started 1 to 4 weeks ago. The problem has been gradually worsening since onset. Her pain is at a severity of 5/10. The pain is moderate. Associated symptoms include chills, congestion, coughing, ear pain, headaches, a hoarse voice, sinus pressure, a sore throat and swollen glands. Pertinent negatives include no diaphoresis, neck  pain, shortness of breath or sneezing. Past treatments include acetaminophen and oral decongestants. The treatment provided no relief.  ? ? ?Relevant past medical, surgical, family, and social history reviewed and updated as indicated.  ?Allergies and medications reviewed and updated. ? ? ?Past Medical History:  ?Diagnosis Date  ? Anxiety   ? B12 deficiency   ? Cholelithiasis   ? GERD (gastroesophageal reflux disease)   ? Headache   ? migraine   ? High cholesterol   ? Numbness   ? Pneumonia   ? Vitamin D deficiency   ? ? ?Past Surgical History:  ?Procedure Laterality Date  ? CESAREAN SECTION    ? x 2  ? CHOLECYSTECTOMY N/A 10/02/2014  ? Procedure: LAPAROSCOPIC CHOLECYSTECTOMY;  Surgeon: Rolm Bookbinder, MD;  Location: [facility];  Service: General;  Laterality: N/A;  ? ? ?Social History  ? ?Socioeconomic History  ? Marital status: Married  ?  Spouse name: Not on file  ? Number of children: Not on file  ? Years of education: Not on file  ? Highest education level: Not on file  ?Occupational History  ? Not on file  ?Tobacco Use  ? Smoking status: Every Day  ?  Packs/day: 0.50  ?  Years: 30.00  ?  Pack years: 15.00  ?  Types: Cigarettes  ? Smokeless tobacco: Never  ?Vaping Use  ? Vaping Use: Never used  ?Substance and Sexual Activity  ? Alcohol use: No  ?  Alcohol/week: 0.0 standard drinks  ? Drug use: No  ? Sexual activity: Yes  ?  Birth control/protection: Surgical  ?Other Topics Concern  ? Not on file  ?  Social History Narrative  ? She works at [employer] as a Education officer, community.  ? Highest level education:  9th grade  ? She lives with husband, daughter, and son.   ? ?Social Determinants of Health  ? ?Financial Resource Strain: Not on file  ?Food Insecurity: Not on file  ?Transportation Needs: Not on file  ?Physical Activity: Not on file  ?Stress: Not on file  ?Social Connections: Not on file  ?Intimate Partner Violence: Not on file  ? ? ?Outpatient Encounter Medications as of 03/18/2021  ?Medication Sig  ? amoxicillin-clavulanate  (AUGMENTIN) 875-125 MG tablet Take 1 tablet by mouth 2 (two) times daily for 10 days.  ? fluticasone (FLONASE) 50 MCG/ACT nasal spray Place 2 sprays into both nostrils daily.  ? guaiFENesin (MUCINEX) 600 MG 12 hr tablet Take 1 tablet (600 mg total) by mouth 2 (two) times daily for 10 days.  ? ALPRAZolam (XANAX) 0.5 MG tablet TAKE 1 OR 2 TABLETS BY MOUTH TWICE DAILY IF NEEDED FOR ANXIETY  ? atorvastatin (LIPITOR) 40 MG tablet Take 1 tablet (40 mg total) by mouth daily.  ? ondansetron (ZOFRAN-ODT) 4 MG disintegrating tablet Take 1 tablet (4 mg total) by mouth every 8 (eight) hours as needed for nausea or vomiting.  ? ?Facility-Administered Encounter Medications as of 03/18/2021  ?Medication  ? cyanocobalamin ((VITAMIN B-12)) injection 1,000 mcg  ? ? ?No Known Allergies ? ?Review of Systems  ?Constitutional:  Positive for activity change, chills and fatigue. Negative for appetite change, diaphoresis, fever and unexpected weight change.  ?HENT:  Positive for congestion, ear pain, hoarse voice, postnasal drip, rhinorrhea, sinus pressure, sinus pain, sore throat and voice change. Negative for dental problem, drooling, ear discharge, facial swelling, hearing loss, mouth sores, nosebleeds, sneezing, tinnitus and trouble swallowing.   ?Eyes:  Negative for photophobia and visual disturbance.  ?Respiratory:  Positive for cough. Negative for apnea, choking, chest tightness, shortness of breath, wheezing and stridor.   ?Cardiovascular:  Negative for chest pain, palpitations and leg swelling.  ?Gastrointestinal:  Negative for abdominal pain.  ?Genitourinary:  Negative for decreased urine volume and difficulty urinating.  ?Musculoskeletal:  Negative for neck pain.  ?Neurological:  Positive for headaches. Negative for dizziness, tremors, seizures, syncope, facial asymmetry, speech difficulty, weakness, light-headedness and numbness.  ?Psychiatric/Behavioral:  Negative for confusion.   ?All other systems reviewed and are negative. ? ?    ? ? ?Observations/Objective: ?No vital signs or physical exam, this was a virtual health encounter.  ?Pt alert and oriented, answers all questions appropriately, and able to speak in full sentences.  ? ? ?Assessment and Plan: ?[place] was seen today for sinusitis. ? ?Diagnoses and all orders for this visit: ? ?URI with cough and congestion ?Acute non-recurrent pansinusitis ?Symptoms consistent with sinusitis. Had negative home COVID testing. Has failed OTC / symptomatic care at home. Will initiate antibiotic therapy and add Flonase and Mucinex to regimen. Pt aware to take full course of antibiotic therapy. Report any new, worsening, or persistent symptoms. Follow up as needed.  ?-     fluticasone (FLONASE) 50 MCG/ACT nasal spray; Place 2 sprays into both nostrils daily. ?-     guaiFENesin (MUCINEX) 600 MG 12 hr tablet; Take 1 tablet (600 mg total) by mouth 2 (two) times daily for 10 days. ?-     amoxicillin-clavulanate (AUGMENTIN) 875-125 MG tablet; Take 1 tablet by mouth 2 (two) times daily for 10 days. ? ? ? ? ?Follow Up Instructions: ?Return if symptoms worsen or fail to improve. ? ?  ?  I discussed the assessment and treatment plan with the patient. The patient was provided an opportunity to ask questions and all were answered. The patient agreed with the plan and demonstrated an understanding of the instructions. ?  ?The patient was advised to call back or seek an in-person evaluation if the symptoms worsen or if the condition fails to improve as anticipated. ? ?The above assessment and management plan was discussed with the patient. The patient verbalized understanding of and has agreed to the management plan. Patient is aware to call the clinic if they develop any new symptoms or if symptoms persist or worsen. Patient is aware when to return to the clinic for a follow-up visit. Patient educated on when it is appropriate to go to the emergency department.  ? ? ?I provided 15 minutes of time during this  telephone encounter. ? ? ?Monia Pouch, FNP-C ?[facility] ?[address] ?[place], [place] [address] ?[phone] ?03/18/2021 ? ? ?

## 2021-04-01 ENCOUNTER — Encounter: Payer: Self-pay | Admitting: Family

## 2021-04-01 ENCOUNTER — Ambulatory Visit: Payer: BC Managed Care – PPO | Admitting: Family

## 2021-04-01 ENCOUNTER — Telehealth: Payer: Self-pay | Admitting: Family Medicine

## 2021-04-01 VITALS — BP 121/85 | HR 77 | Temp 98.6°F | Ht 66.0 in | Wt 177.0 lb

## 2021-04-01 DIAGNOSIS — M545 Low back pain, unspecified: Secondary | ICD-10-CM | POA: Diagnosis not present

## 2021-04-01 DIAGNOSIS — R079 Chest pain, unspecified: Secondary | ICD-10-CM

## 2021-04-01 DIAGNOSIS — F419 Anxiety disorder, unspecified: Secondary | ICD-10-CM

## 2021-04-01 MED ORDER — DICLOFENAC SODIUM 75 MG PO TBEC: 75.0000 mg | DELAYED_RELEASE_TABLET | Freq: Two times a day (BID) | ORAL | 0 refills | Status: DC

## 2021-04-01 MED ORDER — BACLOFEN 10 MG PO TABS: 10.0000 mg | ORAL_TABLET | Freq: Three times a day (TID) | ORAL | 0 refills | Status: DC

## 2021-04-01 NOTE — Progress Notes (Signed)
? ?Subjective:  ? ? Patient ID: Kathy Ewing, female    DOB: 05-06-1970, 51 y.o.   MRN: [id number] ? ?Chief Complaint  ?Patient presents with  ? Back Pain  ? Chest Pain  ?  For weeks   ? ?Pt presents to the office today with back pian. 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 ?She reports the pain is worse. 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  ? ?She is also complaining of intermittent chest tightness that last 10-15 mins.  ?Back Pain ?This is a new problem. The current episode started more than 1 month ago. The problem occurs constantly. The problem has been gradually worsening since onset. The pain is present in the lumbar spine. The quality of the pain is described as aching. The pain does not radiate. The pain is at a severity of 8/10. The pain is moderate. The symptoms are aggravated by twisting and standing. Associated symptoms include chest pain, leg pain and numbness. Pertinent negatives include no fever or headaches. Risk factors include obesity. She has tried NSAIDs for the symptoms. The treatment provided mild relief.  ?Chest Pain  ?This is a new problem. The current episode started 1 to 4 weeks ago. The onset quality is gradual. The problem occurs intermittently. The pain is present in the substernal region and lateral region. The pain is at a severity of 10/10. The pain is moderate. The quality of the pain is described as pressure (throbbing). The pain radiates to the right arm. Associated symptoms include back pain, dizziness, leg pain, numbness and shortness of breath. Pertinent negatives include no fever or headaches. The pain is aggravated by nothing. She has tried rest for the symptoms. The treatment provided mild relief.  ? ? ? ?Review of Systems  ?Constitutional:  Negative for fever.  ?Respiratory:  Positive for shortness of breath.   ?Cardiovascular:   Positive for chest pain.  ?Musculoskeletal:  Positive for back pain.  ?Neurological:  Positive for dizziness and numbness. Negative for headaches.  ?All other systems reviewed and are negative. ? ?   ?Objective:  ? Physical Exam ?Vitals reviewed.  ?Constitutional:   ?   General: She is not in acute distress. ?   Appearance: She is well-developed.  ?HENT:  ?   Head: Normocephalic and atraumatic.  ?   Right Ear: External ear normal.  ?Eyes:  ?   Pupils: Pupils are equal, round, and reactive to light.  ?Neck:  ?   Thyroid: No thyromegaly.  ?Cardiovascular:  ?   Rate and Rhythm: Normal rate and regular rhythm.  ?   Heart sounds: Normal heart sounds. No murmur heard. ?Pulmonary:  ?   Effort: Pulmonary effort is normal. No respiratory distress.  ?   Breath sounds: Normal breath sounds. No wheezing.  ?Abdominal:  ?   General: Bowel sounds are normal. There is no distension.  ?   Palpations: Abdomen is soft.  ?   Tenderness: There is no abdominal tenderness.  ?Musculoskeletal:     ?   General: No tenderness.  ?   Cervical back: Normal range of motion and neck supple.  ?   Comments: Pain in bilateral lumbar with flexion and extension.   ?Skin: ?   General: Skin is warm and dry.  ?Neurological:  ?   Mental Status: She is alert and oriented to person,  place, and time.  ?   Cranial Nerves: No cranial nerve deficit.  ?   Deep Tendon Reflexes: Reflexes are normal and symmetric.  ?Psychiatric:     ?   Mood and Affect: Mood is anxious.     ?   Behavior: Behavior normal.     ?   Thought Content: Thought content normal.     ?   Judgment: Judgment normal.  ? ? ?BP 121/85   Pulse 77   Temp 98.6 ?F (37 ?C) (Temporal)   Ht '5\' 6"'$  (1.676 m)   Wt 177 lb (80.3 kg)   SpO2 99%   BMI 28.57 kg/m?  ? ?   ?Assessment & Plan:  ?Kathy Ewing comes in today with chief complaint of Back Pain and Chest Pain (For weeks ) ? ? ?Diagnosis and orders addressed: ? ?1. Chest pain, unspecified type ?Referral to Cardiologists pending  ?If chest pain  continues or SOB go to ED ?- EKG 12-Lead ?- Ambulatory referral to Cardiology ? ?2. Acute bilateral low back pain, unspecified whether sciatica present ?ROM exercises discussed  ?Baclofen TID prn  ?Diclofenac BID with food ?No other NSAID's ?Referral to PT pending  ?- baclofen (LIORESAL) 10 MG tablet; Take 1 tablet (10 mg total) by mouth 3 (three) times daily.  Dispense: 30 each; Refill: 0 ?- diclofenac (VOLTAREN) 75 MG EC tablet; Take 1 tablet (75 mg total) by mouth 2 (two) times daily.  Dispense: 30 tablet; Refill: 0 ?- Ambulatory referral to Physical Therapy ? ?3. Anxiety ?Continue medications  ? ? ?Follow up plan: ?Keep follow up with PCP, referral to Cardiologists and PT ? ? ?[name], FNP ? ? ? ?

## 2021-04-01 NOTE — Patient Instructions (Signed)

## 2021-04-01 NOTE — Telephone Encounter (Signed)
diclofenac (VOLTAREN) 75 MG EC tablet ?baclofen (LIORESAL) 10 MG tablet ?Need to be sent to [facility] in [place] ?

## 2021-04-01 NOTE — Telephone Encounter (Signed)
Patient aware and verbalized understanding. °

## 2021-04-06 ENCOUNTER — Ambulatory Visit: Payer: BC Managed Care – PPO | Attending: Family | Admitting: Physical Therapy

## 2021-04-06 ENCOUNTER — Other Ambulatory Visit: Payer: Self-pay

## 2021-04-06 DIAGNOSIS — M545 Low back pain, unspecified: Secondary | ICD-10-CM | POA: Insufficient documentation

## 2021-04-06 NOTE — Therapy (Signed)
[place] [facility] ?[address] ?[place], [place], [address] ?Phone: [phone]   Fax:  [phone] ? ?Physical Therapy Evaluation ? ?Patient Details  ?Name: Kathy Ewing ?MRN: [id number] ?Date of Birth: 01-04-1971 ?Referring Provider (PT): Jannifer Rodney ? ? ?Encounter Date: 04/06/2021 ? ? PT End of Session - 04/06/21 1020   ? ? Visit Number 1   ? Number of Visits 12   ? Date for PT Re-Evaluation 05/18/21   ? PT Start Time [phone]   ? PT Stop Time 1032   ? PT Time Calculation (min) 39 min   ? Activity Tolerance Patient tolerated treatment well   ? Behavior During Therapy [facility] for tasks assessed/performed   ? ?  ?  ? ?  ? ? ?Past Medical History:  ?Diagnosis Date  ? Anxiety   ? B12 deficiency   ? Cholelithiasis   ? GERD (gastroesophageal reflux disease)   ? Headache   ? migraine   ? High cholesterol   ? Numbness   ? Pneumonia   ? Vitamin D deficiency   ? ? ?Past Surgical History:  ?Procedure Laterality Date  ? CESAREAN SECTION    ? x 2  ? CHOLECYSTECTOMY N/A 10/02/2014  ? Procedure: LAPAROSCOPIC CHOLECYSTECTOMY;  Surgeon: Emelia Loron, MD;  Location: [facility] OR;  Service: General;  Laterality: N/A;  ? ? ?There were no vitals filed for this visit. ? ? ? Subjective Assessment - 04/06/21 1037   ? ? Subjective COVID-19 screen performed prior to patient entering clinic.  The patient presents to the clinic today with c/o low back that has been ongoing for about 6 to 8 months.  her pain today is rated at a 6/10 but can become quite severe.  She also reports she feels numbness in her anterior thighs at times.  He has also been experiencing some neck pain.  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   ? Pertinent History Unremarkable.   ? How long can you sit comfortably? Varies.   ? How long can you stand comfortably? The longer she stands the worse her pain gets.   ? How long can you walk comfortably? The longer she walks the  worse her pain gets.   ? Diagnostic tests MRI:  Mild lower lumbar degenerative disc disease without spinal canal or  neural foraminal stenosis.   ? Patient Stated Goals Get out of pain.   ? Currently in Pain? Yes   ? Pain Score 6    ? Pain Location Back   ? Pain Orientation Right;Left;Lower   ? Pain Descriptors / Indicators Aching;Numbness   ? Pain Type Chronic pain   ? Pain Radiating Towards Anterior thighs.   ? Pain Onset More than a month ago   ? Pain Frequency Constant   ? Aggravating Factors  See above.   ? Pain Relieving Factors See above.   ? ?  ?  ? ?  ? ? ? ? ? [facility] PT Assessment - 04/06/21 0001   ? ?  ? Assessment  ? Medical Diagnosis Acute bilateral low back pain.   ? Referring Provider (PT) Jannifer Rodney   ? Onset Date/Surgical Date --   6-8 months.  ?  ? Precautions  ? Precautions None   ?  ? Restrictions  ? Weight Bearing Restrictions No   ?  ? Balance Screen  ? Has the patient fallen in the  past 6 months No   ? Has the patient had a decrease in activity level because of a fear of falling?  No   ? Is the patient reluctant to leave their home because of a fear of falling?  No   ?  ? Home Environment  ? Living Environment Private residence   ?  ? Prior Function  ? Level of Independence Independent   ?  ? Observation/Other Assessments  ? Focus on Therapeutic Outcomes (FOTO)  Complete.   ?  ? Posture/Postural Control  ? Posture/Postural Control Postural limitations   ? Postural Limitations Rounded Shoulders;Forward head   ?  ? Deep Tendon Reflexes  ? DTR Assessment Site Patella;Achilles   ? Patella DTR 2+   ? Achilles DTR 2+   ?  ? ROM / Strength  ? AROM / PROM / Strength AROM;Strength   ?  ? AROM  ? Overall AROM Comments Full lumbar flexion but performed very slowly.  Active extension is 10 degrees and painful at endrange.   ?  ? Strength  ? Overall Strength Comments Normal LE strength.   ?  ? Palpation  ? Palpation comment C/o diffuse bilateral low back pain in lower lumbar region.   ?  ? Special Tests   ? Other special tests Equal leg lengths. Pain reproduction with bilateral SLR testing.   ?  ? Transfers  ? Comments Slow transition from sit to stand with use of armrest in obvious pain.   ?  ? Ambulation/Gait  ? Gait Comments Slow and purposeful gait in some spinal flexion in obvious pain.   ? ?  ?  ? ?  ? ? ? ? ? ? ? ? ? ? ? ? ? ?Objective measurements completed on examination: See above findings.  ? ? ? ? ? [facility] Adult PT Treatment/Exercise - 04/06/21 0001   ? ?  ? Modalities  ? Modalities Electrical Stimulation   ?  ? Electrical Stimulation  ? Electrical Stimulation Location Bilateral lower lumbar   ? Electrical Stimulation Action IFC at 80-150 Hz.   ? Electrical Stimulation Parameters 40% scan x 15 minutes   ? Electrical Stimulation Goals Tone;Pain   ? ?  ?  ? ?  ? ? ? ? ? ? ? ? ? ? ? ? ? ? ? PT Long Term Goals - 04/06/21 1105   ? ?  ? PT LONG TERM GOAL #1  ? Title Independent with a HEP.   ? Time 6   ? Period Weeks   ? Status New   ?  ? PT LONG TERM GOAL #2  ? Title Perform ADL's with pain not > 4/10.   ? Time 6   ? Period Weeks   ? Status New   ?  ? PT LONG TERM GOAL #3  ? Title Perform job duties with pain not > 4/10.   ? Time 6   ? Period Weeks   ? Status New   ? ?  ?  ? ?  ? ? ? ? ? ? ? ? ? Plan - 04/06/21 1100   ? ? Clinical Impression Statement The patient presenst to [facility] with c/o low back pain that has been ongoing and worseneing over the last 6-8 months.  She has c/o diffuse bilateral lower lumbar pain and occasional numbness into her anterior thighs.  She has normal LE strength and her DTR's are normal as well.  She did c/o of increased pain with straight leg  raise testing.  Her movements are very slow and her gait is purposeful and she is in obvious pain.  Patient will benefit from skilled physical therapy intervention to address pain and deficits.   ? Personal Factors and Comorbidities Other   ? Examination-Activity Limitations Transfers;Locomotion Level;Sit;Stand;Other   ?  Examination-Participation Restrictions Other;Cleaning   ? Stability/Clinical Decision Making Evolving/Moderate complexity   ? Clinical Decision Making Low   ? Rehab Potential Good   ? PT Frequency 2x / week   ? PT Duration 6 weeks   ? PT Treatment/Interventions ADLs/Self Care Home Management;Cryotherapy;Electrical Stimulation;Ultrasound;Moist Heat;Functional mobility training;Therapeutic activities;Therapeutic exercise;Manual techniques;Patient/family education;Passive range of motion;Dry needling   ? PT Next Visit Plan Modalities and STW/M as needed.  Progression into core exercises.   ? Consulted and Agree with Plan of Care Patient   ? ?  ?  ? ?  ? ? ?Patient will benefit from skilled therapeutic intervention in order to improve the following deficits and impairments:  Abnormal gait, Decreased activity tolerance, Decreased mobility, Decreased range of motion, Pain ? ?Visit Diagnosis: ?Acute bilateral low back pain, unspecified whether sciatica present - Plan: PT plan of care cert/re-cert ? ? ? ? ?Problem List ?Patient Active Problem List  ? Diagnosis Date Noted  ? Chronic radicular lumbar pain 02/15/2021  ? Migraine 07/28/2016  ? Chronic migraine without aura without status migrainosus, not intractable 11/23/2015  ? BMI 33.0-33.9,adult 08/24/2015  ? Heel pain, bilateral 08/24/2015  ? Mixed hyperlipidemia 03/31/2015  ? Vitamin D deficiency 02/19/2014  ? GERD (gastroesophageal reflux disease) 02/19/2014  ? Vitamin B 12 deficiency 02/19/2014  ? Tobacco abuse 12/06/2013  ? Anxiety 12/06/2013  ? ? ?Jessalyn Hinojosa, [place], PT ?04/06/2021, 11:07 AM ? ?[place] [facility] ?[address] ?[place], [place], [address] ?Phone: [phone]   Fax:  [phone] ? ?Name: MARIHANNA BETSCHART ?MRN: [id number] ?Date of Birth: 04-27-1970 ? ? ?

## 2021-04-09 ENCOUNTER — Encounter: Payer: BC Managed Care – PPO | Admitting: Physical Therapy

## 2021-04-13 ENCOUNTER — Ambulatory Visit: Payer: BC Managed Care – PPO | Admitting: *Deleted

## 2021-04-13 ENCOUNTER — Other Ambulatory Visit: Payer: Self-pay

## 2021-04-13 DIAGNOSIS — M545 Low back pain, unspecified: Secondary | ICD-10-CM

## 2021-04-13 NOTE — Therapy (Addendum)
[facility] [address] [place], [place], [address] Phone: [phone]   Fax:  [phone]  Physical Therapy Treatment  Patient Details  Name: Kathy Ewing MRN: [id number] Date of Birth: [birth year] Referring Provider (PT): Jannifer Rodney   Encounter Date: 04/13/2021   PT End of Session - 04/13/21 1641     Visit Number 2    Number of Visits 12    Date for PT Re-Evaluation 05/18/21    PT Start Time 1515    PT Stop Time 1605    PT Time Calculation (min) 50 min             Past Medical History:  Diagnosis Date   Anxiety    B12 deficiency    Cholelithiasis    GERD (gastroesophageal reflux disease)    Headache    migraine    High cholesterol    Numbness    Pneumonia    Vitamin D deficiency     Past Surgical History:  Procedure Laterality Date   CESAREAN SECTION     x 2   CHOLECYSTECTOMY N/A 10/02/2014   Procedure: LAPAROSCOPIC CHOLECYSTECTOMY;  Surgeon: Emelia Loron, MD;  Location: [facility] OR;  Service: General;  Laterality: N/A;    There were no vitals filed for this visit.   Subjective Assessment - 04/13/21 1517     Subjective COVID-19 screen performed prior to patient entering clinic.  Doing about the same 6-7/10 LBP    Pertinent History Unremarkable.    How long can you sit comfortably? Varies.    How long can you stand comfortably? The longer she stands the worse her pain gets.    How long can you walk comfortably? The longer she walks the worse her pain gets.    Diagnostic tests MRI:  Mild lower lumbar degenerative disc disease without spinal canal or  neural foraminal stenosis.    Patient Stated Goals Get out of pain.    Currently in Pain? Yes    Pain Score 6     Pain Location Back    Pain Orientation Right;Left;Lower    Pain Descriptors / Indicators Aching    Pain Type Chronic pain    Pain Onset More than a month ago                               [facility] Adult PT  Treatment/Exercise - 04/13/21 0001       Self-Care   Self-Care ADL's    ADL's Discussed ADL's and postures to use with AB bracing and hinge bending to avoid pain triggers and help desensitize LB      Exercises   Exercises Lumbar      Lumbar Exercises: Supine   Ab Set 10 reps;5 seconds    Other Supine Lumbar Exercises Handout given for AB bracing. Also for Pt to start with draw-in and SOC for posture control      Modalities   Modalities Electrical Stimulation;Ultrasound      Programme researcher, broadcasting/film/video Location Bilateral lower lumbar    Electrical Stimulation Action IFC x  15 mins 80-150hz     Electrical Stimulation Parameters 80-150hz     Electrical Stimulation Goals Tone;Pain      Ultrasound   Ultrasound Location LB paras    Ultrasound Parameters RT sidelying 1.5 w/ cm2 x 12 mins    Ultrasound Goals Pain  PT Long Term Goals - 04/06/21 1105       PT LONG TERM GOAL #1   Title Independent with a HEP.    Time 6    Period Weeks    Status New      PT LONG TERM GOAL #2   Title Perform ADL's with pain not > 4/10.    Time 6    Period Weeks    Status New      PT LONG TERM GOAL #3   Title Perform job duties with pain not > 4/10.    Time 6    Period Weeks    Status New                   Plan - 04/13/21 1642     Clinical Impression Statement Pt arrived today doing about the same with LBP. Rx focused on body mechanics and postures to help decrease pain triggers for LB as well as AB bracing during transitional movements. [place] combo and Estim performed end of session.    Personal Factors and Comorbidities Other    Examination-Activity Limitations Transfers;Locomotion Level;Sit;Stand;Other    Examination-Participation Restrictions Other;Cleaning    Rehab Potential Good    PT Frequency 2x / week    PT Duration 6 weeks    PT Treatment/Interventions ADLs/Self Care Home Management;Cryotherapy;Electrical  Stimulation;Ultrasound;Moist Heat;Functional mobility training;Therapeutic activities;Therapeutic exercise;Manual techniques;Patient/family education;Passive range of motion;Dry needling    PT Next Visit Plan Modalities and STW/M as needed.  Progression into core exercises.    Consulted and Agree with Plan of Care Patient             Patient will benefit from skilled therapeutic intervention in order to improve the following deficits and impairments:  Abnormal gait, Decreased activity tolerance, Decreased mobility, Decreased range of motion, Pain  Visit Diagnosis: Acute bilateral low back pain, unspecified whether sciatica present     Problem List Patient Active Problem List   Diagnosis Date Noted   Chronic radicular lumbar pain 02/15/2021   Migraine 07/28/2016   Chronic migraine without aura without status migrainosus, not intractable 11/23/2015   BMI 33.0-33.9,adult 08/24/2015   Heel pain, bilateral 08/24/2015   Mixed hyperlipidemia 03/31/2015   Vitamin D deficiency 02/19/2014   GERD (gastroesophageal reflux disease) 02/19/2014   Vitamin B 12 deficiency 02/19/2014   Tobacco abuse 12/06/2013   Anxiety 12/06/2013    Jordanne Elsbury,CHRIS, PTA 04/13/2021, 4:50 PM  [facility] [address] [place], [place], [address] Phone: [phone]   Fax:  [phone]  Name: Kathy Ewing MRN: [id number] Date of Birth: [birth year]  PHYSICAL THERAPY DISCHARGE SUMMARY  Visits from Start of Care: 2.  Current functional level related to goals / functional outcomes: See above.   Remaining deficits: See below.   Education / Equipment:    Patient agrees to discharge. Patient goals were not met. Patient is being discharged due to not returning since the last visit.    [place] MPT

## 2021-04-20 ENCOUNTER — Ambulatory Visit: Payer: BC Managed Care – PPO | Admitting: *Deleted

## 2021-04-24 NOTE — Progress Notes (Signed)
CARDIOLOGY CONSULT NOTE  ? ? ? ? ? ?Patient ID: ?Kathy Ewing ?MRN: [id number] ?DOB/AGE: Jul 13, 1970 51 y.o. ? ?Admit date: (Not on file) ?Referring Physician: Hawks,christy ?Primary Physician: Kathy Fraise, MD ?Primary Cardiologist: New ?Reason for Consultation: Chest pain ? ?Active Problems: ?  * No active hospital problems. * ? ? ?HPI:  51 y.o. 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 ?Rx with Baclofen, Diclofenac and PT referral ? ?ECG normal short PR SR rate 64  ? ?Lab review shows LDL 105  ? 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 Resting pain associated with back pain and left neck/shoulder and arm pain  ? ?Her dad and husband at home Things ok there two older kids live locally  ? ?ROS ?All other systems reviewed and negative except as noted above ? ?Past Medical History:  ?Diagnosis Date  ? Anxiety   ? B12 deficiency   ? Cholelithiasis   ? GERD (gastroesophageal reflux disease)   ? Headache   ? migraine   ? High cholesterol   ? Numbness   ? Pneumonia   ? Vitamin D deficiency   ?  ?Family History  ?Problem Relation Age of Onset  ? Hypertension Mother   ? Migraines Sister   ? Multiple sclerosis Sister   ?     Deceased, 46  ? Anxiety disorder Brother   ?     x 2  ? Cerebral palsy Daughter   ?     Deceased, 11.5 years  ? Depression Daughter   ?  ?Social History  ? ?Socioeconomic History  ? Marital status: Married  ?  Spouse name: Not on file  ? Number of children: Not on file  ? Years of education: Not on file  ? Highest education level: Not on file   ?Occupational History  ? Not on file  ?Tobacco Use  ? Smoking status: Every Day  ?  Packs/day: 0.50  ?  Years: 30.00  ?  Pack years: 15.00  ?  Types: Cigarettes  ? Smokeless tobacco: Never  ?Vaping Use  ? Vaping Use: Some days  ?Substance and Sexual Activity  ? Alcohol use: No  ?  Alcohol/week: 0.0 standard drinks  ? Drug use: No  ? Sexual activity: Yes  ?  Birth control/protection: Surgical  ?Other Topics Concern  ? Not on file  ?Social History Narrative  ? She works at [employer] as a Education officer, community.  ? Highest level education:  9th grade  ? She lives with husband, daughter, and son.   ? ?Social Determinants of Health  ? ?Financial Resource Strain: Not on file  ?Food Insecurity: Not on file  ?Transportation Needs: Not on file  ?Physical Activity: Not on file  ?Stress: Not on file  ?Social Connections: Not on file  ?Intimate Partner Violence: Not on file  ?  ?Past Surgical History:  ?Procedure Laterality Date  ? CESAREAN SECTION    ? x 2  ? CHOLECYSTECTOMY N/A 10/02/2014  ? Procedure: LAPAROSCOPIC CHOLECYSTECTOMY;  Surgeon: Rolm Bookbinder, MD;  Location: [place];  Service: General;  Laterality: N/A;  ?  ? ? ?Current Outpatient Medications:  ?  ALPRAZolam (XANAX) 0.5 MG tablet, TAKE 1 OR 2 TABLETS BY MOUTH TWICE DAILY IF NEEDED FOR ANXIETY, Disp: 60 tablet, Rfl: 5 ?  atorvastatin (LIPITOR) 40 MG tablet, Take 1 tablet (40 mg total) by mouth daily., Disp: 90 tablet, Rfl: 3 ?  fluticasone (FLONASE) 50 MCG/ACT nasal spray, Place 2 sprays into both nostrils daily., Disp: 16 g, Rfl: 6 ?  ondansetron (ZOFRAN-ODT) 4 MG disintegrating tablet, Take 1 tablet (4 mg total) by mouth every 8 (eight) hours as needed for nausea or vomiting., Disp: 20 tablet, Rfl: 0 ?  baclofen (LIORESAL) 10 MG tablet, Take 1 tablet (10 mg total) by mouth 3 (three) times daily. (Patient not taking: Reported on 05/03/2021), Disp: 30 each, Rfl: 0 ?  diclofenac (VOLTAREN) 75 MG EC tablet, Take 1 tablet (75 mg total) by mouth 2 (two) times daily. (Patient  not taking: Reported on 05/03/2021), Disp: 30 tablet, Rfl: 0 ? ?Current Facility-Administered Medications:  ?  cyanocobalamin ((VITAMIN B-12)) injection 1,000 mcg, 1,000 mcg, Intramuscular, Q30 days, Kathy Fraise, MD, 1,000 mcg at 05/25/20 1126 ? cyanocobalamin  1,000 mcg Intramuscular Q30 days  ? ? ? ?Physical Exam: ?Blood pressure (!) 146/88, pulse 78, height 5' 6"  (1.676 m), weight 176 lb (79.8 kg), SpO2 99 %.   ? ?Affect appropriate ?Healthy:  appears stated age ?HEENT: normal ?Neck supple with no adenopathy ?JVP normal no bruits no thyromegaly ?Lungs clear with no wheezing and good diaphragmatic motion ?Heart:  S1/S2 no murmur, no rub, gallop or click ?PMI normal ?Abdomen: benighn, BS positve, no tenderness, no AAA ?no bruit.  No HSM or HJR ?Distal pulses intact with no bruits ?No edema ?Neuro non-focal ?Skin warm and dry ?No muscular weakness ? ? ?Labs: ?  ?Lab Results  ?Component Value Date  ? WBC 6.6 02/15/2021  ? HGB 14.0 02/15/2021  ? HCT 42.2 02/15/2021  ? MCV 89 02/15/2021  ? PLT 345 02/15/2021  ? No results for input(s): NA, K, CL, CO2, BUN, CREATININE, CALCIUM, PROT, BILITOT, ALKPHOS, ALT, AST, GLUCOSE in the last 168 hours. ? ?Invalid input(s): LABALBU ?No results found for: CKTOTAL, CKMB, CKMBINDEX, TROPONINI  ?Lab Results  ?Component Value Date  ? CHOL 167 02/15/2021  ? CHOL 184 02/20/2020  ? CHOL 180 01/24/2018  ? ?Lab Results  ?Component Value Date  ? HDL 46 02/15/2021  ? HDL 50 02/20/2020  ? HDL 47 01/24/2018  ? ?Lab Results  ?Component Value Date  ? LDLCALC 105 (H) 02/15/2021  ? LDLCALC 122 (H) 02/20/2020  ? LDLCALC 114 (H) 01/24/2018  ? ?Lab Results  ?Component Value Date  ? TRIG 87 02/15/2021  ? TRIG 61 02/20/2020  ? TRIG 93 01/24/2018  ? ?Lab Results  ?Component Value Date  ? CHOLHDL 3.6 02/15/2021  ? CHOLHDL 3.7 02/20/2020  ? CHOLHDL 3.8 01/24/2018  ? ?No results found for: LDLDIRECT  ?  ?Radiology: ?No results found. ? ?EKG: See HPI ? ? ?ASSESSMENT AND PLAN:  ? ?Chest Pain:  atypical  smoker normal ECG Cardiac CTA with calcium score  Will also order ESR/ANA and RF since pian sounds muscular  ?Back pain:  PT MRI with no foraminal narrowing Baclofen and Diclofenac ?Anxiety PRN xanax ?Smoking: counseled on cessation < 10 minutes  will see lung fields with CTA ? ? ?Cardiac CTA ?Lopressor 100 mg 2 hours before  ?Labs see above  ? ?  F/U cardiology PRN  ? ?Signed: ?Jenkins Rouge ?05/03/2021, 9:56 AM ? ? ?

## 2021-05-03 ENCOUNTER — Encounter: Payer: Self-pay | Admitting: Cardiovascular Disease

## 2021-05-03 ENCOUNTER — Other Ambulatory Visit (HOSPITAL_COMMUNITY)
Admission: RE | Admit: 2021-05-03 | Discharge: 2021-05-03 | Disposition: A | Discharge status: Home / Self Care | Payer: BC Managed Care – PPO | Source: Ambulatory Visit | Attending: Cardiovascular Disease | Admitting: Cardiovascular Disease

## 2021-05-03 ENCOUNTER — Ambulatory Visit: Payer: BC Managed Care – PPO | Admitting: Cardiovascular Disease

## 2021-05-03 ENCOUNTER — Encounter: Payer: Self-pay | Admitting: *Deleted

## 2021-05-03 VITALS — BP 146/88 | HR 78 | Ht 66.0 in | Wt 176.0 lb

## 2021-05-03 DIAGNOSIS — R079 Chest pain, unspecified: Secondary | ICD-10-CM

## 2021-05-03 DIAGNOSIS — F17200 Nicotine dependence, unspecified, uncomplicated: Secondary | ICD-10-CM | POA: Diagnosis not present

## 2021-05-03 DIAGNOSIS — R609 Edema, unspecified: Secondary | ICD-10-CM

## 2021-05-03 LAB — BASIC METABOLIC PANEL
Anion gap: 6 (ref 5–15)
BUN: 9 mg/dL (ref 6–20)
CO2: 25 mmol/L (ref 22–32)
Calcium: 8.4 mg/dL — ABNORMAL LOW (ref 8.9–10.3)
Chloride: 109 mmol/L (ref 98–111)
Creatinine, Ser: 0.82 mg/dL (ref 0.44–1.00)
GFR, Estimated: >60 mL/min (ref >60)
Glucose, Bld: 100 mg/dL — ABNORMAL HIGH (ref 70–99)
Potassium: 3.7 mmol/L (ref 3.5–5.1)
Sodium: 140 mmol/L (ref 135–145)

## 2021-05-03 LAB — SEDIMENTATION RATE: Sed Rate: 11 mm/hr (ref 0–22)

## 2021-05-03 MED ORDER — METOPROLOL TARTRATE 100 MG PO TABS: 100.0000 mg | ORAL_TABLET | ORAL | 0 refills | Status: DC

## 2021-05-03 NOTE — Patient Instructions (Signed)
Medication Instructions:  ?Your physician recommends that you continue on your current medications as directed. Please refer to the Current Medication list given to you today. ? ?*If you need a refill on your cardiac medications before your next appointment, please call your pharmacy* ? ? ?Lab Work: ?Your physician recommends that you return for lab work in: Today  ? ?If you have labs (blood work) drawn today and your tests are completely normal, you will receive your results only by: ?MyChart Message (if you have MyChart) OR ?A paper copy in the mail ?If you have any lab test that is abnormal or we need to change your treatment, we will call you to review the results. ? ? ?Testing/Procedures: ?Cardiac CT  ? ? ?Follow-Up: ?At [facility], you and your health needs are our priority.  As part of our continuing mission to provide you with exceptional heart care, we have created designated Provider Care Teams.  These Care Teams include your primary Cardiologist (physician) and Advanced Practice Providers (APPs -  Physician Assistants and Nurse Practitioners) who all work together to provide you with the care you need, when you need it. ? ?We recommend signing up for the patient portal called "MyChart".  Sign up information is provided on this After Visit Summary.  MyChart is used to connect with patients for Virtual Visits (Telemedicine).  Patients are able to view lab/test results, encounter notes, upcoming appointments, etc.  Non-urgent messages can be sent to your provider as well.   ?To learn more about what you can do with MyChart, go to [email or web address].   ? ?Your next appointment:   ? As Needed  ? ?The format for your next appointment:   ?In Person ? ?Provider:   ?Jenkins Rouge, MD  ? ? ?Other Instructions ?Thank you for choosing [facility]! ? ? ? ?Your cardiac CT will be scheduled at one of the below locations:  ? ?[facility] ?[address] ?[place], [place] [address] ?[phone]  [phone] ? ?OR ? ?[facility] ?[address] ?[address] ?[place], [place] [address] ?[phone] ? ?If scheduled at [facility], please arrive at the [facility] Entrance (Entrance C2) of [facility] 30 minutes prior to test start time. ?You can use the FREE valet parking offered at entrance C (encouraged to control the heart rate for the test)  ?Proceed to the [facility] Radiology Department (first floor) to check-in and test prep. ? ?All radiology patients and guests should use entrance C2 at [facility], accessed from [facility], even though the hospital's physical address listed is [address] ? ? ? ?If scheduled at [facility], please arrive 15 mins early for check-in and test prep. ? ?Please follow these instructions carefully (unless otherwise directed): ? ? ?On the Night Before the Test: ?Be sure to Drink plenty of water. ?Do not consume any caffeinated/decaffeinated beverages or chocolate 12 hours prior to your test. ?Do not take any antihistamines 12 hours prior to your test. ? ?On the Day of the Test: ?Drink plenty of water until 1 hour prior to the test. ?Do not eat any food 4 hours prior to the test. ?You may take your regular medications prior to the test.  ?Take metoprolol (Lopressor) two hours prior to test. ?HOLD Furosemide/Hydrochlorothiazide morning of the test. ?FEMALES- please wear underwire-free bra if available, avoid dresses & tight clothing ? ? ?*For Clinical Staff only. Please instruct patient the following:* ?  Heart Rate Medication Recommendations for Cardiac CT  ?Resting HR < 50 bpm  ?No medication  ?Resting HR 50-60 bpm and BP >110/50 mmHG   ?Consider Metoprolol tartrate 25 mg PO 90-120 min prior to scan  ?Resting HR 60-65 bpm and BP >110/50 mmHG  ?Metoprolol tartrate 50 mg PO 90-120 minutes prior to scan   ?Resting HR > 65 bpm and BP >110/50 mmHG  ?Metoprolol tartrate  100 mg PO 90-120 minutes prior to scan  ?Consider Ivabradine 10-15 mg PO or a calcium channel blocker for resting HR >60 bpm and contraindication to metoprolol tartrate  ?Consider Ivabradine 10-15 mg PO in combination with metoprolol tartrate for HR >80 bpm   ? ?     ?After the Test: ?Drink plenty of water. ?After receiving IV contrast, you may experience a mild flushed feeling. This is normal. ?On occasion, you may experience a mild rash up to 24 hours after the test. This is not dangerous. If this occurs, you can take Benadryl 25 mg and increase your fluid intake. ?If you experience trouble breathing, this can be serious. If it is severe call 911 IMMEDIATELY. If it is mild, please call our office. ?If you take any of these medications: Glipizide/Metformin, Avandament, Glucavance, please do not take 48 hours after completing test unless otherwise instructed. ? ?We will call to schedule your test 2-4 weeks out understanding that some insurance companies will need an authorization prior to the service being performed.  ? ?For non-scheduling related questions, please contact the cardiac imaging nurse navigator should you have any questions/concerns: ?Marchia Bond, Cardiac Imaging Nurse Navigator ?Gordy Clement, Cardiac Imaging Nurse Navigator ?[facility] ?Direct Office Dial: [phone]  ? ?For scheduling needs, including cancellations and rescheduling, please call [place], [phone]. ? ? ?Important Information About Sugar ? ? ? ? ?  ?

## 2021-05-04 LAB — RHEUMATOID FACTOR: Rheumatoid fact SerPl-aCnc: <10 IU/mL (ref <14.0)

## 2021-05-04 LAB — ANA: Anti Nuclear Antibody (ANA): NEGATIVE

## 2021-05-06 ENCOUNTER — Telehealth: Payer: Self-pay | Admitting: Cardiovascular Disease

## 2021-05-06 NOTE — Telephone Encounter (Signed)
?  Pt is returning call to get result ?

## 2021-05-07 NOTE — Telephone Encounter (Signed)
ANA negative ? ?Left message to return call ?

## 2021-05-07 NOTE — Telephone Encounter (Signed)
Patient is active on MyChart. I sent her message that ANA is negative. ?

## 2021-05-10 ENCOUNTER — Telehealth: Payer: Self-pay | Admitting: Emergency Medicine

## 2021-05-10 ENCOUNTER — Ambulatory Visit: Payer: BC Managed Care – PPO | Admitting: Family Medicine

## 2021-05-10 ENCOUNTER — Telehealth: Payer: Self-pay | Admitting: Cardiovascular Disease

## 2021-05-10 ENCOUNTER — Encounter: Payer: Self-pay | Admitting: Family Medicine

## 2021-05-10 VITALS — BP 127/78 | HR 62 | Temp 99.0°F | Ht 66.0 in | Wt 175.1 lb

## 2021-05-10 DIAGNOSIS — H538 Other visual disturbances: Secondary | ICD-10-CM

## 2021-05-10 DIAGNOSIS — G43811 Other migraine, intractable, with status migrainosus: Secondary | ICD-10-CM

## 2021-05-10 DIAGNOSIS — I10 Essential (primary) hypertension: Secondary | ICD-10-CM | POA: Diagnosis not present

## 2021-05-10 DIAGNOSIS — R42 Dizziness and giddiness: Secondary | ICD-10-CM | POA: Diagnosis not present

## 2021-05-10 DIAGNOSIS — R0789 Other chest pain: Secondary | ICD-10-CM | POA: Diagnosis not present

## 2021-05-10 NOTE — Telephone Encounter (Signed)
Pt c/o BP issue: STAT if pt c/o blurred vision, one-sided weakness or slurred speech ? ?1. What are your last 5 BP readings?  ?157/88 ?152/99 ? ?2. Are you having any other symptoms (ex. Dizziness, headache, blurred vision, passed out)? Blurry vision. Lightheaded. Pressure in head. Kind of like a headache but not quite a headache. Patient felt a little better after she laid down  ? ?3. What is your BP issue? Patient concerned about her BP  ?

## 2021-05-10 NOTE — Progress Notes (Signed)
? ?Acute Office Visit ? ?Subjective:  ? ?  ?Patient ID: Kathy Ewing, female    DOB: December 09, 1970, 51 y.o.   MRN: [id number] ? ?Chief Complaint  ?Patient presents with  ? Hypertension  ? ? ?HPI ?Patient is in today for HTN. She reports that she has not been feeling well for the last 3 days. She has had a headache that feels like a pressure. It is frontal. She also reports intermittent blurry vision that lasts for a few minutes. She has had intermittently lightheadedness that improves with rest. She has been checking her BP when this happens. Her BP has been 152/99 and 159/81 when this happens. When she feels better her BP comes back down. She also has been intermittetly nausea and had slight sensitivity to light. She has a history of migraines but reports that this feels different. She has not taken anything for her symptoms. Denies congestion.  ? ?She has been referred to cardiology for chest pain. She reports that this has been intermittent and feels more like a numbness. This is occurring currently. She has had the appt with cardiology who felt like the pain is muscular in etiology. She does have a CTA scheduled for Thursday.  ? ?Denies worsening edema, orthopnea, palpations, focal weakness, changes in speech or gait, or confusion. No vomiting.  ? ? ?BP has been elevated for last 3 days. 152/99 this morning. 159/81  ? ?ROS ?As per HPI. ? ?   ?Objective:  ?  ?BP 127/78   Pulse 62   Temp 99 ?F (37.2 ?C) (Temporal)   Ht '5\' 6"'$  (1.676 m)   Wt 175 lb 2 oz (79.4 kg)   BMI 28.27 kg/m?  ?BP Readings from Last 3 Encounters:  ?05/10/21 127/78  ?05/03/21 (!) 146/88  ?04/01/21 121/85  ? ?  ? ?Physical Exam ?Vitals and nursing note reviewed.  ?Constitutional:   ?   General: She is not in acute distress. ?   Appearance: Normal appearance. She is not ill-appearing.  ?HENT:  ?   Right Ear: Tympanic membrane, ear canal and external ear normal.  ?   Left Ear: Tympanic membrane, ear canal and external ear normal.  ?Eyes:  ?    Pupils: Pupils are equal, round, and reactive to light.  ?Neck:  ?   Vascular: No carotid bruit or JVD.  ?Cardiovascular:  ?   Rate and Rhythm: Normal rate and regular rhythm.  ?   Pulses: Normal pulses.  ?   Heart sounds: Normal heart sounds. No murmur heard. ?Pulmonary:  ?   Effort: Pulmonary effort is normal. No respiratory distress.  ?   Breath sounds: Normal breath sounds.  ?Abdominal:  ?   General: Bowel sounds are normal. There is no distension.  ?   Palpations: Abdomen is soft. There is no mass.  ?   Tenderness: There is no abdominal tenderness. There is no guarding or rebound.  ?Musculoskeletal:  ?   Cervical back: Neck supple. No tenderness.  ?   Right lower leg: No edema.  ?   Left lower leg: No edema.  ?Lymphadenopathy:  ?   Cervical: No cervical adenopathy.  ?Skin: ?   General: Skin is warm and dry.  ?Neurological:  ?   General: No focal deficit present.  ?   Mental Status: She is alert and oriented to person, place, and time.  ?   Cranial Nerves: No cranial nerve deficit or facial asymmetry.  ?   Sensory: Sensation is intact. No  sensory deficit.  ?   Motor: No weakness, tremor or abnormal muscle tone.  ?   Coordination: Coordination normal. Finger-Nose-Finger Test normal. Rapid alternating movements normal.  ?   Gait: Gait normal.  ?   Deep Tendon Reflexes: Reflexes are normal and symmetric.  ?Psychiatric:     ?   Mood and Affect: Mood normal.     ?   Behavior: Behavior normal.  ? ? ?No results found for any visits on 05/10/21. ? ? ?   ?Assessment & Plan:  ? ?Adreona was seen today for hypertension. ? ?Diagnoses and all orders for this visit: ? ?Blurred ?Dizziness ?Symptoms occurring intermittently with HA for last 3 days. Reprots frontal pressure with mild light sensitivity and nausea. Symptoms all improve with rest. No neuro deficit on exma. Discussed likely atypical migraine. Nurtec samples given for migraine. Strict return precautions given. Discussed when to seek emergency care.  ? ?Other migraine  with status migrainosus, intractable ?Nurtec samples given today.  ? ?Atypical chest pain ?EKG unchanged from previous. Reviewed cardiology note- likely muscular. Has cardiac CT scheduled in 2 days.  ?-     EKG 12-Lead ? ?Primary hypertension ?Well controlled in office today. Discussed BP likely elevated at home due to headache. Continue to monitor at home. Notify for new or worsening symptoms.  ? ? ?The patient indicates understanding of these issues and agrees with the plan. ? ?Gwenlyn Perking, FNP ? ? ?

## 2021-05-10 NOTE — Patient Instructions (Signed)

## 2021-05-10 NOTE — Telephone Encounter (Signed)
Patient called and said that Blood Pressure was 153/99, dizzy and blurred vision and headache. Patient reports she has rechecked it and gone down some. Made appt for evaluation ? ?Ancel Easler. LPN   ?

## 2021-05-10 NOTE — Telephone Encounter (Signed)
Patient states she has had intermittent blurred vision and elevated BP over last 2 days. Said the top of her head felt like it " is on fire" .She states this is not anything like her migraine HA or any anxiety she has felt in the past. ?She cannot tell when she had her eyes examined last. ? ? ? ?BP at 05/03/21 office visit was 146/88. She has cardiac ct scheduled this Thursday, 05/13/21. ? ? ?She is going to call her PCP this am. I will FYI Dr.Nishan. ? ?

## 2021-05-12 ENCOUNTER — Telehealth (HOSPITAL_COMMUNITY): Payer: Self-pay | Admitting: Emergency Medicine

## 2021-05-12 NOTE — Telephone Encounter (Signed)
Attempted to call patient regarding upcoming cardiac CT appointment. °Left message on voicemail with name and callback number °Endi Lagman RN Navigator Cardiac Imaging ° [facility] °[phone] Office °[phone] Cell ° °

## 2021-05-13 ENCOUNTER — Ambulatory Visit (HOSPITAL_COMMUNITY)
Admission: RE | Admit: 2021-05-13 | Discharge: 2021-05-13 | Disposition: A | Discharge status: Home / Self Care | Payer: BC Managed Care – PPO | Source: Ambulatory Visit | Attending: Cardiovascular Disease | Admitting: Cardiovascular Disease

## 2021-05-13 ENCOUNTER — Encounter (HOSPITAL_COMMUNITY): Payer: Self-pay

## 2021-05-13 DIAGNOSIS — R079 Chest pain, unspecified: Secondary | ICD-10-CM

## 2021-05-13 MED ORDER — NITROGLYCERIN 0.4 MG SL SUBL
0.8000 mg | SUBLINGUAL_TABLET | Freq: Once | SUBLINGUAL | Status: AC
  Administered 2021-05-13: 0.8 mg via SUBLINGUAL

## 2021-05-13 MED ORDER — NITROGLYCERIN 0.4 MG SL SUBL
SUBLINGUAL_TABLET | SUBLINGUAL | Status: AC
  Filled 2021-05-13: qty 2

## 2021-05-13 MED ORDER — IOHEXOL 350 MG/ML SOLN
100.0000 mL | Freq: Once | INTRAVENOUS | Status: AC | PRN
  Administered 2021-05-13: 100 mL via INTRAVENOUS

## 2021-05-14 ENCOUNTER — Telehealth: Payer: Self-pay | Admitting: Cardiovascular Disease

## 2021-05-14 NOTE — Telephone Encounter (Signed)
Josue Hector, MD  ?05/13/2021  2:36 PM EDT   ?  ?Calcium score 0 no CAD great   ? ?

## 2021-05-14 NOTE — Telephone Encounter (Signed)
    Pt is returning call to get CT result 

## 2021-05-14 NOTE — Telephone Encounter (Signed)
Pt notified and verbalized understanding. Pt had no questions or concerns at this time.  

## 2021-06-10 ENCOUNTER — Encounter: Payer: Self-pay | Admitting: Family

## 2021-06-10 ENCOUNTER — Ambulatory Visit: Payer: BC Managed Care – PPO | Admitting: Family

## 2021-06-10 VITALS — BP 109/74 | HR 88 | Temp 98.1°F | Wt 173.4 lb

## 2021-06-10 DIAGNOSIS — M5441 Lumbago with sciatica, right side: Secondary | ICD-10-CM | POA: Diagnosis not present

## 2021-06-10 DIAGNOSIS — G43009 Migraine without aura, not intractable, without status migrainosus: Secondary | ICD-10-CM | POA: Diagnosis not present

## 2021-06-10 MED ORDER — PREDNISONE 10 MG (21) PO TBPK: ORAL_TABLET | ORAL | 0 refills | Status: DC

## 2021-06-10 MED ORDER — BACLOFEN 10 MG PO TABS: 10.0000 mg | ORAL_TABLET | Freq: Three times a day (TID) | ORAL | 0 refills | Status: DC

## 2021-06-10 MED ORDER — TOPIRAMATE 25 MG PO TABS: ORAL_TABLET | ORAL | 0 refills | Status: DC

## 2021-06-10 MED ORDER — DICLOFENAC SODIUM 75 MG PO TBEC: 75.0000 mg | DELAYED_RELEASE_TABLET | Freq: Two times a day (BID) | ORAL | 0 refills | Status: DC

## 2021-06-10 NOTE — Progress Notes (Signed)
Subjective:    Patient ID: Kathy Ewing, female    DOB: November 21, 1970, 51 y.o.   MRN: [id number]  Chief Complaint  Patient presents with   Headache    Getting worse   Pt presents to the office today with worsen headaches. She is taking delta 8 that has slightly helped, but feels like her headaches are more frequent.   She is also complaining of lumbar pain. 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 She has been doing home exercises without relief. Reports this pain was improving, but a few days ago it flared up.  Headache  This is a chronic problem. Episode frequency: 1-2 week. The problem has been unchanged. The pain quality is similar to prior headaches. The quality of the pain is described as throbbing and aching. The pain is at a severity of 10/10. The pain is moderate. Associated symptoms include back pain, blurred vision, nausea, numbness, phonophobia, photophobia, tingling, vomiting and weakness. She has tried darkened room and acetaminophen (delta 8) for the symptoms. The treatment provided mild relief.  Back Pain This is a chronic problem. The current episode started more than 1 year ago. The problem has been waxing and waning since onset. The pain is present in the lumbar spine. The quality of the pain is described as aching. The pain is at a severity of 7/10. The pain is moderate. The symptoms are aggravated by twisting and bending. Associated symptoms include headaches, leg pain, numbness, tingling and weakness. She has tried bed rest for the symptoms. The treatment provided mild relief.     Review of Systems  Eyes:  Positive for blurred vision and photophobia.  Gastrointestinal:  Positive for nausea and vomiting.  Musculoskeletal:  Positive for back pain.  Neurological:  Positive for tingling, weakness, numbness and headaches.   All other systems reviewed and are negative.     Objective:   Physical Exam Vitals reviewed.  Constitutional:      General: She is not in acute distress.    Appearance: She is well-developed. She is obese.  HENT:     Head: Normocephalic and atraumatic.     Right Ear: External ear normal.  Eyes:     Pupils: Pupils are equal, round, and reactive to light.  Neck:     Thyroid: No thyromegaly.  Cardiovascular:     Rate and Rhythm: Normal rate and regular rhythm.     Heart sounds: Normal heart sounds. No murmur heard. Pulmonary:     Effort: Pulmonary effort is normal. No respiratory distress.     Breath sounds: Normal breath sounds. No wheezing.  Abdominal:     General: Bowel sounds are normal. There is no distension.     Palpations: Abdomen is soft.     Tenderness: There is no abdominal tenderness.  Musculoskeletal:        General: No tenderness. Normal range of motion.     Cervical back: Normal range of motion and neck supple.  Skin:    General: Skin is warm and dry.  Neurological:     Mental Status: She is alert and oriented to person, place, and time.     Cranial Nerves: No cranial nerve deficit.     Deep Tendon Reflexes: Reflexes are normal and symmetric.  Psychiatric:        Behavior: Behavior normal.  Thought Content: Thought content normal.        Judgment: Judgment normal.    There were no vitals taken for this visit.       Assessment & Plan:  Kathy Ewing comes in today with chief complaint of Headache (Getting worse)   Diagnosis and orders addressed:  1. Acute bilateral low back pain with right-sided sciatica Rest ROM exercises No other NSAID"S while taking diclofenac  Sedation precaution  - baclofen (LIORESAL) 10 MG tablet; Take 1 tablet (10 mg total) by mouth 3 (three) times daily.  Dispense: 30 each; Refill: 0 - diclofenac (VOLTAREN) 75 MG EC tablet; Take 1 tablet (75 mg total) by mouth 2 (two) times daily.  Dispense: 30 tablet; Refill:  0 - predniSONE (STERAPRED UNI-PAK 21 TAB) 10 MG (21) TBPK tablet; Use as directed  Dispense: 21 tablet; Refill: 0  2. Migraine without aura and without status migrainosus, not intractable Topamax 25 mg BID then increase 50 mg BID  Limit caffeine  - topiramate (TOPAMAX) 25 MG tablet; Take 1 tablet (25 mg total) by mouth 2 (two) times daily for 14 days, THEN 2 tablets (50 mg total) 2 (two) times daily for 14 days.  Dispense: 84 tablet; Refill: 0   Health Maintenance reviewed Diet and exercise encouraged  Follow up plan: 1 month with PCP  [name], FNP

## 2021-06-10 NOTE — Patient Instructions (Signed)
Acute Back Pain, Adult Acute back pain is sudden and usually short-lived. It is often caused by an injury to the muscles and tissues in the back. The injury may result from: A muscle, tendon, or ligament getting overstretched or torn. Ligaments are tissues that connect bones to each other. Lifting something improperly can cause a back strain. Wear and tear (degeneration) of the spinal disks. Spinal disks are circular tissue that provide cushioning between the bones of the spine (vertebrae). Twisting motions, such as while playing sports or doing yard work. A hit to the back. Arthritis. You may have a physical exam, lab tests, and imaging tests to find the cause of your pain. Acute back pain usually goes away with rest and home care. Follow these instructions at home: Managing pain, stiffness, and swelling Take over-the-counter and prescription medicines only as told by your health care provider. Treatment may include medicines for pain and inflammation that are taken by mouth or applied to the skin, or muscle relaxants. Your health care provider may recommend applying ice during the first 24-48 hours after your pain starts. To do this: Put ice in a plastic bag. Place a towel between your skin and the bag. Leave the ice on for 20 minutes, 2-3 times a day. Remove the ice if your skin turns bright red. This is very important. If you cannot feel pain, heat, or cold, you have a greater risk of damage to the area. If directed, apply heat to the affected area as often as told by your health care provider. Use the heat source that your health care provider recommends, such as a moist heat pack or a heating pad. Place a towel between your skin and the heat source. Leave the heat on for 20-30 minutes. Remove the heat if your skin turns bright red. This is especially important if you are unable to feel pain, heat, or cold. You have a greater risk of getting burned. Activity  Do not stay in bed. Staying in  bed for more than 1-2 days can delay your recovery. Sit up and stand up straight. Avoid leaning forward when you sit or hunching over when you stand. If you work at a desk, sit close to it so you do not need to lean over. Keep your chin tucked in. Keep your neck drawn back, and keep your elbows bent at a 90-degree angle (right angle). Sit high and close to the steering wheel when you drive. Add lower back (lumbar) support to your car seat, if needed. Take short walks on even surfaces as soon as you are able. Try to increase the length of time you walk each day. Do not sit, drive, or stand in one place for more than 30 minutes at a time. Sitting or standing for long periods of time can put stress on your back. Do not drive or use heavy machinery while taking prescription pain medicine. Use proper lifting techniques. When you bend and lift, use positions that put less stress on your back: Bend your knees. Keep the load close to your body. Avoid twisting. Exercise regularly as told by your health care provider. Exercising helps your back heal faster and helps prevent back injuries by keeping muscles strong and flexible. Work with a physical therapist to make a safe exercise program, as recommended by your health care provider. Do any exercises as told by your physical therapist. Lifestyle Maintain a healthy weight. Extra weight puts stress on your back and makes it difficult to have good   posture. Avoid activities or situations that make you feel anxious or stressed. Stress and anxiety increase muscle tension and can make back pain worse. Learn ways to manage anxiety and stress, such as through exercise. General instructions Sleep on a firm mattress in a comfortable position. Try lying on your side with your knees slightly bent. If you lie on your back, put a pillow under your knees. Keep your head and neck in a straight line with your spine (neutral position) when using electronic equipment like  smartphones or pads. To do this: Raise your smartphone or pad to look at it instead of bending your head or neck to look down. Put the smartphone or pad at the level of your face while looking at the screen. Follow your treatment plan as told by your health care provider. This may include: Cognitive or behavioral therapy. Acupuncture or massage therapy. Meditation or yoga. Contact a health care provider if: You have pain that is not relieved with rest or medicine. You have increasing pain going down into your legs or buttocks. Your pain does not improve after 2 weeks. You have pain at night. You lose weight without trying. You have a fever or chills. You develop nausea or vomiting. You develop abdominal pain. Get help right away if: You develop new bowel or bladder control problems. You have unusual weakness or numbness in your arms or legs. You feel faint. These symptoms may represent a serious problem that is an emergency. Do not wait to see if the symptoms will go away. Get medical help right away. Call your local emergency services (911 in the [place] Do not drive yourself to the hospital. Summary Acute back pain is sudden and usually short-lived. Use proper lifting techniques. When you bend and lift, use positions that put less stress on your back. Take over-the-counter and prescription medicines only as told by your health care provider, and apply heat or ice as told. This information is not intended to replace advice given to you by your health care provider. Make sure you discuss any questions you have with your health care provider. Document Revised: 03/27/2020 Document Reviewed: 03/27/2020 Elsevier Patient Education  2023 [employer]  

## 2021-07-13 ENCOUNTER — Ambulatory Visit: Payer: BC Managed Care – PPO | Admitting: Family Medicine

## 2021-07-15 ENCOUNTER — Encounter: Payer: Self-pay | Admitting: Family Medicine

## 2021-08-26 ENCOUNTER — Encounter: Payer: Self-pay | Admitting: Family Medicine

## 2021-08-26 ENCOUNTER — Telehealth: Payer: Self-pay

## 2021-08-26 ENCOUNTER — Ambulatory Visit: Payer: 59 | Admitting: Family Medicine

## 2021-08-26 VITALS — BP 122/81 | HR 79 | Temp 98.1°F | Ht 66.0 in | Wt 172.4 lb

## 2021-08-26 DIAGNOSIS — G8929 Other chronic pain: Secondary | ICD-10-CM | POA: Diagnosis not present

## 2021-08-26 DIAGNOSIS — M5441 Lumbago with sciatica, right side: Secondary | ICD-10-CM | POA: Diagnosis not present

## 2021-08-26 DIAGNOSIS — G43009 Migraine without aura, not intractable, without status migrainosus: Secondary | ICD-10-CM | POA: Diagnosis not present

## 2021-08-26 DIAGNOSIS — M5442 Lumbago with sciatica, left side: Secondary | ICD-10-CM

## 2021-08-26 MED ORDER — DICLOFENAC SODIUM 75 MG PO TBEC: 75.0000 mg | DELAYED_RELEASE_TABLET | Freq: Two times a day (BID) | ORAL | 1 refills | Status: DC

## 2021-08-26 MED ORDER — NURTEC 75 MG PO TBDP: 75.0000 mg | ORAL_TABLET | ORAL | 2 refills | Status: DC

## 2021-08-26 MED ORDER — BACLOFEN 10 MG PO TABS: 10.0000 mg | ORAL_TABLET | Freq: Three times a day (TID) | ORAL | 1 refills | Status: DC

## 2021-08-26 MED ORDER — KETOROLAC TROMETHAMINE 60 MG/2ML IM SOLN
60.0000 mg | Freq: Once | INTRAMUSCULAR | Status: AC
  Administered 2021-08-26: 60 mg via INTRAMUSCULAR

## 2021-08-26 NOTE — Progress Notes (Signed)
Assessment & Plan:  1. Migraine without aura and without status migrainosus, not intractable Uncontrolled. Starting Nurtec. Toradol given in office today. - Rimegepant Sulfate (NURTEC) 75 MG TBDP; Take 75 mg by mouth every other day.  Dispense: 16 tablet; Refill: 2 - ketorolac (TORADOL) injection 60 mg  2. Chronic bilateral low back pain with bilateral sciatica Uncontrolled. Referring to neurosurgeon for possible injections. Refilled Diclofenac and Baclofen since she has been out of these. Toradol given in office today. - Ambulatory referral to Neurosurgery - ketorolac (TORADOL) injection 60 mg - baclofen (LIORESAL) 10 MG tablet; Take 1 tablet (10 mg total) by mouth 3 (three) times daily.  Dispense: 60 each; Refill: 1 - diclofenac (VOLTAREN) 75 MG EC tablet; Take 1 tablet (75 mg total) by mouth 2 (two) times daily.  Dispense: 60 tablet; Refill: 1   Follow up plan: Return 3-4 weeks, for follow-up of chronic medication conditions with PCP.  Hendricks Limes, MSN, APRN, FNP-C [facility]  Subjective:   Patient ID: Kathy Ewing, female    DOB: Jun 09, 1970, 51 y.o.   MRN: [id number]  HPI: Kathy Ewing is a 51 y.o. female presenting on 08/26/2021 for Migraine (Ongoing but now having them 4 days at a time. ) and Back Pain (Lower back pain that has been ongoing but getting worse by the day. )  Patient reports ongoing migraines occurring at least four days a week. She is effected by a headache more than half the month (16/30 days). She has previously failed treatment with Topamax, Depakote, Imitrex, Maxalt, Amerge, Frova, and Fioricet. She reports Delta-8 is helpful, but she "can't do this all the time".   She also reports chronic low back pain that is getting worse. The pain is so bad she fell to the floor the other day due to the pain. She has previously done physical therapy, which she reports was not helpful. She has previously taken Diclofenac and Baclofen which  reduced the pain a little bit, but not enough. She has had a MRI of her lumbar spine in February that showed mild lower lumbar degenerative disc disease without spinal canal or neural foraminal stenosis.    ROS: Negative unless specifically indicated above in HPI.   Relevant past medical history reviewed and updated as indicated.   Allergies and medications reviewed and updated.   Current Outpatient Medications:    ALPRAZolam (XANAX) 0.5 MG tablet, TAKE 1 OR 2 TABLETS BY MOUTH TWICE DAILY IF NEEDED FOR ANXIETY, Disp: 60 tablet, Rfl: 5   atorvastatin (LIPITOR) 40 MG tablet, Take 1 tablet (40 mg total) by mouth daily., Disp: 90 tablet, Rfl: 3   fluticasone (FLONASE) 50 MCG/ACT nasal spray, Place 2 sprays into both nostrils daily., Disp: 16 g, Rfl: 6   ondansetron (ZOFRAN-ODT) 4 MG disintegrating tablet, Take 1 tablet (4 mg total) by mouth every 8 (eight) hours as needed for nausea or vomiting., Disp: 20 tablet, Rfl: 0   baclofen (LIORESAL) 10 MG tablet, Take 1 tablet (10 mg total) by mouth 3 (three) times daily. (Patient not taking: Reported on 08/26/2021), Disp: 30 each, Rfl: 0   diclofenac (VOLTAREN) 75 MG EC tablet, Take 1 tablet (75 mg total) by mouth 2 (two) times daily. (Patient not taking: Reported on 08/26/2021), Disp: 30 tablet, Rfl: 0   topiramate (TOPAMAX) 25 MG tablet, Take 1 tablet (25 mg total) by mouth 2 (two) times daily for 14 days, THEN 2 tablets (50 mg total) 2 (two) times daily for 14 days. (  Patient not taking: Reported on 08/26/2021), Disp: 84 tablet, Rfl: 0  Current Facility-Administered Medications:    cyanocobalamin ((VITAMIN B-12)) injection 1,000 mcg, 1,000 mcg, Intramuscular, Q30 days, Claretta Fraise, MD, 1,000 mcg at 05/25/20 1126  No Known Allergies  Objective:   BP 122/81   Pulse 79   Temp 98.1 F (36.7 C) (Temporal)   Ht '5\' 6"'$  (1.676 m)   Wt 172 lb 6.4 oz (78.2 kg)   LMP 08/25/2021 (Exact Date)   SpO2 99%   BMI 27.83 kg/m    Physical Exam Vitals  reviewed.  Constitutional:      General: She is not in acute distress.    Appearance: Normal appearance. She is not ill-appearing, toxic-appearing or diaphoretic.  HENT:     Head: Normocephalic and atraumatic.  Eyes:     General: No scleral icterus.       Right eye: No discharge.        Left eye: No discharge.     Conjunctiva/sclera: Conjunctivae normal.  Cardiovascular:     Rate and Rhythm: Normal rate.  Pulmonary:     Effort: Pulmonary effort is normal. No respiratory distress.  Musculoskeletal:     Cervical back: Normal range of motion.     Lumbar back: Tenderness and bony tenderness present. Positive right straight leg raise test and positive left straight leg raise test.  Skin:    General: Skin is warm and dry.     Capillary Refill: Capillary refill takes less than 2 seconds.  Neurological:     General: No focal deficit present.     Mental Status: She is alert and oriented to person, place, and time. Mental status is at baseline.  Psychiatric:        Mood and Affect: Mood normal.        Behavior: Behavior normal.        Thought Content: Thought content normal.        Judgment: Judgment normal.

## 2021-09-02 ENCOUNTER — Ambulatory Visit: Payer: Self-pay | Admitting: Family Medicine

## 2021-09-03 NOTE — Telephone Encounter (Signed)
Erroneous encounter

## 2021-09-28 ENCOUNTER — Ambulatory Visit (INDEPENDENT_AMBULATORY_CARE_PROVIDER_SITE_OTHER): Payer: 59 | Admitting: Family Medicine

## 2021-09-28 ENCOUNTER — Encounter: Payer: Self-pay | Admitting: Family Medicine

## 2021-09-28 VITALS — BP 125/75 | HR 79 | Temp 98.2°F | Ht 66.0 in | Wt 170.2 lb

## 2021-09-28 DIAGNOSIS — F419 Anxiety disorder, unspecified: Secondary | ICD-10-CM

## 2021-09-28 DIAGNOSIS — Z79899 Other long term (current) drug therapy: Secondary | ICD-10-CM | POA: Diagnosis not present

## 2021-09-28 DIAGNOSIS — M5416 Radiculopathy, lumbar region: Secondary | ICD-10-CM

## 2021-09-28 DIAGNOSIS — G43809 Other migraine, not intractable, without status migrainosus: Secondary | ICD-10-CM

## 2021-09-28 DIAGNOSIS — G8929 Other chronic pain: Secondary | ICD-10-CM

## 2021-09-28 MED ORDER — ESCITALOPRAM OXALATE 10 MG PO TABS: 10.0000 mg | ORAL_TABLET | Freq: Every day | ORAL | 1 refills | Status: DC

## 2021-09-28 MED ORDER — ALPRAZOLAM 0.5 MG PO TABS: ORAL_TABLET | ORAL | 5 refills | Status: DC

## 2021-09-28 NOTE — Progress Notes (Signed)
Subjective:  Patient ID: Kathy Ewing, female    DOB: Nov 05, 1970  Age: 51 y.o. MRN: [id number]  CC: Follow-up, Headache, and Back Pain   HPI Kathy Ewing presents for using CBD that helps with HA. [name] Kitchen Concerned it came back positive for THC at last drug screen. Decided against doing drug screen. Will DC the xanax as a result. Not using it very often, but would like something for anxiety.      09/28/2021    1:56 PM 06/10/2021    3:10 PM 05/10/2021    1:22 PM  Depression screen PHQ 2/9  Decreased Interest 0 2 1  Down, Depressed, Hopeless 0 0 0  PHQ - 2 Score 0 2 1  Altered sleeping  1 1  Tired, decreased energy  0 1  Change in appetite  0 0  Feeling bad or failure about yourself   0 0  Trouble concentrating  0 0  Moving slowly or fidgety/restless  0 0  Suicidal thoughts  0 0  PHQ-9 Score  3 3  Difficult doing work/chores  Not difficult at all Somewhat difficult    History Kathy Ewing has a past medical history of Anxiety, B12 deficiency, Cholelithiasis, GERD (gastroesophageal reflux disease), Headache, High cholesterol, Numbness, Pneumonia, and Vitamin D deficiency.   Kathy Ewing has a past surgical history that includes Cesarean section and Cholecystectomy (N/A, 10/02/2014).   Her family history includes Anxiety disorder in her brother; Cerebral palsy in her daughter; Depression in her daughter; Hypertension in her mother; Migraines in her sister; Multiple sclerosis in her sister.Kathy Ewing reports that Kathy Ewing has been smoking cigarettes. Kathy Ewing has a 15.00 pack-year smoking history. Kathy Ewing has never used smokeless tobacco. Kathy Ewing reports that Kathy Ewing does not drink alcohol and does not use drugs.    ROS Review of Systems  Constitutional: Negative.   HENT: Negative.    Respiratory:  Negative for shortness of breath.   Gastrointestinal:  Negative for abdominal pain.  Musculoskeletal:  Positive for back pain. Negative for arthralgias.  Neurological:  Positive for headaches.  Psychiatric/Behavioral:  The  patient is nervous/anxious.     Objective:  BP 125/75   Pulse 79   Temp 98.2 F (36.8 C)   Ht '5\' 6"'$  (1.676 m)   Wt 170 lb 3.2 oz (77.2 kg)   SpO2 97%   BMI 27.47 kg/m   BP Readings from Last 3 Encounters:  09/28/21 125/75  08/26/21 122/81  06/10/21 109/74    Wt Readings from Last 3 Encounters:  09/28/21 170 lb 3.2 oz (77.2 kg)  08/26/21 172 lb 6.4 oz (78.2 kg)  06/10/21 173 lb 6.4 oz (78.7 kg)     Physical Exam Constitutional:      General: Kathy Ewing is not in acute distress.    Appearance: Kathy Ewing is well-developed.  Cardiovascular:     Rate and Rhythm: Normal rate and regular rhythm.  Pulmonary:     Breath sounds: Normal breath sounds.  Musculoskeletal:        General: Normal range of motion.  Skin:    General: Skin is warm and dry.  Neurological:     Mental Status: Kathy Ewing is alert and oriented to person, place, and time.       Assessment & Plan:   [name] was seen today for follow-up, headache and back pain.  Diagnoses and all orders for this visit:  Other migraine without status migrainosus, not intractable  Anxiety -     Discontinue: ALPRAZolam (XANAX) 0.5 MG tablet; TAKE 1 OR  2 TABLETS BY MOUTH TWICE DAILY IF NEEDED FOR ANXIETY  Controlled substance agreement signed  Chronic radicular lumbar pain  Other orders -     escitalopram (LEXAPRO) 10 MG tablet; Take 1 tablet (10 mg total) by mouth daily.       I have discontinued Kade D. Berline Lopes "[name]"'s ALPRAZolam and ALPRAZolam. I am also having her start on escitalopram. Additionally, I am having her maintain her atorvastatin, ondansetron, fluticasone, Nurtec, baclofen, and diclofenac. We will continue to administer cyanocobalamin.  Allergies as of 09/28/2021   No Known Allergies      Medication List        Accurate as of September 28, 2021  5:18 PM. If you have any questions, ask your nurse or doctor.          STOP taking these medications    ALPRAZolam 0.5 MG tablet Commonly known as:  XANAX Stopped by: Claretta Fraise, MD       TAKE these medications    atorvastatin 40 MG tablet Commonly known as: LIPITOR Take 1 tablet (40 mg total) by mouth daily.   baclofen 10 MG tablet Commonly known as: LIORESAL Take 1 tablet (10 mg total) by mouth 3 (three) times daily.   diclofenac 75 MG EC tablet Commonly known as: VOLTAREN Take 1 tablet (75 mg total) by mouth 2 (two) times daily.   escitalopram 10 MG tablet Commonly known as: LEXAPRO Take 1 tablet (10 mg total) by mouth daily. Started by: Claretta Fraise, MD   fluticasone 50 MCG/ACT nasal spray Commonly known as: FLONASE Place 2 sprays into both nostrils daily.   Nurtec 75 MG Tbdp Generic drug: Rimegepant Sulfate Take 75 mg by mouth every other day.   ondansetron 4 MG disintegrating tablet Commonly known as: ZOFRAN-ODT Take 1 tablet (4 mg total) by mouth every 8 (eight) hours as needed for nausea or vomiting.         Follow-up: No follow-ups on file.  Claretta Fraise, M.D.

## 2022-01-19 ENCOUNTER — Telehealth: Payer: Self-pay

## 2022-01-19 NOTE — Telephone Encounter (Signed)
Patient called and I returned called.   Patient is c/o menstrual bleeding x 1 month - she states that today she is only spotting.  She is also having LBP that has been worse than her chronic back pain x the last couple of days.  Last night patient felt like she was going to pass out while she was in the tub - she got out and sat on toilet then went to the floor - she states feeling last for a few mins but she never had LOC. Did not have an appointment for patient today - made one with her PCP (Kathy Ewing) for tomorrow 01/20/2022 @ 155.  Advised patient to call with any questions or concerns before then. Also discussed red flag symptoms and to go to ED if she has extreme pain in back that she can not tolerate, heavy bleeding, or if she feels faint again. Pt agreed and expressed understanding.

## 2022-01-20 ENCOUNTER — Encounter: Payer: Self-pay | Admitting: Family Medicine

## 2022-01-20 ENCOUNTER — Ambulatory Visit: Payer: 59 | Admitting: Family Medicine

## 2022-01-20 VITALS — BP 132/78 | HR 90 | Temp 98.3°F | Ht 66.0 in | Wt 168.2 lb

## 2022-01-20 DIAGNOSIS — N920 Excessive and frequent menstruation with regular cycle: Secondary | ICD-10-CM | POA: Diagnosis not present

## 2022-01-20 DIAGNOSIS — N921 Excessive and frequent menstruation with irregular cycle: Secondary | ICD-10-CM | POA: Diagnosis not present

## 2022-01-20 LAB — URINALYSIS
Bilirubin, UA: NEGATIVE
Glucose, UA: NEGATIVE
Ketones, UA: NEGATIVE
Leukocytes,UA: NEGATIVE
Nitrite, UA: NEGATIVE
Protein,UA: NEGATIVE
Specific Gravity, UA: 1.01 (ref 1.005–1.030)
Urobilinogen, Ur: 0.2 mg/dL (ref 0.2–1.0)
pH, UA: 5.5 (ref 5.0–7.5)

## 2022-01-20 LAB — WET PREP FOR TRICH, YEAST, CLUE
Clue Cell Exam: NEGATIVE
Trichomonas Exam: NEGATIVE
Yeast Exam: NEGATIVE

## 2022-01-20 NOTE — Progress Notes (Signed)
Subjective:  Patient ID: Kathy Ewing, female    DOB: 1970-08-26  Age: 52 y.o. MRN: [id number]  CC: Menstrual Problem and Back Pain   HPI Kathy Ewing presents for Normal onset of menses last month, however it has continued for the whole month. Now decreased to spotting. Associated with moderate to severe, beginning to ease off. Sexually active.      01/20/2022    2:00 PM 09/28/2021    1:56 PM 06/10/2021    3:10 PM  Depression screen PHQ 2/9  Decreased Interest 0 0 2  Down, Depressed, Hopeless 0 0 0  PHQ - 2 Score 0 0 2  Altered sleeping 0  1  Tired, decreased energy 1  0  Change in appetite 0  0  Feeling bad or failure about yourself  0  0  Trouble concentrating 0  0  Moving slowly or fidgety/restless 0  0  Suicidal thoughts 0  0  PHQ-9 Score 1  3  Difficult doing work/chores Not difficult at all  Not difficult at all    History Kathy Ewing has a past medical history of Anxiety, B12 deficiency, Cholelithiasis, GERD (gastroesophageal reflux disease), Headache, High cholesterol, Numbness, Pneumonia, and Vitamin D deficiency.   She has a past surgical history that includes Cesarean section and Cholecystectomy (N/A, 10/02/2014).   Her family history includes Anxiety disorder in her brother; Cerebral palsy in her daughter; Depression in her daughter; Hypertension in her mother; Migraines in her sister; Multiple sclerosis in her sister.She reports that she has been smoking cigarettes. She has a 15.00 pack-year smoking history. She has never used smokeless tobacco. She reports that she does not drink alcohol and does not use drugs.    ROS Review of Systems  Constitutional: Negative.   HENT: Negative.    Eyes:  Negative for visual disturbance.  Respiratory:  Negative for shortness of breath.   Cardiovascular:  Negative for chest pain.  Gastrointestinal:  Negative for abdominal pain.  Genitourinary:  Positive for menstrual problem, pelvic pain and vaginal bleeding. Negative for  dysuria, hematuria and vaginal discharge.  Musculoskeletal:  Negative for arthralgias.    Objective:  BP 132/78   Pulse 90   Temp 98.3 F (36.8 C)   Ht '5\' 6"'$  (1.676 m)   Wt 168 lb 3.2 oz (76.3 kg)   SpO2 97%   BMI 27.15 kg/m   BP Readings from Last 3 Encounters:  01/20/22 132/78  09/28/21 125/75  08/26/21 122/81    Wt Readings from Last 3 Encounters:  01/20/22 168 lb 3.2 oz (76.3 kg)  09/28/21 170 lb 3.2 oz (77.2 kg)  08/26/21 172 lb 6.4 oz (78.2 kg)     Physical Exam Exam conducted with a chaperone present.  Constitutional:      General: She is not in acute distress.    Appearance: She is well-developed.  Cardiovascular:     Rate and Rhythm: Normal rate and regular rhythm.  Pulmonary:     Breath sounds: Normal breath sounds.  Genitourinary:    General: Normal vulva.     Exam position: Lithotomy position.     Pubic Area: No rash.      Labia:        Right: No rash or lesion.        Left: No rash or lesion.      Vagina: Bleeding present. No vaginal discharge, erythema, tenderness, lesions or prolapsed vaginal walls.     Cervix: Cervical bleeding (minimal) present.  Musculoskeletal:  General: Normal range of motion.  Skin:    General: Skin is warm and dry.  Neurological:     Mental Status: She is alert and oriented to person, place, and time.       Assessment & Plan:   Kathy Ewing was seen today for menstrual problem and back pain.  Diagnoses and all orders for this visit:  Menorrhagia with regular cycle -     CBC with Differential/Platelet -     FSH/LH -     Urinalysis -     Urine Culture  Metrorrhagia -     CBC with Differential/Platelet -     FSH/LH -     Urinalysis -     Urine Culture -     WET PREP FOR TRICH, YEAST, CLUE       I am having Kathy Ewing "[name]" maintain her atorvastatin, ondansetron, fluticasone, Nurtec, baclofen, diclofenac, and escitalopram. We will continue to administer cyanocobalamin.  Allergies as of  01/20/2022   No Known Allergies      Medication List        Accurate as of January 20, 2022 11:59 PM. If you have any questions, ask your nurse or doctor.          atorvastatin 40 MG tablet Commonly known as: LIPITOR Take 1 tablet (40 mg total) by mouth daily.   baclofen 10 MG tablet Commonly known as: LIORESAL Take 1 tablet (10 mg total) by mouth 3 (three) times daily.   diclofenac 75 MG EC tablet Commonly known as: VOLTAREN Take 1 tablet (75 mg total) by mouth 2 (two) times daily.   escitalopram 10 MG tablet Commonly known as: LEXAPRO Take 1 tablet (10 mg total) by mouth daily.   fluticasone 50 MCG/ACT nasal spray Commonly known as: FLONASE Place 2 sprays into both nostrils daily.   Nurtec 75 MG Tbdp Generic drug: Rimegepant Sulfate Take 75 mg by mouth every other day.   ondansetron 4 MG disintegrating tablet Commonly known as: ZOFRAN-ODT Take 1 tablet (4 mg total) by mouth every 8 (eight) hours as needed for nausea or vomiting.         Follow-up: Return in about 2 weeks (around 02/03/2022), or if symptoms worsen or fail to improve.  Claretta Fraise, M.D.

## 2022-01-21 LAB — CBC WITH DIFFERENTIAL/PLATELET
Basophils Absolute: 0.1 10*3/uL (ref 0.0–0.2)
Basos: 1 %
EOS (ABSOLUTE): 0.2 10*3/uL (ref 0.0–0.4)
Eos: 2 %
Hematocrit: 41.2 % (ref 34.0–46.6)
Hemoglobin: 13.4 g/dL (ref 11.1–15.9)
Immature Grans (Abs): 0 10*3/uL (ref 0.0–0.1)
Immature Granulocytes: 0 %
Lymphocytes Absolute: 1.7 10*3/uL (ref 0.7–3.1)
Lymphs: 21 %
MCH: 27.9 pg (ref 26.6–33.0)
MCHC: 32.5 g/dL (ref 31.5–35.7)
MCV: 86 fL (ref 79–97)
Monocytes Absolute: 0.8 10*3/uL (ref 0.1–0.9)
Monocytes: 10 %
Neutrophils Absolute: 5.2 10*3/uL (ref 1.4–7.0)
Neutrophils: 66 %
Platelets: 325 10*3/uL (ref 150–450)
RBC: 4.81 x10E6/uL (ref 3.77–5.28)
RDW: 14.5 % (ref 11.7–15.4)
WBC: 7.9 10*3/uL (ref 3.4–10.8)

## 2022-01-21 LAB — FSH/LH
FSH: 4.9 m[IU]/mL
LH: 6.5 m[IU]/mL

## 2022-01-22 LAB — URINE CULTURE

## 2022-01-22 NOTE — Progress Notes (Signed)
Hello Kathy Ewing,  Your lab result is normal and/or stable.Some minor variations that are not significant are commonly marked abnormal, but do not represent any medical problem for you.  Best regards, Sahithi Ordoyne, M.D.

## 2022-01-24 ENCOUNTER — Telehealth: Payer: Self-pay | Admitting: Family Medicine

## 2022-01-24 ENCOUNTER — Other Ambulatory Visit: Payer: Self-pay | Admitting: Family Medicine

## 2022-01-24 ENCOUNTER — Ambulatory Visit (HOSPITAL_COMMUNITY): Admission: RE | Admit: 2022-01-24 | Payer: BC Managed Care – PPO | Source: Ambulatory Visit

## 2022-01-24 DIAGNOSIS — N921 Excessive and frequent menstruation with irregular cycle: Secondary | ICD-10-CM

## 2022-01-24 MED ORDER — MEDROXYPROGESTERONE ACETATE 10 MG PO TABS: 10.0000 mg | ORAL_TABLET | Freq: Every day | ORAL | 0 refills | Status: DC

## 2022-01-24 NOTE — Telephone Encounter (Signed)
I wrote for 10 days of provera. (Is she taking depoprovera?)If not - take the pill daily for 10 days. It should stop the bleeding, but a period may occur soon after finishing it. I also ordered a pelvic US for ASAP

## 2022-01-24 NOTE — Telephone Encounter (Signed)
Patient has started bleeding again, said that she saw the results on MyChart that everything was normal. Would like to speak to nurse. Please call back and advise

## 2022-01-24 NOTE — Telephone Encounter (Signed)
Patient aware and verbalizes understanding. 

## 2022-01-25 ENCOUNTER — Ambulatory Visit (HOSPITAL_COMMUNITY)
Admission: RE | Admit: 2022-01-25 | Discharge: 2022-01-25 | Disposition: A | Discharge status: Home / Self Care | Payer: 59 | Source: Ambulatory Visit | Attending: Family Medicine | Admitting: Family Medicine

## 2022-01-25 ENCOUNTER — Encounter: Payer: Self-pay | Admitting: Family Medicine

## 2022-01-25 DIAGNOSIS — N921 Excessive and frequent menstruation with irregular cycle: Secondary | ICD-10-CM

## 2022-01-26 ENCOUNTER — Telehealth: Payer: Self-pay | Admitting: Family Medicine

## 2022-01-26 NOTE — Telephone Encounter (Signed)
US showed there is some buil up to the lining of the uterus. Nothing dangerous. It may take 4-5 more days for the bleeding to stop.

## 2022-01-26 NOTE — Telephone Encounter (Signed)
Pt called to get [place] results and also let Dr Livia Snellen know that she has been taking the medicine he prescribed her to help with the bleeding and says she is still bleeding, just not as heavy. Unsure of how long she is supposed to take the medicine before it stops her from bleeding. Please advise on this and also call pt with [place] results.

## 2022-01-26 NOTE — Telephone Encounter (Signed)
Patient aware.

## 2022-02-07 NOTE — Telephone Encounter (Signed)
Never stopped, wasn't heavy, more spotting but just started bleeding like a regular period again.

## 2022-02-07 NOTE — Telephone Encounter (Signed)
After you finish the pills, you are supposed to have a period. It should last 3-5 days. It is a "reset" treatment.

## 2022-02-07 NOTE — Telephone Encounter (Signed)
Patient aware

## 2022-02-07 NOTE — Telephone Encounter (Signed)
Pt called stating that she is still bleeding and is very worried. Says she can't see the specialist until next week but needs advise on what to do in the meantime.

## 2022-02-07 NOTE — Telephone Encounter (Signed)
Did the bleeding stop at all toward the end of the ten days of using the medication?

## 2022-02-15 ENCOUNTER — Encounter: Payer: 59 | Admitting: Obstetrics & Gynecology

## 2022-02-21 ENCOUNTER — Encounter: Payer: Self-pay | Admitting: Obstetrics & Gynecology

## 2022-02-21 ENCOUNTER — Ambulatory Visit (INDEPENDENT_AMBULATORY_CARE_PROVIDER_SITE_OTHER): Payer: 59 | Admitting: Obstetrics & Gynecology

## 2022-02-21 ENCOUNTER — Other Ambulatory Visit (HOSPITAL_COMMUNITY)
Admission: RE | Admit: 2022-02-21 | Discharge: 2022-02-21 | Disposition: A | Discharge status: Home / Self Care | Payer: 59 | Source: Ambulatory Visit | Attending: Obstetrics & Gynecology | Admitting: Obstetrics & Gynecology

## 2022-02-21 VITALS — BP 132/82 | HR 74 | Ht 66.0 in | Wt 175.0 lb

## 2022-02-21 DIAGNOSIS — Z124 Encounter for screening for malignant neoplasm of cervix: Secondary | ICD-10-CM

## 2022-02-21 DIAGNOSIS — N938 Other specified abnormal uterine and vaginal bleeding: Secondary | ICD-10-CM

## 2022-02-21 MED ORDER — MEGESTROL ACETATE 40 MG PO TABS: ORAL_TABLET | ORAL | 0 refills | Status: DC

## 2022-02-21 MED ORDER — MEGESTROL ACETATE 40 MG PO TABS: ORAL_TABLET | ORAL | 11 refills | Status: DC

## 2022-02-21 NOTE — Progress Notes (Signed)
Follow up appointment for results: Sonogram   Chief Complaint  Patient presents with   irregular bleeding    Sometimes has twice a month, last one was prolonged.     Blood pressure 132/82, pulse 74, height '5\' 6"'$  (1.676 m), weight 175 lb (79.4 kg).  Usually  bleeds 6 days or so [facility] for 2 months varying volumes and colors Cramps in back were also severe  US PELVIC COMPLETE WITH TRANSVAGINAL  Result Date: 01/25/2022 CLINICAL DATA:  Menometrorrhagia. History of C-section x2. LMP 12/26/2021. EXAM: TRANSABDOMINAL AND TRANSVAGINAL ULTRASOUND OF PELVIS TECHNIQUE: Both transabdominal and transvaginal ultrasound examinations of the pelvis were performed. Transabdominal technique was performed for global imaging of the pelvis including uterus, ovaries, adnexal regions, and pelvic cul-de-sac. It was necessary to proceed with endovaginal exam following the transabdominal exam to visualize the endometrium and right ovary. COMPARISON:  Pelvic ultrasound 04/28/2014 FINDINGS: Uterus Measurements: 11.6 x 5.4 x 6.2 cm = volume: 201 mL. A 2.5 cm intramural (FIGO 5) leiomyoma is noted at the uterine fundus. Endometrium Thickness: 2 cm. No focal abnormality visualized. The appearance of the endometrium is consistent with secretory phase Right ovary Measurements: 0.9 x 2.6 x 2.4 cm = volume: 12.2 mL. Normal appearance. An anechoic circumscribed 2.7 cm cyst is noted, consistent with a normal ovarian follicle. No follow-up imaging is recommended. Left ovary Measurements: 2.3 x 1.6 x 2.5 cm = volume: 4.7 mL. Normal appearance/no adnexal mass. Other findings No abnormal free fluid. IMPRESSION: 1. Thickened endometrium with an appearance consistent with secretory phase. No focal abnormality visualized. 2. A 2.5 cm intramural leiomyoma is noted at the uterine fundus. Uterus is otherwise unremarkable. 3. Adnexa are within normal limits. Electronically Signed   By: Ileana Roup M.D.   On: 01/25/2022 11:39     General WDWN  female NAD Vulva:  normal appearing vulva with no masses, tenderness or lesions Vagina:  normal mucosa, no discharge Cervix:  Normal no lesions Uterus:  normal size, contour, position, consistency, mobility, non-tender Adnexa: ovaries:present,  normal adnexa in size, nontender and no masses   MEDS ordered this encounter: Meds ordered this encounter  Medications   megestrol (MEGACE) 40 MG tablet    Sig: 3 tablets a day for 5 days, 2 tablets a day for 5 days then 1 tablet daily    Dispense:  45 tablet    Refill:  0   megestrol (MEGACE) 40 MG tablet    Sig: Take 1 tablet daily x 21 days then off 7 days    Dispense:  21 tablet    Refill:  11    Orders for this encounter: No orders of the defined types were placed in this encounter.   Impression + Management Plan   ICD-10-CM   1. 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 Routine cervical smear  Z12.4 Cytology - PAP( Justin)      Follow Up: Return in about 6 months (around 08/22/2022), or if symptoms worsen or fail to improve, for Follow up, with Dr Elonda Husky.     All questions were answered.  Past Medical History:  Diagnosis Date   Anxiety    B12 deficiency    Cholelithiasis    GERD (gastroesophageal reflux disease)    Headache    migraine    High cholesterol    Numbness  Pneumonia    Vitamin D deficiency     Past Surgical History:  Procedure Laterality Date   CESAREAN SECTION     x 2   CHOLECYSTECTOMY N/A 10/02/2014   Procedure: LAPAROSCOPIC CHOLECYSTECTOMY;  Surgeon: Rolm Bookbinder, MD;  Location: [facility];  Service: General;  Laterality: N/A;    OB History   No obstetric history on file.     No Known Allergies  Social History   Socioeconomic History   Marital status: Married    Spouse name: Not on file   Number of children: Not on file   Years of education: Not on file    Highest education level: Not on file  Occupational History   Not on file  Tobacco Use   Smoking status: Every Day    Packs/day: 0.50    Years: 30.00    Total pack years: 15.00    Types: Cigarettes   Smokeless tobacco: Never  Vaping Use   Vaping Use: Some days  Substance and Sexual Activity   Alcohol use: No    Alcohol/week: 0.0 standard drinks of alcohol   Drug use: No   Sexual activity: Yes    Birth control/protection: Surgical  Other Topics Concern   Not on file  Social History Narrative   She works at [employer] as a Education officer, community.   Highest level education:  9th grade   She lives with husband, daughter, and son.    Social Determinants of Health   Financial Resource Strain: Not on file  Food Insecurity: Not on file  Transportation Needs: Not on file  Physical Activity: Not on file  Stress: Not on file  Social Connections: Not on file    Family History  Problem Relation Age of Onset   Hypertension Mother    Migraines Sister    Multiple sclerosis Sister        Deceased, 60   Anxiety disorder Brother        x 2   Cerebral palsy Daughter        Deceased, 11.5 years   Depression Daughter

## 2022-02-25 LAB — CYTOLOGY - PAP
Comment: NEGATIVE
Diagnosis: UNDETERMINED — AB
High risk HPV: NEGATIVE

## 2022-03-07 ENCOUNTER — Telehealth: Payer: Self-pay | Admitting: Obstetrics & Gynecology

## 2022-03-07 NOTE — Telephone Encounter (Signed)
Returned patient's call.  Concerned about pap results.  Informed based on current guidelines, she just needs a repeat pap in 3 years.  No further questions.

## 2022-03-07 NOTE — Telephone Encounter (Signed)
Pt would like to discuss results. Please advise.

## 2022-03-21 ENCOUNTER — Telehealth: Payer: Self-pay | Admitting: Family Medicine

## 2022-03-21 DIAGNOSIS — E782 Mixed hyperlipidemia: Secondary | ICD-10-CM

## 2022-03-21 MED ORDER — ATORVASTATIN CALCIUM 40 MG PO TABS: 40.0000 mg | ORAL_TABLET | Freq: Every day | ORAL | 0 refills | Status: DC

## 2022-03-21 NOTE — Telephone Encounter (Signed)
LMOVM refill sent to pharmacy 

## 2022-03-21 NOTE — Telephone Encounter (Signed)
Give her 11:25 tomorrow or 4:10 Wednesday

## 2022-03-21 NOTE — Telephone Encounter (Signed)
  Prescription Request  03/21/2022  Is this a "Controlled Substance" medicine? atorvastatin (LIPITOR) 40 MG tablet   Have you seen your PCP in the last 2 weeks? 03/30/2022 CPE Stacks  If YES, route message to pool  -  If NO, patient needs to be scheduled for appointment.  What is the name of the medication or equipment? atorvastatin (LIPITOR) 40 MG tablet   Have you contacted your pharmacy to request a refill? no   Which pharmacy would you like this sent to? [facility] in [place]   Patient notified that their request is being sent to the clinical staff for review and that they should receive a response within 2 business days.

## 2022-03-21 NOTE — Telephone Encounter (Signed)
Appointment given for tomorrow

## 2022-03-21 NOTE — Telephone Encounter (Signed)
Patient called stating that her insurance will end after this week. Tried rescheduling her appt for next week, to be seen sometime this week before her insurance terms but Dr Livia Snellen does not have any openings so she had to cancel the appt. Wants to know if Dr Livia Snellen can order to have her labs rechecked this week before her insurance terms?

## 2022-03-22 ENCOUNTER — Ambulatory Visit: Payer: 59 | Admitting: Family Medicine

## 2022-03-22 ENCOUNTER — Encounter: Payer: Self-pay | Admitting: Family Medicine

## 2022-03-22 VITALS — BP 129/84 | HR 74 | Temp 98.0°F | Ht 66.0 in | Wt 175.2 lb

## 2022-03-22 DIAGNOSIS — E559 Vitamin D deficiency, unspecified: Secondary | ICD-10-CM | POA: Diagnosis not present

## 2022-03-22 DIAGNOSIS — N921 Excessive and frequent menstruation with irregular cycle: Secondary | ICD-10-CM | POA: Diagnosis not present

## 2022-03-22 DIAGNOSIS — E538 Deficiency of other specified B group vitamins: Secondary | ICD-10-CM

## 2022-03-22 NOTE — Progress Notes (Signed)
Subjective:  Patient ID: Kathy Ewing, female    DOB: Dec 14, 1970  Age: 52 y.o. MRN: [id number]  CC: Medical Management of Chronic Issues   HPI Kathy Ewing presents for  in for follow-up of elevated cholesterol. Doing well without complaints on current medication. Denies side effects of statin including myalgia and arthralgia and nausea. Currently no chest pain, shortness of breath or other cardiovascular related symptoms noted.  Vitamin B 12 deficiency in the past. Not taking it anymore. Needs update. No stocking glove neuro change or tongue change        03/22/2022   11:43 AM 03/22/2022   11:32 AM 02/21/2022    1:51 PM  Depression screen PHQ 2/9  Decreased Interest 1 0 1  Down, Depressed, Hopeless 0 0 0  PHQ - 2 Score 1 0 1  Altered sleeping 0  0  Tired, decreased energy 0  0  Change in appetite 0  0  Feeling bad or failure about yourself  0  0  Trouble concentrating 0  0  Moving slowly or fidgety/restless 0  0  Suicidal thoughts 0  0  PHQ-9 Score 1  1  Difficult doing work/chores Not difficult at all      History Kathy Ewing has a past medical history of Anxiety, B12 deficiency, Cholelithiasis, GERD (gastroesophageal reflux disease), Headache, High cholesterol, Numbness, Pneumonia, and Vitamin D deficiency.   Kathy Ewing has a past surgical history that includes Cesarean section and Cholecystectomy (N/A, 10/02/2014).   Kathy Ewing family history includes Anxiety disorder in Kathy Ewing brother; Cerebral palsy in Kathy Ewing daughter; Depression in Kathy Ewing daughter; Hypertension in Kathy Ewing mother; Migraines in Kathy Ewing sister; Multiple sclerosis in Kathy Ewing sister.Kathy Ewing reports that Kathy Ewing has been smoking cigarettes. Kathy Ewing has a 15.00 pack-year smoking history. Kathy Ewing has never used smokeless tobacco. Kathy Ewing reports that Kathy Ewing does not drink alcohol and does not use drugs.    ROS Review of Systems  Constitutional: Negative.   HENT: Negative.    Eyes:  Negative for visual disturbance.  Respiratory:  Negative for shortness of breath.    Cardiovascular:  Negative for chest pain.  Gastrointestinal:  Negative for abdominal pain.  Musculoskeletal:  Negative for arthralgias.    Objective:  BP 129/84   Pulse 74   Temp 98 F (36.7 C)   Ht '5\' 6"'$  (1.676 m)   Wt 175 lb 3.2 oz (79.5 kg)   SpO2 100%   BMI 28.28 kg/m   BP Readings from Last 3 Encounters:  03/22/22 129/84  02/21/22 132/82  01/20/22 132/78    Wt Readings from Last 3 Encounters:  03/22/22 175 lb 3.2 oz (79.5 kg)  02/21/22 175 lb (79.4 kg)  01/20/22 168 lb 3.2 oz (76.3 kg)     Physical Exam Constitutional:      General: Kathy Ewing is not in acute distress.    Appearance: Kathy Ewing is well-developed.  Cardiovascular:     Rate and Rhythm: Normal rate and regular rhythm.  Pulmonary:     Breath sounds: Normal breath sounds.  Musculoskeletal:        General: Normal range of motion.  Skin:    General: Skin is warm and dry.  Neurological:     Mental Status: Kathy Ewing is alert and oriented to person, place, and time.       Assessment & Plan:   Bilen was seen today for medical management of chronic issues.  Diagnoses and all orders for this visit:  Vitamin B 12 deficiency -     CMP14+EGFR -  Vitamin B12  Vitamin D deficiency -     CMP14+EGFR -     VITAMIN D 25 Hydroxy (Vit-D Deficiency, Fractures)  Metrorrhagia -     CBC with Differential/Platelet -     CMP14+EGFR       I have discontinued Kathy D. Lona "[name]"'s ondansetron, Nurtec, escitalopram, and medroxyPROGESTERone. I am also having Kathy Ewing maintain Kathy Ewing fluticasone, megestrol, and atorvastatin. We will continue to administer cyanocobalamin.  Allergies as of 03/22/2022   No Known Allergies      Medication List        Accurate as of March 22, 2022  9:49 PM. If you have any questions, ask your nurse or doctor.          STOP taking these medications    escitalopram 10 MG tablet Commonly known as: LEXAPRO Stopped by: Claretta Fraise, MD   medroxyPROGESTERone 10 MG tablet Commonly  known as: Provera Stopped by: Claretta Fraise, MD   Nurtec 75 MG Tbdp Generic drug: Rimegepant Sulfate Stopped by: Claretta Fraise, MD   ondansetron 4 MG disintegrating tablet Commonly known as: ZOFRAN-ODT Stopped by: Claretta Fraise, MD       TAKE these medications    atorvastatin 40 MG tablet Commonly known as: LIPITOR Take 1 tablet (40 mg total) by mouth daily.   fluticasone 50 MCG/ACT nasal spray Commonly known as: FLONASE Place 2 sprays into both nostrils daily.   megestrol 40 MG tablet Commonly known as: MEGACE Take 1 tablet daily x 21 days then off 7 days What changed: Another medication with the same name was removed. Continue taking this medication, and follow the directions you see here. Changed by: Claretta Fraise, MD         Follow-up: Return in about 3 months (around 06/22/2022).  Claretta Fraise, M.D.

## 2022-03-23 LAB — CBC WITH DIFFERENTIAL/PLATELET
Basophils Absolute: 0.1 10*3/uL (ref 0.0–0.2)
Basos: 1 %
EOS (ABSOLUTE): 0.5 10*3/uL — ABNORMAL HIGH (ref 0.0–0.4)
Eos: 7 %
Hematocrit: 42.2 % (ref 34.0–46.6)
Hemoglobin: 13 g/dL (ref 11.1–15.9)
Immature Grans (Abs): 0 10*3/uL (ref 0.0–0.1)
Immature Granulocytes: 0 %
Lymphocytes Absolute: 2.1 10*3/uL (ref 0.7–3.1)
Lymphs: 29 %
MCH: 26.5 pg — ABNORMAL LOW (ref 26.6–33.0)
MCHC: 30.8 g/dL — ABNORMAL LOW (ref 31.5–35.7)
MCV: 86 fL (ref 79–97)
Monocytes Absolute: 0.7 10*3/uL (ref 0.1–0.9)
Monocytes: 10 %
Neutrophils Absolute: 3.8 10*3/uL (ref 1.4–7.0)
Neutrophils: 53 %
Platelets: 387 10*3/uL (ref 150–450)
RBC: 4.91 x10E6/uL (ref 3.77–5.28)
RDW: 13.2 % (ref 11.7–15.4)
WBC: 7.3 10*3/uL (ref 3.4–10.8)

## 2022-03-23 LAB — CMP14+EGFR
ALT: 11 IU/L (ref 0–32)
AST: 15 IU/L (ref 0–40)
Albumin/Globulin Ratio: 2 (ref 1.2–2.2)
Albumin: 4.3 g/dL (ref 3.9–4.9)
Alkaline Phosphatase: 89 IU/L (ref 44–121)
BUN/Creatinine Ratio: 9 (ref 9–23)
BUN: 8 mg/dL (ref 6–24)
Bilirubin Total: 0.5 mg/dL (ref 0.0–1.2)
CO2: 20 mmol/L (ref 20–29)
Calcium: 9.2 mg/dL (ref 8.7–10.2)
Chloride: 109 mmol/L — ABNORMAL HIGH (ref 96–106)
Creatinine, Ser: 0.9 mg/dL (ref 0.57–1.00)
Globulin, Total: 2.2 g/dL (ref 1.5–4.5)
Glucose: 84 mg/dL (ref 70–99)
Potassium: 4.4 mmol/L (ref 3.5–5.2)
Sodium: 144 mmol/L (ref 134–144)
Total Protein: 6.5 g/dL (ref 6.0–8.5)
eGFR: 78 mL/min/{1.73_m2} (ref >59)

## 2022-03-23 LAB — VITAMIN D 25 HYDROXY (VIT D DEFICIENCY, FRACTURES): Vit D, 25-Hydroxy: 34.9 ng/mL (ref 30.0–100.0)

## 2022-03-23 LAB — VITAMIN B12: Vitamin B-12: 339 pg/mL (ref 232–1245)

## 2022-03-23 NOTE — Progress Notes (Signed)
Hello Tajuana,  Your lab result is normal and/or stable.Some minor variations that are not significant are commonly marked abnormal, but do not represent any medical problem for you.  Best regards, Claretta Fraise, M.D.

## 2022-03-28 ENCOUNTER — Telehealth: Payer: Self-pay | Admitting: Obstetrics & Gynecology

## 2022-03-28 NOTE — Telephone Encounter (Signed)
Patient called states that she has questions about two medicines that Dr.Eure prescribed  a mth ago and wants someone to call her.

## 2022-03-30 ENCOUNTER — Encounter: Payer: 59 | Admitting: Family Medicine

## 2022-06-17 ENCOUNTER — Other Ambulatory Visit: Payer: Self-pay | Admitting: Family Medicine

## 2022-06-17 DIAGNOSIS — E782 Mixed hyperlipidemia: Secondary | ICD-10-CM

## 2022-07-16 ENCOUNTER — Other Ambulatory Visit: Payer: Self-pay | Admitting: Family Medicine

## 2022-07-16 DIAGNOSIS — E782 Mixed hyperlipidemia: Secondary | ICD-10-CM

## 2022-07-18 MED ORDER — ATORVASTATIN CALCIUM 40 MG PO TABS: 40.0000 mg | ORAL_TABLET | Freq: Every day | ORAL | 0 refills | Status: DC

## 2022-07-18 NOTE — Addendum Note (Signed)
Addended by: Julious Payer D on: 07/18/2022 10:32 AM   Modules accepted: Orders

## 2022-07-18 NOTE — Telephone Encounter (Signed)
Pt scheduled first available appt with Dr. Darlyn Read on 08/22/22, please send refill to pharm

## 2022-07-18 NOTE — Telephone Encounter (Signed)
Stacks pt NTBS 30-d given 06/17/22

## 2022-08-15 ENCOUNTER — Other Ambulatory Visit: Payer: Self-pay | Admitting: Family Medicine

## 2022-08-15 DIAGNOSIS — E782 Mixed hyperlipidemia: Secondary | ICD-10-CM

## 2022-08-22 ENCOUNTER — Encounter: Payer: Self-pay | Admitting: Family Medicine

## 2022-08-22 ENCOUNTER — Ambulatory Visit: Payer: 59 | Admitting: Family Medicine

## 2022-08-29 ENCOUNTER — Ambulatory Visit: Payer: 59 | Admitting: Family Medicine

## 2022-08-29 ENCOUNTER — Encounter: Payer: Self-pay | Admitting: Family Medicine

## 2022-08-29 VITALS — BP 137/81 | HR 69 | Temp 97.8°F | Ht 66.0 in | Wt 185.2 lb

## 2022-08-29 DIAGNOSIS — E538 Deficiency of other specified B group vitamins: Secondary | ICD-10-CM | POA: Diagnosis not present

## 2022-08-29 DIAGNOSIS — E782 Mixed hyperlipidemia: Secondary | ICD-10-CM

## 2022-08-29 DIAGNOSIS — R3589 Other polyuria: Secondary | ICD-10-CM

## 2022-08-29 DIAGNOSIS — E559 Vitamin D deficiency, unspecified: Secondary | ICD-10-CM

## 2022-08-29 DIAGNOSIS — R631 Polydipsia: Secondary | ICD-10-CM

## 2022-08-29 DIAGNOSIS — I10 Essential (primary) hypertension: Secondary | ICD-10-CM

## 2022-08-29 LAB — CBC WITH DIFFERENTIAL/PLATELET
Basophils Absolute: 0.1 10*3/uL (ref 0.0–0.2)
Basos: 1 %
EOS (ABSOLUTE): 0.6 10*3/uL — ABNORMAL HIGH (ref 0.0–0.4)
Eos: 7 %
Hematocrit: 45 % (ref 34.0–46.6)
Hemoglobin: 14.8 g/dL (ref 11.1–15.9)
Immature Grans (Abs): 0.1 10*3/uL (ref 0.0–0.1)
Immature Granulocytes: 1 %
Lymphocytes Absolute: 2.3 10*3/uL (ref 0.7–3.1)
Lymphs: 27 %
MCH: 29.2 pg (ref 26.6–33.0)
MCHC: 32.9 g/dL (ref 31.5–35.7)
MCV: 89 fL (ref 79–97)
Monocytes Absolute: 0.8 10*3/uL (ref 0.1–0.9)
Monocytes: 10 %
Neutrophils Absolute: 4.6 10*3/uL (ref 1.4–7.0)
Neutrophils: 54 %
Platelets: 305 10*3/uL (ref 150–450)
RBC: 5.06 x10E6/uL (ref 3.77–5.28)
RDW: 14.2 % (ref 11.7–15.4)
WBC: 8.4 10*3/uL (ref 3.4–10.8)

## 2022-08-29 LAB — URINALYSIS
Bilirubin, UA: NEGATIVE
Glucose, UA: NEGATIVE
Ketones, UA: NEGATIVE
Nitrite, UA: NEGATIVE
Protein,UA: NEGATIVE
Specific Gravity, UA: 1.02 (ref 1.005–1.030)
Urobilinogen, Ur: 0.2 mg/dL (ref 0.2–1.0)
pH, UA: 7 (ref 5.0–7.5)

## 2022-08-29 LAB — CMP14+EGFR
ALT: 11 IU/L (ref 0–32)
AST: 9 IU/L (ref 0–40)
Albumin: 4.2 g/dL (ref 3.9–4.9)
Alkaline Phosphatase: 113 IU/L (ref 44–121)
BUN/Creatinine Ratio: 10 (ref 9–23)
BUN: 9 mg/dL (ref 6–24)
Bilirubin Total: 0.3 mg/dL (ref 0.0–1.2)
CO2: 22 mmol/L (ref 20–29)
Calcium: 9.2 mg/dL (ref 8.7–10.2)
Chloride: 106 mmol/L (ref 96–106)
Creatinine, Ser: 0.89 mg/dL (ref 0.57–1.00)
Globulin, Total: 2.4 g/dL (ref 1.5–4.5)
Glucose: 89 mg/dL (ref 70–99)
Potassium: 4.4 mmol/L (ref 3.5–5.2)
Sodium: 143 mmol/L (ref 134–144)
Total Protein: 6.6 g/dL (ref 6.0–8.5)
eGFR: 79 mL/min/{1.73_m2} (ref >59)

## 2022-08-29 LAB — VITAMIN B12

## 2022-08-29 LAB — LIPID PANEL
Chol/HDL Ratio: 2.7 ratio (ref 0.0–4.4)
Cholesterol, Total: 122 mg/dL (ref 100–199)
HDL: 46 mg/dL (ref >39)
LDL Chol Calc (NIH): 61 mg/dL (ref 0–99)
Triglycerides: 73 mg/dL (ref 0–149)
VLDL Cholesterol Cal: 15 mg/dL (ref 5–40)

## 2022-08-29 LAB — VITAMIN D 25 HYDROXY (VIT D DEFICIENCY, FRACTURES)

## 2022-08-29 LAB — BAYER DCA HB A1C WAIVED: HB A1C (BAYER DCA - WAIVED): 5.4 % (ref 4.8–5.6)

## 2022-08-29 MED ORDER — ATORVASTATIN CALCIUM 40 MG PO TABS: 40.0000 mg | ORAL_TABLET | Freq: Every day | ORAL | 3 refills | Status: DC

## 2022-08-29 NOTE — Progress Notes (Signed)
Subjective:  Patient ID: Kathy Ewing, female    DOB: 25-Oct-1970  Age: 52 y.o. MRN: [id number]  CC: Medical Management of Chronic Issues   HPI Kathy Ewing presents for  in for follow-up of elevated cholesterol. Doing well without complaints on current medication. Denies side effects of statin including myalgia and arthralgia and nausea. Currently no chest pain, shortness of breath or other cardiovascular related symptoms noted.   Urinating constantly. Hungry all the time. Taking hormone for DUB.      08/29/2022    9:31 AM 03/22/2022   11:43 AM 03/22/2022   11:32 AM  Depression screen PHQ 2/9  Decreased Interest 0 1 0  Down, Depressed, Hopeless 0 0 0  PHQ - 2 Score 0 1 0  Altered sleeping  0   Tired, decreased energy  0   Change in appetite  0   Feeling bad or failure about yourself   0   Trouble concentrating  0   Moving slowly or fidgety/restless  0   Suicidal thoughts  0   PHQ-9 Score  1   Difficult doing work/chores  Not difficult at all     History Kathy Ewing has a past medical history of Anxiety, B12 deficiency, Cholelithiasis, GERD (gastroesophageal reflux disease), Headache, High cholesterol, Numbness, Pneumonia, and Vitamin D deficiency.   Kathy Ewing has a past surgical history that includes Cesarean section and Cholecystectomy (N/A, 10/02/2014).   Her family history includes Anxiety disorder in her brother; Cerebral palsy in her daughter; Depression in her daughter; Hypertension in her mother; Migraines in her sister; Multiple sclerosis in her sister.Kathy Ewing reports that Kathy Ewing has been smoking cigarettes. Kathy Ewing has a 15 pack-year smoking history. Kathy Ewing has never used smokeless tobacco. Kathy Ewing reports that Kathy Ewing does not drink alcohol and does not use drugs.    ROS Review of Systems  Constitutional: Negative.   HENT: Negative.    Eyes:  Negative for visual disturbance.  Respiratory:  Negative for shortness of breath.   Cardiovascular:  Negative for chest pain.  Gastrointestinal:   Negative for abdominal pain.  Endocrine: Positive for polydipsia, polyphagia and polyuria.  Musculoskeletal:  Positive for arthralgias and myalgias (chronic, all over).    Objective:  BP 137/81   Pulse 69   Temp 97.8 F (36.6 C)   Ht 5\' 6"  (1.676 m)   Wt 185 lb 3.2 oz (84 kg)   SpO2 98%   BMI 29.89 kg/m   BP Readings from Last 3 Encounters:  08/29/22 137/81  03/22/22 129/84  02/21/22 132/82    Wt Readings from Last 3 Encounters:  08/29/22 185 lb 3.2 oz (84 kg)  03/22/22 175 lb 3.2 oz (79.5 kg)  02/21/22 175 lb (79.4 kg)     Physical Exam Constitutional:      General: Kathy Ewing is not in acute distress.    Appearance: Kathy Ewing is well-developed.  Cardiovascular:     Rate and Rhythm: Normal rate and regular rhythm.  Pulmonary:     Breath sounds: Normal breath sounds.  Musculoskeletal:        General: Normal range of motion.  Skin:    General: Skin is warm and dry.  Neurological:     Mental Status: Kathy Ewing is alert and oriented to person, place, and time.       Assessment & Plan:   Kathy "Agustin Cree" was seen today for medical management of chronic issues.  Diagnoses and all orders for this visit:  Mixed hyperlipidemia -     Lipid panel -  atorvastatin (LIPITOR) 40 MG tablet; Take 1 tablet (40 mg total) by mouth daily.  Primary hypertension -     CBC with Differential/Platelet -     CMP14+EGFR  Vitamin B 12 deficiency -     Vitamin B12  Vitamin D deficiency -     VITAMIN D 25 Hydroxy (Vit-D Deficiency, Fractures)  Polyuria -     Bayer DCA Hb A1c Waived -     Urinalysis -     Urine Culture  Polydipsia -     Bayer DCA Hb A1c Waived -     Urinalysis -     Urine Culture       I have discontinued Kathy D. Trieu "[name]"'s fluticasone. I have also changed her atorvastatin. Additionally, I am having her maintain her megestrol. We will continue to administer cyanocobalamin.  Allergies as of 08/29/2022   No Known Allergies      Medication List         Accurate as of August 29, 2022 10:10 AM. If you have any questions, ask your nurse or doctor.          STOP taking these medications    fluticasone 50 MCG/ACT nasal spray Commonly known as: FLONASE Stopped by: Kathy Ewing       TAKE these medications    atorvastatin 40 MG tablet Commonly known as: LIPITOR Take 1 tablet (40 mg total) by mouth daily.   megestrol 40 MG tablet Commonly known as: MEGACE Take 1 tablet daily x 21 days then off 7 days         Follow-up: Return in about 1 month (around 09/29/2022).  Mechele Claude, M.D.

## 2022-08-30 ENCOUNTER — Other Ambulatory Visit: Payer: Self-pay

## 2022-08-30 MED ORDER — VITAMIN D (ERGOCALCIFEROL) 1.25 MG (50000 UNIT) PO CAPS: 50000.0000 [IU] | ORAL_CAPSULE | ORAL | 3 refills | Status: DC

## 2022-08-30 NOTE — Progress Notes (Signed)
Dear Janelle Floor, Your Vitamin D is  low. You need a prescription strength supplement I will send that in for you. Nurse, if at all possible, could you send in a prescription for the patient for vitamin D 50,000 units, 1 p.o. weekly #13 with 3 refills? Many thanks, WS

## 2022-09-01 ENCOUNTER — Encounter: Payer: Self-pay | Admitting: Obstetrics & Gynecology

## 2022-09-01 ENCOUNTER — Ambulatory Visit (INDEPENDENT_AMBULATORY_CARE_PROVIDER_SITE_OTHER): Payer: 59 | Admitting: Obstetrics & Gynecology

## 2022-09-01 VITALS — BP 140/92 | HR 70 | Ht 66.0 in | Wt 184.5 lb

## 2022-09-01 DIAGNOSIS — N3281 Overactive bladder: Secondary | ICD-10-CM | POA: Diagnosis not present

## 2022-09-01 DIAGNOSIS — N938 Other specified abnormal uterine and vaginal bleeding: Secondary | ICD-10-CM

## 2022-09-01 DIAGNOSIS — R350 Frequency of micturition: Secondary | ICD-10-CM

## 2022-09-01 LAB — POCT URINALYSIS DIPSTICK
Glucose, UA: NEGATIVE
Ketones, UA: NEGATIVE
Leukocytes, UA: NEGATIVE
Nitrite, UA: NEGATIVE
Protein, UA: NEGATIVE

## 2022-09-01 MED ORDER — SOLIFENACIN SUCCINATE 10 MG PO TABS: 10.0000 mg | ORAL_TABLET | Freq: Every day | ORAL | 11 refills | Status: DC

## 2022-09-01 MED ORDER — PROGESTERONE 200 MG PO CAPS: 200.0000 mg | ORAL_CAPSULE | Freq: Every day | ORAL | 11 refills | Status: DC

## 2022-09-01 NOTE — Progress Notes (Signed)
Follow up appointment for results: Megestrol therapy  Chief Complaint  Patient presents with   Follow-up    On Megace; urinary frequency; back pain is worse    Blood pressure (!) 140/92, pulse 70, height 5\' 6"  (1.676 m), weight 184 lb 8 oz (83.7 kg).  She is on megestrol 21 on/7 off which is working well but her apetite has increased so will try to use prometrium 200 mg 21 on 7 off  Also has ^urinary frequency, nocturia, limited time to make it to bathroom  MEDS ordered this encounter: Meds ordered this encounter  Medications   solifenacin (VESICARE) 10 MG tablet    Sig: Take 1 tablet (10 mg total) by mouth daily.    Dispense:  30 tablet    Refill:  11    Orders for this encounter: Orders Placed This Encounter  Procedures   POCT Urinalysis Dipstick    Impression + Management Plan   ICD-10-CM   1. 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 OAB (overactive bladder): trial of vesicare  N32.81     3. Urinary frequency  R35.0 POCT Urinalysis Dipstick      Follow Up: Return in about 3 months (around 12/02/2022) for Follow up, with Dr Despina Hidden.     All questions were answered.  Past Medical History:  Diagnosis Date   Anxiety    B12 deficiency    Cholelithiasis    GERD (gastroesophageal reflux disease)    Headache    migraine    High cholesterol    Numbness    Pneumonia    Vitamin D deficiency     Past Surgical History:  Procedure Laterality Date   CESAREAN SECTION     x 2   CHOLECYSTECTOMY N/A 10/02/2014   Procedure: LAPAROSCOPIC CHOLECYSTECTOMY;  Surgeon: Emelia Loron, MD;  Location: [facility] OR;  Service: General;  Laterality: N/A;    OB History     Gravida  4   Para  2   Term  2   Preterm      AB  2   Living  1      SAB  2   IAB      Ectopic      Multiple      Live Births  2           No Known Allergies  Social History   Socioeconomic History    Marital status: Married    Spouse name: Not on file   Number of children: Not on file   Years of education: Not on file   Highest education level: Not on file  Occupational History   Not on file  Tobacco Use   Smoking status: Every Day    Current packs/day: 0.50    Average packs/day: 0.5 packs/day for 30.0 years (15.0 ttl pk-yrs)    Types: Cigarettes   Smokeless tobacco: Never  Vaping Use   Vaping status: Some Days  Substance and Sexual Activity   Alcohol use: No    Alcohol/week: 0.0 standard drinks of alcohol   Drug use: Yes    Comment: CBD   Sexual activity: Yes    Birth control/protection: None  Other Topics Concern   Not on file  Social History Narrative   She works at [employer] as a Associate Professor.   Highest level education:  9th grade   She lives with husband, daughter, and son.  Social Determinants of Health   Financial Resource Strain: Not on file  Food Insecurity: Not on file  Transportation Needs: Not on file  Physical Activity: Not on file  Stress: Not on file  Social Connections: Unknown (05/31/2021)   Received from [facility], [facility]   Social Network    Social Network: Not on file    Family History  Problem Relation Age of Onset   Hypertension Mother    Anxiety disorder Brother        x 2   Migraines Sister    Multiple sclerosis Sister        Deceased, 12   Cerebral palsy Daughter        Deceased, 11.5 years   Depression Daughter

## 2022-09-23 NOTE — Patient Instructions (Signed)

## 2022-09-29 ENCOUNTER — Telehealth (INDEPENDENT_AMBULATORY_CARE_PROVIDER_SITE_OTHER): Payer: 59 | Admitting: Family Medicine

## 2022-09-29 DIAGNOSIS — K2100 Gastro-esophageal reflux disease with esophagitis, without bleeding: Secondary | ICD-10-CM

## 2022-09-29 MED ORDER — PANTOPRAZOLE SODIUM 40 MG PO TBEC: 40.0000 mg | DELAYED_RELEASE_TABLET | Freq: Every day | ORAL | 11 refills | Status: DC

## 2022-09-29 MED ORDER — ONDANSETRON 8 MG PO TBDP: 8.0000 mg | ORAL_TABLET | Freq: Four times a day (QID) | ORAL | 1 refills | Status: AC | PRN

## 2022-10-03 ENCOUNTER — Encounter: Payer: Self-pay | Admitting: Family Medicine

## 2022-10-03 NOTE — Progress Notes (Signed)
Subjective:    Patient ID: Kathy Ewing, female    DOB: [birth year], 52 y.o.   MRN: [id number]   HPI: Kathy Ewing is a 52 y.o. female presenting for severe nausea and reflux. Vomiting as well. No fever, chills, but is achy. Denies hematemesis. Abd uncomfortable all over.       08/29/2022    9:31 AM 03/22/2022   11:43 AM 03/22/2022   11:32 AM 02/21/2022    1:51 PM 01/20/2022    2:00 PM  Depression screen PHQ 2/9  Decreased Interest 0 1 0 1 0  Down, Depressed, Hopeless 0 0 0 0 0  PHQ - 2 Score 0 1 0 1 0  Altered sleeping  0  0 0  Tired, decreased energy  0  0 1  Change in appetite  0  0 0  Feeling bad or failure about yourself   0  0 0  Trouble concentrating  0  0 0  Moving slowly or fidgety/restless  0  0 0  Suicidal thoughts  0  0 0  PHQ-9 Score  1  1 1   Difficult doing work/chores  Not difficult at all   Not difficult at all     Relevant past medical, surgical, family and social history reviewed and updated as indicated.  Interim medical history since our last visit reviewed. Allergies and medications reviewed and updated.  ROS:  Review of Systems  Constitutional: Negative.   HENT: Negative.    Eyes:  Negative for visual disturbance.  Respiratory:  Negative for shortness of breath.   Cardiovascular:  Negative for chest pain.  Gastrointestinal:  Positive for abdominal distention, abdominal pain, nausea and vomiting. Negative for blood in stool.  Musculoskeletal:  Negative for arthralgias.     Social History   Tobacco Use  Smoking Status Every Day   Current packs/day: 0.50   Average packs/day: 0.5 packs/day for 30.0 years (15.0 ttl pk-yrs)   Types: Cigarettes  Smokeless Tobacco Never       Objective:     Wt Readings from Last 3 Encounters:  09/01/22 184 lb 8 oz (83.7 kg)  08/29/22 185 lb 3.2 oz (84 kg)  03/22/22 175 lb 3.2 oz (79.5 kg)     Exam deferred. Video visit performed.   Assessment & Plan:   1. Gastroesophageal reflux disease with  esophagitis without hemorrhage     Meds ordered this encounter  Medications   ondansetron (ZOFRAN-ODT) 8 MG disintegrating tablet    Sig: Take 1 tablet (8 mg total) by mouth every 6 (six) hours as needed for nausea or vomiting.    Dispense:  20 tablet    Refill:  1   pantoprazole (PROTONIX) 40 MG tablet    Sig: Take 1 tablet (40 mg total) by mouth daily. For stomach    Dispense:  30 tablet    Refill:  11    No orders of the defined types were placed in this encounter.     Diagnoses and all orders for this visit:  Gastroesophageal reflux disease with esophagitis without hemorrhage  Other orders -     ondansetron (ZOFRAN-ODT) 8 MG disintegrating tablet; Take 1 tablet (8 mg total) by mouth every 6 (six) hours as needed for nausea or vomiting. -     pantoprazole (PROTONIX) 40 MG tablet; Take 1 tablet (40 mg total) by mouth daily. For stomach    Virtual Visit  Note  I discussed the limitations, risks, security and privacy concerns of  performing an evaluation and management service by video and the availability of in person appointments. The patient was identified with two identifiers. Pt.expressed understanding and agreed to proceed. Pt. Is at home. Dr. Darlyn Read is in his office.  Follow Up Instructions:   I discussed the assessment and treatment plan with the patient. The patient was provided an opportunity to ask questions and all were answered. The patient agreed with the plan and demonstrated an understanding of the instructions.   The patient was advised to call back or seek an in-person evaluation if the symptoms worsen or if the condition fails to improve as anticipated.   Total minutes contact time: 17   Follow up plan: Return if symptoms worsen or fail to improve.  Mechele Claude, MD [facility]

## 2022-11-28 ENCOUNTER — Ambulatory Visit (INDEPENDENT_AMBULATORY_CARE_PROVIDER_SITE_OTHER): Payer: Managed Care, Other (non HMO) | Admitting: Obstetrics & Gynecology

## 2022-11-28 ENCOUNTER — Encounter: Payer: Self-pay | Admitting: Obstetrics & Gynecology

## 2022-11-28 VITALS — BP 136/89 | HR 86 | Ht 66.0 in | Wt 191.5 lb

## 2022-11-28 DIAGNOSIS — Z1231 Encounter for screening mammogram for malignant neoplasm of breast: Secondary | ICD-10-CM

## 2022-11-28 DIAGNOSIS — N3281 Overactive bladder: Secondary | ICD-10-CM | POA: Diagnosis not present

## 2022-11-28 DIAGNOSIS — N938 Other specified abnormal uterine and vaginal bleeding: Secondary | ICD-10-CM | POA: Diagnosis not present

## 2022-11-28 MED ORDER — SOLIFENACIN SUCCINATE 10 MG PO TABS: 10.0000 mg | ORAL_TABLET | Freq: Every day | ORAL | 11 refills | Status: DC

## 2022-11-28 MED ORDER — MEGESTROL ACETATE 40 MG PO TABS: ORAL_TABLET | ORAL | 11 refills | Status: DC

## 2022-11-28 NOTE — Progress Notes (Signed)
Follow up appointment for response: Megestrol cyclically + vesicare   Chief Complaint  Patient presents with   Follow-up    Still taking Megace; Vesicare is working good!!!     Blood pressure 136/89, pulse 86, height 5\' 6"  (1.676 m), weight 191 lb 8 oz (86.9 kg).  Doing well on the megestrol(she decided to continue that instead of switching to prometrium)_ and  Also improved on vesicare 10 mg at bedtime  Wants to continue both  Will space out megestrol  MEDS ordered this encounter: Meds ordered this encounter  Medications   megestrol (MEGACE) 40 MG tablet    Sig: Take 1 tablet daily x 21 days then off 7 days    Dispense:  21 tablet    Refill:  11   solifenacin (VESICARE) 10 MG tablet    Sig: Take 1 tablet (10 mg total) by mouth daily.    Dispense:  30 tablet    Refill:  11    Orders for this encounter: Orders Placed This Encounter  Procedures   MM 3D SCREENING MAMMOGRAM BILATERAL BREAST    Impression + Management Plan   ICD-10-CM   1. Dys synchronous uterine bleeding: managed well on megestrol cyclically 21 on 7 off  N93.8     2. Screening mammogram for breast cancer  Z12.31 MM 3D SCREENING MAMMOGRAM BILATERAL BREAST    MM 3D SCREENING MAMMOGRAM BILATERAL BREAST    3. OAB (overactive bladder): vesicare 10 qhs doing well  N32.81       Follow Up: Return in about 1 year (around 11/28/2023) for Follow up.     All questions were answered.  Past Medical History:  Diagnosis Date   Anxiety    B12 deficiency    Cholelithiasis    GERD (gastroesophageal reflux disease)    Headache    migraine    High cholesterol    Numbness    Pneumonia    Vitamin D deficiency     Past Surgical History:  Procedure Laterality Date   CESAREAN SECTION     x 2   CHOLECYSTECTOMY N/A 10/02/2014   Procedure: LAPAROSCOPIC CHOLECYSTECTOMY;  Surgeon: Emelia Loron, MD;  Location: [facility] OR;  Service: General;  Laterality: N/A;    OB History     Gravida  4   Para  2   Term   2   Preterm      AB  2   Living  1      SAB  2   IAB      Ectopic      Multiple      Live Births  2           No Known Allergies  Social History   Socioeconomic History   Marital status: Married    Spouse name: Not on file   Number of children: Not on file   Years of education: Not on file   Highest education level: Not on file  Occupational History   Not on file  Tobacco Use   Smoking status: Every Day    Current packs/day: 0.50    Average packs/day: 0.5 packs/day for 30.0 years (15.0 ttl pk-yrs)    Types: Cigarettes   Smokeless tobacco: Never  Vaping Use   Vaping status: Every Day  Substance and Sexual Activity   Alcohol use: No    Alcohol/week: 0.0 standard drinks of alcohol   Drug use: Yes    Comment: CBD   Sexual activity: Yes  Birth control/protection: None  Other Topics Concern   Not on file  Social History Narrative   She works at [employer] as a Associate Professor.   Highest level education:  9th grade   She lives with husband, daughter, and son.    Social Determinants of Health   Financial Resource Strain: Not on file  Food Insecurity: Not on file  Transportation Needs: Not on file  Physical Activity: Not on file  Stress: Not on file  Social Connections: Unknown (05/31/2021)   Received from [facility], [facility]   Social Network    Social Network: Not on file    Family History  Problem Relation Age of Onset   Hypertension Mother    Anxiety disorder Brother        x 2   Migraines Sister    Multiple sclerosis Sister        Deceased, 32   Cerebral palsy Daughter        Deceased, 11.5 years   Depression Daughter

## 2022-11-30 ENCOUNTER — Ambulatory Visit (HOSPITAL_COMMUNITY)
Admission: RE | Admit: 2022-11-30 | Discharge: 2022-11-30 | Disposition: A | Discharge status: Home / Self Care | Payer: Managed Care, Other (non HMO) | Source: Ambulatory Visit | Attending: Obstetrics & Gynecology | Admitting: Obstetrics & Gynecology

## 2022-11-30 DIAGNOSIS — Z1231 Encounter for screening mammogram for malignant neoplasm of breast: Secondary | ICD-10-CM | POA: Insufficient documentation

## 2022-12-02 ENCOUNTER — Other Ambulatory Visit (HOSPITAL_COMMUNITY): Payer: Self-pay | Admitting: Obstetrics & Gynecology

## 2022-12-02 DIAGNOSIS — R928 Other abnormal and inconclusive findings on diagnostic imaging of breast: Secondary | ICD-10-CM

## 2022-12-06 ENCOUNTER — Ambulatory Visit (HOSPITAL_COMMUNITY)
Admission: RE | Admit: 2022-12-06 | Discharge: 2022-12-06 | Disposition: A | Discharge status: Home / Self Care | Payer: Managed Care, Other (non HMO) | Source: Ambulatory Visit | Attending: Obstetrics & Gynecology | Admitting: Obstetrics & Gynecology

## 2022-12-06 DIAGNOSIS — R928 Other abnormal and inconclusive findings on diagnostic imaging of breast: Secondary | ICD-10-CM | POA: Insufficient documentation

## 2023-02-01 ENCOUNTER — Ambulatory Visit: Payer: 59 | Admitting: Family Medicine

## 2023-02-01 ENCOUNTER — Other Ambulatory Visit: Payer: 59

## 2023-02-01 ENCOUNTER — Encounter: Payer: Self-pay | Admitting: Family Medicine

## 2023-02-01 ENCOUNTER — Ambulatory Visit: Payer: Self-pay | Admitting: Family Medicine

## 2023-02-01 VITALS — BP 152/82 | HR 85 | Temp 98.2°F | Ht 66.0 in | Wt 189.8 lb

## 2023-02-01 DIAGNOSIS — I499 Cardiac arrhythmia, unspecified: Secondary | ICD-10-CM | POA: Diagnosis not present

## 2023-02-01 DIAGNOSIS — R002 Palpitations: Secondary | ICD-10-CM

## 2023-02-01 DIAGNOSIS — R55 Syncope and collapse: Secondary | ICD-10-CM

## 2023-02-01 DIAGNOSIS — R42 Dizziness and giddiness: Secondary | ICD-10-CM

## 2023-02-01 NOTE — Progress Notes (Signed)
 Subjective:  Patient ID: Kathy Ewing, female    DOB: 07/15/1970, 53 y.o.   MRN: [id number]  Patient Care Team: Roise Cleaver, MD as PCP - General (Family Medicine) Flavia Hughs, MD (Inactive) as Attending Physician (Cardiology)   Chief Complaint:  Dizziness (X 3 days on and off )   HPI: Kathy Ewing is a 53 y.o. female presenting on 02/01/2023 for Dizziness (X 3 days on and off )   Discussed the use of AI scribe software for clinical note transcription with the patient, who gave verbal consent to proceed.  History of Present Illness   The patient, with a history of migraines and heavy menstrual bleeding, presented with new onset dizziness that began on Sunday night. The dizziness was severe enough to almost cause a fainting episode while the patient was sitting at a table. The patient denied any positional changes or new medications prior to the onset of dizziness. During the episode, the patient experienced generalized weakness and had to lie down. The symptoms resolved after lying down and applying cold compresses. The patient also reported a brief period of blurred vision during the episode. Since the initial episode, the patient has been experiencing intermittent dizziness but not to the same severity.  In addition to dizziness, the patient reported a new onset of right-sided chest pain that felt like a pulled muscle, especially during breathing. The pain started after a phone call earlier in the day and has been since resolved. The patient also reported an itching sensation in the right ear that started last week, but denied any earache-like pain.  The patient also reported feeling extremely tired since the onset of these symptoms. The patient has a history of heavy menstrual bleeding and has experienced similar symptoms during episodes of heavy bleeding in the past. The patient is currently on Megace  for menstrual regulation. The patient denied any sore throat, fever,  or chills but reported feeling cold during the visit and experiencing hot flashes. The patient also reported a family history of multiple sclerosis and stroke.        Relevant past medical, surgical, family, and social history reviewed and updated as indicated.  Allergies and medications reviewed and updated. Data reviewed: Chart in Epic.   Past Medical History:  Diagnosis Date   Anxiety    B12 deficiency    Cholelithiasis    GERD (gastroesophageal reflux disease)    Headache    migraine    High cholesterol    Numbness    Pneumonia    Vitamin D  deficiency     Past Surgical History:  Procedure Laterality Date   CESAREAN SECTION     x 2   CHOLECYSTECTOMY N/A 10/02/2014   Procedure: LAPAROSCOPIC CHOLECYSTECTOMY;  Surgeon: Enid Harry, MD;  Location: [facility] OR;  Service: General;  Laterality: N/A;    Social History   Socioeconomic History   Marital status: Married    Spouse name: Not on file   Number of children: Not on file   Years of education: Not on file   Highest education level: Not on file  Occupational History   Not on file  Tobacco Use   Smoking status: Every Day    Current packs/day: 0.50    Average packs/day: 0.5 packs/day for 30.0 years (15.0 ttl pk-yrs)    Types: Cigarettes   Smokeless tobacco: Never  Vaping Use   Vaping status: Every Day  Substance and Sexual Activity   Alcohol use: No  Alcohol/week: 0.0 standard drinks of alcohol   Drug use: Yes    Comment: CBD   Sexual activity: Yes    Birth control/protection: None  Other Topics Concern   Not on file  Social History Narrative   She works at [employer] as a Associate Professor.   Highest level education:  9th grade   She lives with husband, daughter, and son.    Social Drivers of Corporate investment banker Strain: Not on file  Food Insecurity: Not on file  Transportation Needs: Not on file  Physical Activity: Not on file  Stress: Not on file  Social Connections: Unknown (05/31/2021)    Received from [facility]   Social Network    Social Network: Not on file  Intimate Partner Violence: Unknown (04/22/2021)   Received from [facility], [facility]   HITS    Physically Hurt: Not on file    Insult or Talk Down To: Not on file    Threaten Physical Harm: Not on file    Scream or Curse: Not on file    Outpatient Encounter Medications as of 02/01/2023  Medication Sig   atorvastatin  (LIPITOR) 40 MG tablet Take 1 tablet (40 mg total) by mouth daily.   megestrol  (MEGACE ) 40 MG tablet Take 1 tablet daily x 21 days then off 7 days   ondansetron  (ZOFRAN -ODT) 8 MG disintegrating tablet Take 1 tablet (8 mg total) by mouth every 6 (six) hours as needed for nausea or vomiting.   pantoprazole  (PROTONIX ) 40 MG tablet Take 1 tablet (40 mg total) by mouth daily. For stomach   solifenacin  (VESICARE ) 10 MG tablet Take 1 tablet (10 mg total) by mouth daily.   Vitamin D , Ergocalciferol , (DRISDOL ) 1.25 MG (50000 UNIT) CAPS capsule Take 1 capsule (50,000 Units total) by mouth every 7 (seven) days.   Facility-Administered Encounter Medications as of 02/01/2023  Medication   cyanocobalamin  ((VITAMIN B-12)) injection 1,000 mcg    No Known Allergies  Pertinent ROS per HPI, otherwise unremarkable      Objective:  BP (!) 152/82   Pulse 85   Temp 98.2 F (36.8 C)   Ht 5\' 6"  (1.676 m)   Wt 189 lb 12.8 oz (86.1 kg)   SpO2 97%   BMI 30.63 kg/m    Wt Readings from Last 3 Encounters:  02/01/23 189 lb 12.8 oz (86.1 kg)  11/28/22 191 lb 8 oz (86.9 kg)  09/01/22 184 lb 8 oz (83.7 kg)    Physical Exam Vitals and nursing note reviewed.  Constitutional:      General: She is not in acute distress.    Appearance: Normal appearance. She is obese. She is not ill-appearing, toxic-appearing or diaphoretic.  HENT:     Head: Normocephalic and atraumatic.     Right Ear: There is impacted cerumen.     Left Ear: There is impacted cerumen.     Nose: Nose normal.      Mouth/Throat:     Mouth: Mucous membranes are moist.     Pharynx: Oropharynx is clear.  Eyes:     Extraocular Movements: Extraocular movements intact.     Conjunctiva/sclera: Conjunctivae normal.     Pupils: Pupils are equal, round, and reactive to light.  Neck:     Vascular: No carotid bruit.  Cardiovascular:     Rate and Rhythm: Normal rate and regular rhythm.     Heart sounds: Normal heart sounds.  Pulmonary:     Effort: Pulmonary effort is  normal.     Breath sounds: Normal breath sounds.  Musculoskeletal:     Cervical back: Neck supple.     Right lower leg: No edema.     Left lower leg: No edema.  Skin:    General: Skin is warm and dry.     Capillary Refill: Capillary refill takes less than 2 seconds.  Neurological:     General: No focal deficit present.     Mental Status: She is alert and oriented to person, place, and time.     Cranial Nerves: No cranial nerve deficit.     Sensory: No sensory deficit.     Motor: No weakness.     Coordination: Coordination normal.     Deep Tendon Reflexes: Reflexes normal.  Psychiatric:        Mood and Affect: Mood normal.        Behavior: Behavior normal. Behavior is cooperative.        Thought Content: Thought content normal.        Judgment: Judgment normal.     Results for orders placed or performed in visit on 09/01/22  POCT Urinalysis Dipstick   Collection Time: 09/01/22 12:09 PM  Result Value Ref Range   Color, UA     Clarity, UA     Glucose, UA Negative Negative   Bilirubin, UA     Ketones, UA neg    Spec Grav, UA     Blood, UA trace    pH, UA     Protein, UA Negative Negative   Urobilinogen, UA     Nitrite, UA neg    Leukocytes, UA Negative Negative   Appearance     Odor         Pertinent labs & imaging results that were available during my care of the patient were reviewed by me and considered in my medical decision making.  Assessment & Plan:  Aletse "Estel Heir" was seen today for dizziness.  Diagnoses and  all orders for this visit:  Dizziness -     Anemia Profile B -     CMP14+EGFR -     Thyroid  Panel With TSH -     LONG TERM MONITOR (3-14 DAYS); Future -     CT HEAD WO CONTRAST ( ); Future  Near syncope -     Anemia Profile B -     CMP14+EGFR -     Thyroid  Panel With TSH -     LONG TERM MONITOR (3-14 DAYS); Future -     CT HEAD WO CONTRAST ( ); Future  Palpitations -     Anemia Profile B -     CMP14+EGFR -     Thyroid  Panel With TSH -     LONG TERM MONITOR (3-14 DAYS); Future     Assessment and Plan    Dizziness and Palpitations Intermittent dizziness since Sunday night. No recent medication changes. No associated palpitations, slurred speech, facial droop, or unilateral weakness. Differential diagnosis includes neurological causes, inner ear issues, anemia, electrolyte imbalances, thyroid  dysfunction, and cardiac arrhythmias. Emphasized timely intervention if symptoms persist or worsen due to potential stroke risk. Family history of strokes and MS. - Order Zio heart monitor for 14 days - Order blood work for anemia, electrolyte imbalances (potassium, sodium, calcium ), and thyroid  function - Order CT scan of the head - Advise hospital visit if symptoms persist or worsen - Follow up with Dr. Verlene Glimpse in two weeks   General Health Maintenance On Megace  prescribed by Dr. Bascom Lily.  No recent menstrual cycles, occasional spotting.  Follow-up - Follow up with Dr. Verlene Glimpse in two weeks - Keep phone available for scheduling CT scan.          Continue all other maintenance medications.  Follow up plan: Return in 2 weeks (on 02/15/2023), or if symptoms worsen or fail to improve, for dizziness.   Continue healthy lifestyle choices, including diet (rich in fruits, vegetables, and lean proteins, and low in salt and simple carbohydrates) and exercise (at least 30 minutes of moderate physical activity daily).  Educational handout given for dizziness  The above assessment and  management plan was discussed with the patient. The patient verbalized understanding of and has agreed to the management plan. Patient is aware to call the clinic if they develop any new symptoms or if symptoms persist or worsen. Patient is aware when to return to the clinic for a follow-up visit. Patient educated on when it is appropriate to go to the emergency department.   Kattie Parrot, FNP-C [facility] [phone]

## 2023-02-01 NOTE — Telephone Encounter (Signed)
 Chief Complaint: dizzy/lightheaded, right ear pain Symptoms: lightheaded/feeling faint, right ear itching and painful Frequency: ear symptoms x 1 week; dizziness x 4 days Pertinent Negatives: Patient denies bleeding, nausea, vomiting, diarrhea, changes in vision, changes in speech, SOB, chest pain, fevers Disposition: [] ED /[] Urgent Care (no appt availability in office) / [x] Appointment(In office/virtual)/ []  [facility]/ [] Home Care/ [] Refused Recommended Disposition /[] [facility]/ []  Follow-up with PCP Additional Notes: Patient agreeable to office visit, states she would like to come in tomorrow so that her daughter can drive her. Advised patient it is not recommended she drive herself due to symptoms. Patient states she has not had any syncopal episodes and when she feels faint or lightheaded she lies down and symptoms improve. Patient verbalizes understanding to call back for any worsening or concerning symptoms.  Copied from CRM [id number]. Topic: Clinical - Red Word Triage >> Feb 01, 2023 11:54 AM Crispin Dolphin wrote: Red Word that prompted transfer to Nurse Triage: Back pain and dizziness worse than before. Sometimes can't stand up it hurts so bad. Mostly in mornings. Reason for Disposition  [1] MODERATE dizziness (e.g., interferes with normal activities) AND [2] has NOT been evaluated by doctor (or NP/PA) for this  (Exception: Dizziness caused by heat exposure, sudden standing, or poor fluid intake.)  Answer Assessment - Initial Assessment Questions 1. DESCRIPTION: "Describe your dizziness."     Patient states the other night she felt faint like she would pass out, more of a lightheaded feeling when getting up and moving around.  2. LIGHTHEADED: "Do you feel lightheaded?" (e.g., somewhat faint, woozy, weak upon standing)     Lightheaded and weakness.  3. VERTIGO: "Do you feel like either you or the room is spinning or tilting?" (i.e. vertigo)     Patient states that  the other night when she felt faint after she lied down on the floor and it felt like vertigo.  4. SEVERITY: "How bad is it?"  "Do you feel like you are going to faint?" "Can you stand and walk?"   - MILD: Feels slightly dizzy, but walking normally.   - MODERATE: Feels unsteady when walking, but not falling; interferes with normal activities (e.g., school, work).   - SEVERE: Unable to walk without falling, or requires assistance to walk without falling; feels like passing out now.      Mild at this time.  5. ONSET:  "When did the dizziness begin?"     Since Sunday night; constant but not as severe as Sunday.  6. AGGRAVATING FACTORS: "Does anything make it worse?" (e.g., standing, change in head position)     Movement makes it worse; helps to lie down and put a cool rag on her face.  7. HEART RATE: "Can you tell me your heart rate?" "How many beats in 15 seconds?"  (Note: not all patients can do this)       Patient hasn't checked.  8. CAUSE: "What do you think is causing the dizziness?"     Unsure.  9. RECURRENT SYMPTOM: "Have you had dizziness before?" If Yes, ask: "When was the last time?" "What happened that time?"     She states she felt like this before last year when she was having vaginal bleeding, states she had to be placed on medication. Denies any vaginal bleeding now.  10. OTHER SYMPTOMS: "Do you have any other symptoms?" (e.g., fever, chest pain, vomiting, diarrhea, bleeding)       Right ear pain and itching, chronic back  pain worsening the "last few months", "every once in a while" yes to chest pain and SOB states last episode was about a month ago and states she has felt that since having her gallbladder out. Patient denies any recent or current SOB or chest pain.  Protocols used: Dizziness - Lightheadedness-A-AH

## 2023-02-02 ENCOUNTER — Encounter: Payer: Self-pay | Admitting: Family Medicine

## 2023-02-02 ENCOUNTER — Ambulatory Visit: Payer: 59

## 2023-02-02 LAB — ANEMIA PROFILE B
Basophils Absolute: 0.1 10*3/uL (ref 0.0–0.2)
Basos: 1 %
EOS (ABSOLUTE): 0.2 10*3/uL (ref 0.0–0.4)
Eos: 3 %
Ferritin: 43 ng/mL (ref 15–150)
Folate: 6.4 ng/mL (ref >3.0)
Hematocrit: 47 % — ABNORMAL HIGH (ref 34.0–46.6)
Hemoglobin: 15.9 g/dL (ref 11.1–15.9)
Immature Grans (Abs): 0 10*3/uL (ref 0.0–0.1)
Immature Granulocytes: 0 %
Iron Saturation: 40 % (ref 15–55)
Iron: 127 ug/dL (ref 27–159)
Lymphocytes Absolute: 2 10*3/uL (ref 0.7–3.1)
Lymphs: 24 %
MCH: 30.2 pg (ref 26.6–33.0)
MCHC: 33.8 g/dL (ref 31.5–35.7)
MCV: 89 fL (ref 79–97)
Monocytes Absolute: 0.7 10*3/uL (ref 0.1–0.9)
Monocytes: 9 %
Neutrophils Absolute: 5.1 10*3/uL (ref 1.4–7.0)
Neutrophils: 63 %
Platelets: 346 10*3/uL (ref 150–450)
RBC: 5.27 x10E6/uL (ref 3.77–5.28)
RDW: 13.1 % (ref 11.7–15.4)
Retic Ct Pct: 1.4 % (ref 0.6–2.6)
Total Iron Binding Capacity: 315 ug/dL (ref 250–450)
UIBC: 188 ug/dL (ref 131–425)
Vitamin B-12: 342 pg/mL (ref 232–1245)
WBC: 8.1 10*3/uL (ref 3.4–10.8)

## 2023-02-02 LAB — THYROID PANEL WITH TSH
Free Thyroxine Index: 2.6 (ref 1.2–4.9)
T3 Uptake Ratio: 25 % (ref 24–39)
T4, Total: 10.2 ug/dL (ref 4.5–12.0)
TSH: 3.2 u[IU]/mL (ref 0.450–4.500)

## 2023-02-02 LAB — CMP14+EGFR
ALT: 11 IU/L (ref 0–32)
AST: 15 [IU]/L (ref 0–40)
Albumin: 4.5 g/dL (ref 3.9–4.9)
Alkaline Phosphatase: 125 IU/L — ABNORMAL HIGH (ref 44–121)
BUN/Creatinine Ratio: 7 — ABNORMAL LOW (ref 9–23)
BUN: 7 mg/dL (ref 6–24)
Bilirubin Total: 0.8 mg/dL (ref 0.0–1.2)
CO2: 20 mmol/L (ref 20–29)
Calcium: 9.3 mg/dL (ref 8.7–10.2)
Chloride: 105 mmol/L (ref 96–106)
Creatinine, Ser: 0.96 mg/dL (ref 0.57–1.00)
Globulin, Total: 2.4 g/dL (ref 1.5–4.5)
Glucose: 76 mg/dL (ref 70–99)
Potassium: 3.9 mmol/L (ref 3.5–5.2)
Sodium: 140 mmol/L (ref 134–144)
Total Protein: 6.9 g/dL (ref 6.0–8.5)
eGFR: 72 mL/min/{1.73_m2} (ref >59)

## 2023-02-07 ENCOUNTER — Ambulatory Visit (HOSPITAL_COMMUNITY)
Admission: RE | Admit: 2023-02-07 | Discharge: 2023-02-07 | Disposition: A | Discharge status: Home / Self Care | Payer: Managed Care, Other (non HMO) | Source: Ambulatory Visit | Attending: Family Medicine | Admitting: Family Medicine

## 2023-02-07 ENCOUNTER — Encounter: Payer: Self-pay | Admitting: Family Medicine

## 2023-02-07 DIAGNOSIS — R55 Syncope and collapse: Secondary | ICD-10-CM | POA: Diagnosis present

## 2023-02-07 DIAGNOSIS — R42 Dizziness and giddiness: Secondary | ICD-10-CM | POA: Insufficient documentation

## 2023-02-20 ENCOUNTER — Ambulatory Visit: Payer: 59 | Admitting: Family Medicine

## 2023-02-22 ENCOUNTER — Ambulatory Visit: Payer: 59 | Admitting: Family Medicine

## 2023-02-22 NOTE — Addendum Note (Signed)
Addended by: Sonny Masters on: 02/22/2023 01:26 PM   Modules accepted: Orders

## 2023-03-09 ENCOUNTER — Ambulatory Visit: Payer: Managed Care, Other (non HMO) | Attending: Nurse Practitioner | Admitting: Nurse Practitioner

## 2023-03-09 ENCOUNTER — Telehealth: Payer: Self-pay | Admitting: Nurse Practitioner

## 2023-03-09 ENCOUNTER — Encounter: Payer: Self-pay | Admitting: Nurse Practitioner

## 2023-03-09 VITALS — BP 130/80 | HR 83 | Ht 66.0 in | Wt 184.7 lb

## 2023-03-09 DIAGNOSIS — R55 Syncope and collapse: Secondary | ICD-10-CM

## 2023-03-09 DIAGNOSIS — R42 Dizziness and giddiness: Secondary | ICD-10-CM | POA: Diagnosis not present

## 2023-03-09 DIAGNOSIS — R0789 Other chest pain: Secondary | ICD-10-CM | POA: Diagnosis not present

## 2023-03-09 DIAGNOSIS — R030 Elevated blood-pressure reading, without diagnosis of hypertension: Secondary | ICD-10-CM

## 2023-03-09 DIAGNOSIS — R5383 Other fatigue: Secondary | ICD-10-CM

## 2023-03-09 NOTE — Progress Notes (Addendum)
 Cardiology Office Note:  .   Date:  03/09/2023  ID:  Kathy Ewing, DOB 1970/03/11, MRN [id number] PCP: Mechele Claude, MD  [facility] Providers Cardiologist:  Charlton Haws, MD    History of Present Illness: [name] Kitchen   Kathy Ewing is a 53 y.o. female with a past medical history of chest pain, female, tobacco abuse, migraines, GERD, history of back pain, history of heavy menstrual bleeding, who presents today for lightheadedness, near syncope, fatigue, and atypical chest pain evaluation.  Last seen by Dr. Cyndie Chime on May 03, 2021.  Patient noted chest pain, noted to be atypical..  Cardiac CTA was arranged that revealed coronary calcium score of 0 with no evidence of CAD.  Saw her PCP on February 01, 2023 and reported dizziness, enough to cause near syncopal episode.  Denied any positional changes.  Prior to the episode she experienced generalized weakness and had to lie down.  Had brief period of blurred vision during the episode.  Also reported new onset right sided chest pain, felt that she pulled a muscle.  Reported feeling extremely tired since onset of the symptoms.  She had a thorough workup done by her PCP that was overall benign -see monitor report noted below.  Referral was placed to outpatient cardiology evaluation.  Monitor worn earlier this month revealed normal sinus rhythm, average heart rate 79 bpm.  Overall benign monitor.  Today she presents for follow-up regarding the symptoms.  She states she continues to note intermittent lightheadedness but not as severe as her previous episode when she had a lay down at home.  Says this is a daily occurrence, denies any orthostatic symptoms.  Admits to some intermittent chest pain at night that last for few minutes, then goes away.  Difficult for her to describe.  Admits to feeling tired and husband says that he notices she gets short of breath with exertion, patient not sure and believes this is due to believes may be due to the cold  weather.  Has episodes where she says her face "feels like it is on fire."  Has been have been moments where she notices her blood pressure is elevated. Denies any palpitations, syncope, presyncope, orthopnea, PND, swelling or significant weight changes, acute bleeding, or claudication.  ROS: Negative. See HPI.   Studies Reviewed: [name] Kitchen    EKG:  EKG Interpretation Date/Time:  Thursday March 09 2023 10:26:28 EST Ventricular Rate:  84 PR Interval:  112 QRS Duration:  82 QT Interval:  396 QTC Calculation: 467 R Axis:   44  Text Interpretation: Normal sinus rhythm Normal ECG When compared with ECG of 06-May-2009 16:06, No significant change was found Confirmed by Sharlene Dory [id number]) on 03/09/2023 10:53:25 AM   Cardiac monitor 02/2023:  Tachy-brady present. Avoid stimulants. Follow up with cardiology.    Patch Wear Time:  13 days and 19 hours (2025-01-15T15:19:53-0500 to 2025-01-29T10:49:55-0500)   Patient had a min HR of 46 bpm, max HR of 170 bpm, and avg HR of 79 bpm. Predominant underlying rhythm was Sinus Rhythm. 1 run of Supraventricular Tachycardia occurred lasting 4 beats with a max rate of 152 bpm (avg 140 bpm). Isolated SVEs were rare  (<1.0%), SVE Couplets were rare (<1.0%), and SVE Triplets were rare (<1.0%). Isolated VEs were rare (<1.0%), and no VE Couplets or VE Triplets were present.   CCTA 04/2021:  IMPRESSION: 1. Coronary calcium score of 0. This was 0 percentile for age-, sex, and race-matched controls.   2. Normal coronary origin  with right dominance.   3. No evidence of CAD.   RECOMMENDATIONS: CAD-RADS 0: No evidence of CAD (0%). Consider non-atherosclerotic causes of chest pain.   [name], MD  Echo 11/2013:  Study Conclusions   - Left ventricle: The cavity size was normal. Wall thickness was    normal. Systolic function was normal. The estimated ejection    fraction was in the range of 60% to 65%. Wall motion was normal;    there were no regional  wall motion abnormalities. Indeterminant    diastolic function.  - Aortic valve: Valve area (VTI): 2.29 cm^2. Valve area (Vmax):    2.29 cm^2. Valve area (Vmean): 2.3 cm^2.  - Technically adequate study.  Physical Exam:   VS:  BP 130/80   Pulse 83   Ht 5\' 6"  (1.676 m)   Wt 184 lb 11.2 oz (83.8 kg)   SpO2 98%   BMI 29.81 kg/m    Wt Readings from Last 3 Encounters:  03/09/23 184 lb 11.2 oz (83.8 kg)  02/01/23 189 lb 12.8 oz (86.1 kg)  11/28/22 191 lb 8 oz (86.9 kg)    Orthostatic Vital Signs:  Lying: 143/94, 64 bpm Sitting: 139/94, 75 bpm (dizzy) Standing: 142/93, 76 bpm Standing x 3 minutes: 149/98, 88 bpm  GEN: Well nourished, well developed in no acute distress NECK: No JVD; No carotid bruits CARDIAC: S1/S2, RRR, no murmurs, rubs, gallops RESPIRATORY:  Clear to auscultation without rales, wheezing or rhonchi  ABDOMEN: Soft, non-tender, non-distended EXTREMITIES:  No edema; No deformity   ASSESSMENT AND PLAN: .    Atypical chest pain Difficult for patient to describe today and sounds atypical.  EKG reassuring today. CCTA in 2023 was negative for CAD.  No indication for ischemic evaluation at this time.  Will continue to monitor. Recent labs are stable. Heart healthy diet encouraged. Care and ED precautions discussed.   Elevated blood pressure readings BP borderline elevated today. SBP goal < 130 and hesitant at this time to start BP medication with her current intermittent symptoms. Discussed to monitor BP at home at least 2 hours after medications and sitting for 5-10 minutes.  No medication changes at this time.  Recent lab work WNL. Continue to follow with PCP.  Lightheadedness, near syncope, fatigue Etiology unclear. Recent monitor revealed overall well controlled HR, some elevated HR, with one episode of bradycardia that were not triggered by patient. See cardiac monitor report above. Hesitant to start medication at this time related to her symptoms. EKG reassuring  today. Orthostatics negative today - see above. Will arrange 2D Echo and carotid dopplers for further evaluation. Recent labs WNL. Does not appear related to her blood sugar. Discussed conservative measures and care and ED precautions discussed.     Dispo: Follow-up with me/APP in 6-8 weeks or sooner if anything changes.   Signed, Sharlene Dory, NP

## 2023-03-09 NOTE — Patient Instructions (Signed)
 Medication Instructions:  Your physician recommends that you continue on your current medications as directed. Please refer to the Current Medication list given to you today.  Labwork: None   Testing/Procedures: Your physician has requested that you have an echocardiogram. Echocardiography is a painless test that uses sound waves to create images of your heart. It provides your doctor with information about the size and shape of your heart and how well your heart's chambers and valves are working. This procedure takes approximately one hour. There are no restrictions for this procedure. Please do NOT wear cologne, perfume, aftershave, or lotions (deodorant is allowed). Please arrive 15 minutes prior to your appointment time.  Please note: We ask at that you not bring children with you during ultrasound (echo/ vascular) testing. Due to room size and safety concerns, children are not allowed in the ultrasound rooms during exams. Our front office staff cannot provide observation of children in our lobby area while testing is being conducted. An adult accompanying a patient to their appointment will only be allowed in the ultrasound room at the discretion of the ultrasound technician under special circumstances. We apologize for any inconvenience. Your physician has requested that you have a carotid duplex. This test is an ultrasound of the carotid arteries in your neck. It looks at blood flow through these arteries that supply the brain with blood. Allow one hour for this exam. There are no restrictions or special instructions.  Follow-Up: Your physician recommends that you schedule a follow-up appointment in: 6-8 weeks   Any Other Special Instructions Will Be Listed Below (If Applicable).  If you need a refill on your cardiac medications before your next appointment, please call your pharmacy.

## 2023-03-09 NOTE — Telephone Encounter (Signed)
 Checking percert on the following    ECHO-CARTOID    03/23/2023 [facility] oFFICE)

## 2023-03-13 ENCOUNTER — Encounter: Payer: Self-pay | Admitting: Family Medicine

## 2023-03-13 ENCOUNTER — Ambulatory Visit (INDEPENDENT_AMBULATORY_CARE_PROVIDER_SITE_OTHER): Payer: Managed Care, Other (non HMO) | Admitting: Family Medicine

## 2023-03-13 VITALS — BP 137/81 | HR 80 | Temp 98.0°F | Ht 66.0 in | Wt 199.0 lb

## 2023-03-13 DIAGNOSIS — R55 Syncope and collapse: Secondary | ICD-10-CM

## 2023-03-13 DIAGNOSIS — F419 Anxiety disorder, unspecified: Secondary | ICD-10-CM | POA: Diagnosis not present

## 2023-03-13 DIAGNOSIS — R42 Dizziness and giddiness: Secondary | ICD-10-CM | POA: Diagnosis not present

## 2023-03-13 DIAGNOSIS — R002 Palpitations: Secondary | ICD-10-CM | POA: Diagnosis not present

## 2023-03-13 MED ORDER — QUETIAPINE FUMARATE 25 MG PO TABS: 25.0000 mg | ORAL_TABLET | Freq: Every day | ORAL | 1 refills | Status: DC

## 2023-03-13 NOTE — Progress Notes (Unsigned)
 Subjective:  Patient ID: Kathy Ewing, female    DOB: [birth year]  Age: 53 y.o. MRN: [id number]  CC: Dizziness (Still having occasional dizziness. Kathy Ewing she needs her thyroid and hormones checked. Kathy Ewing and orthostatics good. )   HPI Kathy Ewing presents for cardilogy working up dizziness, palpitations. Dizziness is like everything going black, like in a tunnel. Couldn't breath good & heart was racing. Gets very anxious during spells. Feels veryt tired after.     08/29/2022    9:31 AM 03/22/2022   11:43 AM 03/22/2022   11:32 AM  Depression screen PHQ 2/9  Decreased Interest 0 1 0  Down, Depressed, Hopeless 0 0 0  PHQ - 2 Score 0 1 0  Altered sleeping  0   Tired, decreased energy  0   Change in appetite  0   Feeling bad or failure about yourself   0   Trouble concentrating  0   Moving slowly or fidgety/restless  0   Suicidal thoughts  0   PHQ-9 Score  1   Difficult doing work/chores  Not difficult at all     History Kathy Ewing has a past medical history of Anxiety, B12 deficiency, Cholelithiasis, GERD (gastroesophageal reflux disease), Headache, High cholesterol, Numbness, Pneumonia, and Vitamin D deficiency.   She has a past surgical history that includes Cesarean section and Cholecystectomy (N/A, 10/02/2014).   Her family history includes Anxiety disorder in her brother; Cerebral palsy in her daughter; Depression in her daughter; Hypertension in her mother; Migraines in her sister; Multiple sclerosis in her sister.She reports that she has been smoking cigarettes. She has a 15 pack-year smoking history. She has never used smokeless tobacco. She reports current drug use. She reports that she does not drink alcohol.    ROS Review of Systems  Objective:  BP 137/81   Pulse 80   Temp 98 F (36.7 C)   Ht 5\' 6"  (1.676 m)   Wt 199 lb (90.3 kg)   SpO2 100%   BMI 32.12 kg/m   BP Readings from Last 3 Encounters:  03/13/23 137/81  03/09/23 130/80  02/01/23 (!) 152/82    Wt  Readings from Last 3 Encounters:  03/13/23 199 lb (90.3 kg)  03/09/23 184 lb 11.2 oz (83.8 kg)  02/01/23 189 lb 12.8 oz (86.1 kg)     Physical Exam    Assessment & Plan:   There are no diagnoses linked to this encounter.     I am having Kathy Ewing "Kathy Ewing" maintain her atorvastatin, Vitamin D (Ergocalciferol), ondansetron, pantoprazole, megestrol, and solifenacin. We will continue to administer cyanocobalamin.  Allergies as of 03/13/2023   No Known Allergies      Medication List        Accurate as of March 13, 2023  3:17 PM. If you have any questions, ask your nurse or doctor.          atorvastatin 40 MG tablet Commonly known as: LIPITOR Take 1 tablet (40 mg total) by mouth daily.   megestrol 40 MG tablet Commonly known as: MEGACE Take 1 tablet daily x 21 days then off 7 days   ondansetron 8 MG disintegrating tablet Commonly known as: ZOFRAN-ODT Take 1 tablet (8 mg total) by mouth every 6 (six) hours as needed for nausea or vomiting.   pantoprazole 40 MG tablet Commonly known as: PROTONIX Take 1 tablet (40 mg total) by mouth daily. For stomach   solifenacin 10 MG tablet Commonly known as: VESIcare Take 1  tablet (10 mg total) by mouth daily.   Vitamin D (Ergocalciferol) 1.25 MG (50000 UNIT) Caps capsule Commonly known as: DRISDOL Take 1 capsule (50,000 Units total) by mouth every 7 (seven) days.         Follow-up: No follow-ups on file.  Mechele Claude, M.D.

## 2023-03-23 ENCOUNTER — Ambulatory Visit: Payer: Managed Care, Other (non HMO)

## 2023-03-23 ENCOUNTER — Other Ambulatory Visit: Payer: Self-pay | Admitting: Nurse Practitioner

## 2023-03-23 ENCOUNTER — Ambulatory Visit: Payer: Managed Care, Other (non HMO) | Attending: Nurse Practitioner

## 2023-03-23 DIAGNOSIS — R0789 Other chest pain: Secondary | ICD-10-CM

## 2023-03-23 DIAGNOSIS — R5383 Other fatigue: Secondary | ICD-10-CM

## 2023-03-23 DIAGNOSIS — R42 Dizziness and giddiness: Secondary | ICD-10-CM | POA: Diagnosis not present

## 2023-03-23 DIAGNOSIS — R079 Chest pain, unspecified: Secondary | ICD-10-CM | POA: Diagnosis not present

## 2023-03-23 DIAGNOSIS — R55 Syncope and collapse: Secondary | ICD-10-CM

## 2023-03-23 LAB — ECHOCARDIOGRAM COMPLETE
AR max vel: 2.53 cm2
AV Area VTI: 2.51 cm2
AV Area mean vel: 2.16 cm2
AV Mean grad: 3 mmHg
AV Peak grad: 5.7 mmHg
Ao pk vel: 1.19 m/s
Area-P 1/2: 4.19 cm2
Calc EF: 61 %
MV VTI: 2.12 cm2
S' Lateral: 3.2 cm
Single Plane A2C EF: 60.3 %
Single Plane A4C EF: 61.3 %

## 2023-03-27 ENCOUNTER — Ambulatory Visit: Payer: Managed Care, Other (non HMO) | Admitting: Family Medicine

## 2023-03-27 ENCOUNTER — Encounter: Payer: Self-pay | Admitting: Family Medicine

## 2023-04-20 ENCOUNTER — Ambulatory Visit: Payer: Managed Care, Other (non HMO) | Attending: Nurse Practitioner | Admitting: Nurse Practitioner

## 2023-04-20 ENCOUNTER — Encounter: Payer: Self-pay | Admitting: Nurse Practitioner

## 2023-04-20 VITALS — BP 132/63 | HR 66 | Ht 66.0 in | Wt 193.0 lb

## 2023-04-20 DIAGNOSIS — R5383 Other fatigue: Secondary | ICD-10-CM | POA: Diagnosis not present

## 2023-04-20 DIAGNOSIS — R42 Dizziness and giddiness: Secondary | ICD-10-CM | POA: Diagnosis not present

## 2023-04-20 DIAGNOSIS — R002 Palpitations: Secondary | ICD-10-CM | POA: Diagnosis not present

## 2023-04-20 DIAGNOSIS — R55 Syncope and collapse: Secondary | ICD-10-CM | POA: Diagnosis not present

## 2023-04-20 DIAGNOSIS — I10 Essential (primary) hypertension: Secondary | ICD-10-CM

## 2023-04-20 NOTE — Patient Instructions (Signed)

## 2023-04-20 NOTE — Progress Notes (Signed)
 Cardiology Office Note:  .   Date:  04/20/2023  ID:  Kathy Ewing, DOB 06/26/1970, MRN [id number] PCP: Kathy Claude, MD  [facility] Providers Cardiologist:  Kathy Haws, MD    History of Present Illness: [name] Kitchen   Kathy Ewing is a 53 y.o. female with a past medical history of chest pain, female, tobacco abuse, migraines, GERD, history of back pain, history of heavy menstrual bleeding, who presents today for scheduled follow-up.   Last seen by Kathy Ewing on May 03, 2021.  Patient noted chest pain, noted to be atypical..  Cardiac CTA was arranged that revealed coronary calcium score of 0 with no evidence of CAD.  Saw her PCP on February 01, 2023 and reported dizziness, enough to cause near syncopal episode.  Denied any positional changes.  Prior to the episode she experienced generalized weakness and had to lie down.  Had brief period of blurred vision during the episode.  Also reported new onset right sided chest pain, felt that she pulled a muscle.  Reported feeling extremely tired since onset of the symptoms.  She had a thorough workup done by her PCP that was overall benign -see monitor report noted below.  Referral was placed to outpatient cardiology evaluation.  Monitor worn earlier this month revealed normal sinus rhythm, average heart rate 79 bpm.  Overall benign monitor.  03/09/2023 - Today she presents for follow-up regarding the symptoms.  She states she continues to note intermittent lightheadedness but not as severe as her previous episode when she had a lay down at home.  Says this is a daily occurrence, denies any orthostatic symptoms.  Admits to some intermittent chest pain at night that last for few minutes, then goes away.  Difficult for her to describe.  Admits to feeling tired and husband says that he notices she gets short of breath with exertion, patient not sure and believes this is due to believes may be due to the cold weather.  Has episodes where she says her face  "feels like it is on fire."  Has been have been moments where she notices her blood pressure is elevated. Denies any palpitations, syncope, presyncope, orthopnea, PND, swelling or significant weight changes, acute bleeding, or claudication.  04/20/2023 -presents today for follow-up.  She tells me she has had 1 more recurrent episode of lightheadedness since I last saw her.  She said when she started having her symptoms, she ate some chocolate and had a Kathy Ewing, felt weak and exhausted afterwards but symptoms did improve after eating chocolate and Kathy Ewing.  She says her episodes have slowed down over time.  She wonders if this is possibly related to low blood sugar.  Tells me she does not eat breakfast every day.  She says that these episodes, specifically the most recent ones, have happened at nighttime around 9 PM. Denies any chest pain, shortness of breath, syncope, presyncope, dizziness, orthopnea, PND, swelling or significant weight changes, acute bleeding, or claudication.  Does admit to some palpitations, hard for her to tell.  ROS: Negative. See HPI.   Studies Reviewed: [name] Kitchen    EKG: EKG is not ordered today.  Carotid duplex 03/2023: Summary:  Right Carotid: Velocities in the right ICA are consistent with a 1-39%  stenosis. Non-hemodynamically significant plaque <50% noted in the  CCA. The ECA appears <50% stenosed.   Left Carotid: Velocities in the left ICA are consistent with a 1-39%  stenosis. Non-hemodynamically significant plaque <50% noted in the  CCA. The ECA appears <50% stenosed.   Vertebrals:  Bilateral vertebral arteries demonstrate antegrade flow.  Subclavians: Normal flow hemodynamics were seen in bilateral subclavian arteries.  Echo 03/2023: 1. Left ventricular ejection fraction, by estimation, is 60 to 65%. The  left ventricle has normal function. The left ventricle has no regional  wall motion abnormalities. Left ventricular diastolic parameters were  normal. The  average left ventricular  global longitudinal strain is -17.8 %. The global longitudinal strain is  normal.   2. Right ventricular systolic function is normal. The right ventricular  size is normal.   3. The mitral valve is normal in structure. No evidence of mitral valve  regurgitation. No evidence of mitral stenosis.   4. The aortic valve is tricuspid. Aortic valve regurgitation is not  visualized. No aortic stenosis is present.   5. The inferior vena cava is normal in size with greater than 50%  respiratory variability, suggesting right atrial pressure of 3 mmHg.   Comparison(s): A prior study was performed on 11/28/2013. EF 60-65%.  Cardiac monitor 02/2023:  Tachy-brady present. Avoid stimulants. Follow up with cardiology.    Patch Wear Time:  13 days and 19 hours (2025-01-15T15:19:53-0500 to 2025-01-29T10:49:55-0500)   Patient had a min HR of 46 bpm, max HR of 170 bpm, and avg HR of 79 bpm. Predominant underlying rhythm was Sinus Rhythm. 1 run of Supraventricular Tachycardia occurred lasting 4 beats with a max rate of 152 bpm (avg 140 bpm). Isolated SVEs were rare  (<1.0%), SVE Couplets were rare (<1.0%), and SVE Triplets were rare (<1.0%). Isolated VEs were rare (<1.0%), and no VE Couplets or VE Triplets were present.   CCTA 04/2021:  IMPRESSION: 1. Coronary calcium score of 0. This was 0 percentile for age-, sex, and race-matched controls.   2. Normal coronary origin with right dominance.   3. No evidence of CAD.   RECOMMENDATIONS: CAD-RADS 0: No evidence of CAD (0%). Consider non-atherosclerotic causes of chest pain.   Kathy Millers, MD  Echo 11/2013:  Study Conclusions   - Left ventricle: The cavity size was normal. Wall thickness was    normal. Systolic function was normal. The estimated ejection    fraction was in the range of 60% to 65%. Wall motion was normal;    there were no regional wall motion abnormalities. Indeterminant    diastolic function.  - Aortic  valve: Valve area (VTI): 2.29 cm^2. Valve area (Vmax):    2.29 cm^2. Valve area (Vmean): 2.3 cm^2.  - Technically adequate study.  Physical Exam:   VS:  BP 132/63 (BP Location: Left Arm, Patient Position: Sitting, Cuff Size: Normal)   Pulse 66   Ht 5\' 6"  (1.676 m)   Wt 193 lb (87.5 kg)   SpO2 98%   BMI 31.15 kg/m    Wt Readings from Last 3 Encounters:  04/20/23 193 lb (87.5 kg)  03/13/23 199 lb (90.3 kg)  03/09/23 184 lb 11.2 oz (83.8 kg)    GEN: Well nourished, well developed in no acute distress NECK: No JVD; No carotid bruits CARDIAC: S1/S2, RRR, no murmurs, rubs, gallops RESPIRATORY:  Clear to auscultation without rales, wheezing or rhonchi  ABDOMEN: Soft, non-tender, non-distended EXTREMITIES:  No edema; No deformity   ASSESSMENT AND PLAN: .    Lightheadedness, palpitations, near syncope, fatigue Etiology unclear. Recent monitor revealed overall well controlled HR, some elevated HR at times, difficult to tell if she has some sort of autonomic dysfunction, one episode of bradycardia that were not  triggered by patient. See cardiac monitor report above.  Does admit to some palpitations at times.  Did discuss starting medication, however patient expressed some hesitancy.  Hesitant to start medication at this time related to her symptoms.. Past Orthostatics negative.  Recent echo and carotid duplex do not explain her symptoms, past CCTA was negative for CAD.  Possibly felt to be due to hormonal component/possible low blood sugar.  She will follow-up with her PCP and OB/GYN for further evaluation.  Recommended to monitor and log her heart rate/blood pressure at home and bring this in to review at next office visit.  Discussed conservative measures and care and ED precautions discussed.   HTN Continues to note elevated BPs during times of episodes.  BP on arrival 140/98, repeat BP 132/63.  Did discuss that SBP goal < 130 and hesitant at this time to start BP medication with her current  intermittent symptoms. Discussed to monitor BP at home at least 2 hours after medications and sitting for 5-10 minutes.  No medication changes at this time. Continue to follow with PCP.    Dispo: Follow-up with me/APP in 2-3 months or sooner if anything changes.   Signed, Sharlene Dory, NP

## 2023-04-26 ENCOUNTER — Ambulatory Visit: Payer: Managed Care, Other (non HMO) | Admitting: Internal Medicine

## 2023-07-28 ENCOUNTER — Ambulatory Visit: Admitting: Nurse Practitioner

## 2023-08-08 ENCOUNTER — Other Ambulatory Visit: Payer: Self-pay | Admitting: Family Medicine

## 2023-09-26 ENCOUNTER — Other Ambulatory Visit: Payer: Self-pay | Admitting: Family Medicine

## 2023-10-22 ENCOUNTER — Other Ambulatory Visit: Payer: Self-pay | Admitting: Family Medicine

## 2023-10-23 NOTE — Telephone Encounter (Signed)
 Stacks pt NTBS 30-d given 09/26/23

## 2023-10-23 NOTE — Telephone Encounter (Signed)
 Appt scheduled for 10/30/2023

## 2023-10-26 ENCOUNTER — Other Ambulatory Visit: Payer: Self-pay | Admitting: Family Medicine

## 2023-10-29 ENCOUNTER — Other Ambulatory Visit: Payer: Self-pay | Admitting: Family Medicine

## 2023-10-30 ENCOUNTER — Ambulatory Visit: Admitting: Family Medicine

## 2023-10-30 NOTE — Telephone Encounter (Signed)
 Last OV 03/13/23. Last RF 08/08/23. Next OV 11/01/23 Vit D 06/29/22 30.9

## 2023-11-01 ENCOUNTER — Encounter: Payer: Self-pay | Admitting: Family Medicine

## 2023-11-01 ENCOUNTER — Ambulatory Visit (INDEPENDENT_AMBULATORY_CARE_PROVIDER_SITE_OTHER): Admitting: Family Medicine

## 2023-11-01 VITALS — BP 132/84 | HR 81 | Temp 98.5°F | Ht 66.0 in | Wt 178.4 lb

## 2023-11-01 DIAGNOSIS — Z720 Tobacco use: Secondary | ICD-10-CM

## 2023-11-01 DIAGNOSIS — K2100 Gastro-esophageal reflux disease with esophagitis, without bleeding: Secondary | ICD-10-CM

## 2023-11-01 DIAGNOSIS — H6121 Impacted cerumen, right ear: Secondary | ICD-10-CM

## 2023-11-01 DIAGNOSIS — E782 Mixed hyperlipidemia: Secondary | ICD-10-CM

## 2023-11-01 DIAGNOSIS — G43709 Chronic migraine without aura, not intractable, without status migrainosus: Secondary | ICD-10-CM

## 2023-11-01 DIAGNOSIS — F419 Anxiety disorder, unspecified: Secondary | ICD-10-CM | POA: Diagnosis not present

## 2023-11-01 DIAGNOSIS — E538 Deficiency of other specified B group vitamins: Secondary | ICD-10-CM

## 2023-11-01 DIAGNOSIS — E559 Vitamin D deficiency, unspecified: Secondary | ICD-10-CM

## 2023-11-01 MED ORDER — AMOXICILLIN 500 MG PO CAPS: 500.0000 mg | ORAL_CAPSULE | Freq: Three times a day (TID) | ORAL | 0 refills | Status: DC

## 2023-11-01 NOTE — Progress Notes (Signed)
 Subjective:  Patient ID: Kathy Ewing, female    DOB: 05/29/1970  Age: 53 y.o. MRN: [id number]  CC: Medical Management of Chronic Issues (Medication refills/Right ear pain)   HPI  Discussed the use of AI scribe software for clinical note transcription with the patient, who gave verbal consent to proceed.  History of Present Illness Kathy Ewing is a 53 year old female who presents for a routine checkup.  She has been experiencing intermittent ear pain over the past week.  She suffers from chronic back pain, which is constant. Due to financial constraints, she has not consulted a back specialist recently and relies on a TENS machine at home for pain relief.  She manages irregular bleeding with Mannose, following a regimen of 21 days on and 7 days off. She recalls a previous episode of prolonged bleeding lasting two to three months, which was challenging. Currently, the bleeding is under control, though some bleeding occurs during the medication-free week. A follow-up is scheduled for November to monitor her condition.  She has a history of anxiety and was previously prescribed quetiapine  25 mg at bedtime, but she has discontinued it based on her cardiologist's advice. Her anxiety fluctuates but is less severe than before. She has Xanax  available but has not used it recently. An anxiety survey indicated minimal symptoms, with zeros on all items.  Her current medications include atorvastatin  for cholesterol, solifenacin  for bladder issues, vitamin D , and pantoprazole  for acid reflux. She occasionally uses nausea medication for severe headaches. Her reflux is generally well-controlled with pantoprazole , though she experienced symptoms after missing a dose and consuming lasagna.  She recently had an illness characterized by coughing and rhinorrhea lasting two to three weeks, but she feels much better now. She reports no current shortness of breath, and her cough is much better  than it was previously.          11/01/2023    1:33 PM 08/29/2022    9:31 AM 03/22/2022   11:43 AM  Depression screen PHQ 2/9  Decreased Interest 0 0 1  Down, Depressed, Hopeless 0 0 0  PHQ - 2 Score 0 0 1  Altered sleeping   0  Tired, decreased energy   0  Change in appetite   0  Feeling bad or failure about yourself    0  Trouble concentrating   0  Moving slowly or fidgety/restless   0  Suicidal thoughts   0  PHQ-9 Score   1  Difficult doing work/chores   Not difficult at all      11/01/2023    1:33 PM 02/21/2022    1:51 PM 01/20/2022    2:00 PM 06/10/2021    3:11 PM  GAD 7 : Generalized Anxiety Score  Nervous, Anxious, on Edge 0 0 0 0  Control/stop worrying 0 0 0 1  Worry too much - different things 0 0 0 1  Trouble relaxing 0 1 1 1   Restless 0 0 0 0  Easily annoyed or irritable 0 0 0 0  Afraid - awful might happen  0 0 0  Total GAD 7 Score  1 1 3   Anxiety Difficulty Not difficult at all  Not difficult at all Very difficult      History Kathy Ewing has a past medical history of Anxiety, B12 deficiency, Cholelithiasis, GERD (gastroesophageal reflux disease), Headache, High cholesterol, Numbness, Pneumonia, and Vitamin D  deficiency.   She has a past surgical history that includes Cesarean section and  Cholecystectomy (N/A, 10/02/2014).   Her family history includes Anxiety disorder in her brother; Cerebral palsy in her daughter; Depression in her daughter; Hypertension in her mother; Migraines in her sister; Multiple sclerosis in her sister.She reports that she has been smoking cigarettes. She has a 15 pack-year smoking history. She has never used smokeless tobacco. She reports current drug use. She reports that she does not drink alcohol.    ROS Review of Systems  Constitutional: Negative.   HENT:  Negative for congestion.   Eyes:  Negative for visual disturbance.  Respiratory:  Negative for shortness of breath.   Cardiovascular:  Negative for chest pain.   Gastrointestinal:  Negative for abdominal pain, constipation, diarrhea, nausea and vomiting.  Genitourinary:  Negative for difficulty urinating.  Musculoskeletal:  Negative for arthralgias and myalgias.  Neurological:  Negative for headaches.  Psychiatric/Behavioral:  Negative for sleep disturbance.     Objective:  BP 132/84   Pulse 81   Temp 98.5 F (36.9 C)   Ht 5' 6 (1.676 m)   Wt 178 lb 6.4 oz (80.9 kg)   SpO2 97%   BMI 28.79 kg/m   BP Readings from Last 3 Encounters:  11/01/23 132/84  04/20/23 132/63  03/13/23 137/81    Wt Readings from Last 3 Encounters:  11/01/23 178 lb 6.4 oz (80.9 kg)  04/20/23 193 lb (87.5 kg)  03/13/23 199 lb (90.3 kg)     Physical Exam Physical Exam GENERAL: Alert, cooperative, well developed, no acute distress. HEENT: Normocephalic, normal oropharynx, moist mucous membranes, right ear with cerumen impaction. CHEST: Clear to auscultation bilaterally, no wheezes, rhonchi, or crackles. CARDIOVASCULAR: Normal heart rate and rhythm, S1 and S2 normal without murmurs. ABDOMEN: Soft, non-tender, non-distended, without organomegaly, normal bowel sounds. EXTREMITIES: No cyanosis or edema. NEUROLOGICAL: Cranial nerves grossly intact, moves all extremities without gross motor or sensory deficit.   Assessment & Plan:  Anxiety -     CBC with Differential/Platelet -     CMP14+EGFR  Chronic migraine without aura without status migrainosus, not intractable -     CBC with Differential/Platelet -     CMP14+EGFR  Gastroesophageal reflux disease with esophagitis without hemorrhage -     CBC with Differential/Platelet -     CMP14+EGFR  Mixed hyperlipidemia -     CMP14+EGFR -     Lipid panel  Vitamin B 12 deficiency -     CBC with Differential/Platelet -     CMP14+EGFR -     Vitamin B12  Vitamin D  deficiency -     VITAMIN D  25 Hydroxy (Vit-D Deficiency, Fractures)  Tobacco abuse -     CBC with Differential/Platelet -      CMP14+EGFR  Impacted cerumen of right ear    Assessment and Plan Assessment & Plan  Routine checkup shows she is maintaining weight despite medication-induced appetite increase, with no new significant health issues. Perform blood work to monitor overall health.  Abnormal uterine bleeding on hormonal therapy   Abnormal uterine bleeding is managed with Megestrol , taken 21 days on and 7 days off, with bleeding occurring towards the end of the off week. Bleeding is under control. Follow up with gynecologist in November for routine checkup.  Chronic back pain   Persistent back pain continues without recent specialist consultation. She uses a TENS unit at home for pain management, providing some relief. Financial constraints limit access to frequent specialist visits.  Chronic migraine   Occasional severe headaches are managed with ondansetron  for associated nausea. No  recent changes in headache pattern.  Anxiety disorder   Anxiety symptoms are well-managed. She has not been using quetiapine  as advised by cardiologist pending further evaluation. Xanax  is available but rarely used. Anxiety survey scores indicate minimal symptoms.  Gastroesophageal reflux disease with esophagitis   GERD symptoms are well-controlled with pantoprazole . Occasional exacerbations occur when medication doses are missed, particularly after consuming trigger foods like lasagna.  Mixed hyperlipidemia   Mixed hyperlipidemia is managed with atorvastatin .  Vitamin D  deficiency   Vitamin D  deficiency is managed with weekly ergocalciferol .       Follow-up: Return in about 6 months (around 05/01/2024) for [facility] physical.  Butler Der, M.D.

## 2023-11-02 ENCOUNTER — Ambulatory Visit: Payer: Self-pay | Admitting: Family Medicine

## 2023-11-02 LAB — CMP14+EGFR
ALT: 7 IU/L (ref 0–32)
AST: 14 IU/L (ref 0–40)
Albumin: 4.4 g/dL (ref 3.8–4.9)
Alkaline Phosphatase: 118 IU/L (ref 49–135)
BUN/Creatinine Ratio: 6 — ABNORMAL LOW (ref 9–23)
BUN: 6 mg/dL (ref 6–24)
Bilirubin Total: 0.8 mg/dL (ref 0.0–1.2)
CO2: 22 mmol/L (ref 20–29)
Calcium: 8.8 mg/dL (ref 8.7–10.2)
Chloride: 106 mmol/L (ref 96–106)
Creatinine, Ser: 0.93 mg/dL (ref 0.57–1.00)
Globulin, Total: 2.1 g/dL (ref 1.5–4.5)
Glucose: 77 mg/dL (ref 70–99)
Potassium: 3.4 mmol/L — ABNORMAL LOW (ref 3.5–5.2)
Sodium: 143 mmol/L (ref 134–144)
Total Protein: 6.5 g/dL (ref 6.0–8.5)
eGFR: 74 mL/min/1.73 (ref >59)

## 2023-11-02 LAB — CBC WITH DIFFERENTIAL/PLATELET
Basophils Absolute: 0.1 x10E3/uL (ref 0.0–0.2)
Basos: 1 %
EOS (ABSOLUTE): 0.3 x10E3/uL (ref 0.0–0.4)
Eos: 4 %
Hematocrit: 44.3 % (ref 34.0–46.6)
Hemoglobin: 14.5 g/dL (ref 11.1–15.9)
Immature Grans (Abs): 0 x10E3/uL (ref 0.0–0.1)
Immature Granulocytes: 0 %
Lymphocytes Absolute: 2.1 x10E3/uL (ref 0.7–3.1)
Lymphs: 30 %
MCH: 30.1 pg (ref 26.6–33.0)
MCHC: 32.7 g/dL (ref 31.5–35.7)
MCV: 92 fL (ref 79–97)
Monocytes Absolute: 0.6 x10E3/uL (ref 0.1–0.9)
Monocytes: 9 %
Neutrophils Absolute: 3.9 x10E3/uL (ref 1.4–7.0)
Neutrophils: 55 %
Platelets: 285 x10E3/uL (ref 150–450)
RBC: 4.82 x10E6/uL (ref 3.77–5.28)
RDW: 13.3 % (ref 11.7–15.4)
WBC: 7 x10E3/uL (ref 3.4–10.8)

## 2023-11-02 LAB — LIPID PANEL
Chol/HDL Ratio: 3.1 ratio (ref 0.0–4.4)
Cholesterol, Total: 107 mg/dL (ref 100–199)
HDL: 35 mg/dL — ABNORMAL LOW (ref >39)
LDL Chol Calc (NIH): 58 mg/dL (ref 0–99)
Triglycerides: 67 mg/dL (ref 0–149)
VLDL Cholesterol Cal: 14 mg/dL (ref 5–40)

## 2023-11-02 LAB — VITAMIN B12: Vitamin B-12: 290 pg/mL (ref 232–1245)

## 2023-11-02 LAB — VITAMIN D 25 HYDROXY (VIT D DEFICIENCY, FRACTURES): Vit D, 25-Hydroxy: 25.7 ng/mL — ABNORMAL LOW (ref 30.0–100.0)

## 2023-11-02 MED ORDER — VITAMIN D (ERGOCALCIFEROL) 1.25 MG (50000 UNIT) PO CAPS: 50000.0000 [IU] | ORAL_CAPSULE | ORAL | 3 refills | Status: AC

## 2023-11-02 NOTE — Progress Notes (Signed)
 Dear Kathy Ewing, Your Vitamin D  is  low. You need a prescription strength supplement I will send that in for you. Vitamin B12 is close to the low end of normal range. Take a supplemental dose every day. I recommend B complex 100 with B12. Nurse, if at all possible, could you send in a prescription for the patient for vitamin D  50,000 units, 1 p.o. weekly #13 with 3 refills? Many thanks, WS

## 2023-11-03 ENCOUNTER — Other Ambulatory Visit (HOSPITAL_COMMUNITY): Payer: Self-pay | Admitting: Obstetrics & Gynecology

## 2023-11-03 DIAGNOSIS — Z1231 Encounter for screening mammogram for malignant neoplasm of breast: Secondary | ICD-10-CM

## 2023-11-12 ENCOUNTER — Other Ambulatory Visit: Payer: Self-pay | Admitting: Family Medicine

## 2023-11-12 DIAGNOSIS — E782 Mixed hyperlipidemia: Secondary | ICD-10-CM

## 2023-11-24 ENCOUNTER — Ambulatory Visit: Payer: Self-pay

## 2023-11-24 NOTE — Telephone Encounter (Signed)
 FYI Only or Action Required?: FYI only for provider: appointment scheduled on 11.12.25.  Patient was last seen in primary care on 11/01/2023 by Zollie Lowers, MD.  Called Nurse Triage reporting Hypertension.  Symptoms began several days ago.  Interventions attempted: Nothing.  Symptoms are: unchanged.  Triage Disposition: See PCP When Office is Open (Within 3 Days)  Patient/caregiver understands and will follow disposition?: Yes    Copied from CRM #[id number]. Topic: Clinical - Red Word Triage >> Nov 24, 2023  1:29 PM Larissa S wrote: [facility] that prompted transfer to Nurse Triage: elevated BP 147/105, heaviness in head, dizziness Reason for Disposition  Systolic BP >= 160 OR Diastolic >= 100  Answer Assessment - Initial Assessment Questions Pt states she has been checking her BP bc her face gets hot and when that happens it is higher. She states that she talked to the dr about it and nothing has been done. She states she only checks her BP when she feels the hotness. Highest bp was 158/103. She checks BP in upper arm with feet flat on the floor. She states she does get lightheaded at times and it's not always corretlated with her BP being up. She also get chest pain at times- non currently and also doesn't always correlate with higher BP. Denies any shortness of breath. RN gave instructions on how to take BP, keep a log and bring to appt, and when to go to the ER.       1. BLOOD PRESSURE: What is your blood pressure? Did you take at least two measurements 5 minutes apart?     147/101 2. ONSET: When did you take your blood pressure?     Prior to calling 3. HOW: How did you take your blood pressure? (e.g., automatic home BP monitor, visiting nurse)     automatic 4. HISTORY: Do you have a history of high blood pressure?     She's not sure she doesn't take anything but feels her Bp has been higher 5. MEDICINES: Are you taking any medicines for blood pressure? Have you  missed any doses recently?     no 6. OTHER SYMPTOMS: Do you have any symptoms? (e.g., blurred vision, chest pain, difficulty breathing, headache, weakness)     She states lightheadedness that's not always associated with this and they are working her up for it  Protocols used: Blood Pressure - High-A-AH

## 2023-11-24 NOTE — Telephone Encounter (Signed)
 Noted.  appointment scheduled on 11.12.25

## 2023-11-25 LAB — LAB REPORT - SCANNED: EGFR: 88.2

## 2023-11-28 ENCOUNTER — Encounter: Payer: Self-pay | Admitting: Obstetrics & Gynecology

## 2023-11-28 ENCOUNTER — Ambulatory Visit: Admitting: Obstetrics & Gynecology

## 2023-11-28 VITALS — BP 157/97 | HR 98 | Ht 64.0 in | Wt 174.0 lb

## 2023-11-28 DIAGNOSIS — N938 Other specified abnormal uterine and vaginal bleeding: Secondary | ICD-10-CM | POA: Diagnosis not present

## 2023-11-28 DIAGNOSIS — N3281 Overactive bladder: Secondary | ICD-10-CM

## 2023-11-28 MED ORDER — SOLIFENACIN SUCCINATE 10 MG PO TABS
10.0000 mg | ORAL_TABLET | Freq: Every day | ORAL | 11 refills | Status: AC
Start: 2023-11-28 — End: ?

## 2023-11-28 MED ORDER — MEGESTROL ACETATE 40 MG PO TABS: ORAL_TABLET | ORAL | 11 refills | Status: AC

## 2023-11-28 NOTE — Progress Notes (Signed)
 Follow up appointment  Cyclical megestrol  for management of preimenopausal DUB  Chief Complaint  Patient presents with   Follow-up    On Megestrol , discuss meds recent ER visit    Blood pressure (!) 157/97, pulse 98, height 5' 4 (1.626 m), weight 174 lb (78.9 kg).  >bleeding is getting less and less, still happening each month withdrawal No heavy 1-2 days dark red Cramping persists with the bleeding  Vesicare  is working the same, not ideal but improved  MEDS ordered this encounter: Meds ordered this encounter  Medications   megestrol  (MEGACE ) 40 MG tablet    Sig: Take 1 tablet daily x 21 days then off 7 days    Dispense:  21 tablet    Refill:  11   solifenacin  (VESICARE ) 10 MG tablet    Sig: Take 1 tablet (10 mg total) by mouth daily.    Dispense:  30 tablet    Refill:  11    Orders for this encounter: No orders of the defined types were placed in this encounter.   Impression + Management Plan   ICD-10-CM   1. Dys synchronous uterine bleeding: managed well on megestrol  cyclically 21 on 7 off  N93.8     2. OAB (overactive bladder): vesicare  10 qhs doing well  N32.81       Follow Up: Return in about 1 year (around 11/27/2024) for Follow up.     All questions were answered.  Past Medical History:  Diagnosis Date   Anxiety    B12 deficiency    Cholelithiasis    GERD (gastroesophageal reflux disease)    Headache    migraine    High cholesterol    Numbness    Pneumonia    Vitamin D  deficiency     Past Surgical History:  Procedure Laterality Date   CESAREAN SECTION     x 2   CHOLECYSTECTOMY N/A 10/02/2014   Procedure: LAPAROSCOPIC CHOLECYSTECTOMY;  Surgeon: Donnice Bury, MD;  Location: [facility] OR;  Service: General;  Laterality: N/A;    OB History     Gravida  4   Para  2   Term  2   Preterm      AB  2   Living  1      SAB  2   IAB      Ectopic      Multiple      Live Births  2           No Known Allergies  Social  History   Socioeconomic History   Marital status: Married    Spouse name: Not on file   Number of children: Not on file   Years of education: Not on file   Highest education level: Not on file  Occupational History   Not on file  Tobacco Use   Smoking status: Every Day    Current packs/day: 0.50    Average packs/day: 0.5 packs/day for 30.0 years (15.0 ttl pk-yrs)    Types: Cigarettes   Smokeless tobacco: Never  Vaping Use   Vaping status: Every Day  Substance and Sexual Activity   Alcohol use: No    Alcohol/week: 0.0 standard drinks of alcohol   Drug use: Yes    Comment: CBD   Sexual activity: Yes    Birth control/protection: None  Other Topics Concern   Not on file  Social History Narrative   She works at [employer] as a associate professor.   Highest level education:  9th grade   She lives with husband, daughter, and son.    Social Drivers of Corporate Investment Banker Strain: Not on file  Food Insecurity: Not on file  Transportation Needs: Not on file  Physical Activity: Not on file  Stress: Not on file  Social Connections: Unknown (05/31/2021)   Received from [facility]   Social Network    Social Network: Not on file    Family History  Problem Relation Age of Onset   Hypertension Mother    Anxiety disorder Brother        x 2   Migraines Sister    Multiple sclerosis Sister        Deceased, 71   Cerebral palsy Daughter        Deceased, 11.5 years   Depression Daughter

## 2023-11-29 ENCOUNTER — Ambulatory Visit: Admitting: Family Medicine

## 2023-11-29 ENCOUNTER — Encounter: Payer: Self-pay | Admitting: Family Medicine

## 2023-11-29 VITALS — BP 123/78 | HR 72 | Temp 98.0°F | Ht 64.0 in | Wt 170.0 lb

## 2023-11-29 DIAGNOSIS — R002 Palpitations: Secondary | ICD-10-CM

## 2023-11-29 DIAGNOSIS — I10 Essential (primary) hypertension: Secondary | ICD-10-CM | POA: Diagnosis not present

## 2023-11-29 MED ORDER — AMLODIPINE BESYLATE 5 MG PO TABS: 5.0000 mg | ORAL_TABLET | Freq: Every day | ORAL | 5 refills | Status: AC

## 2023-11-29 NOTE — Progress Notes (Signed)
 Subjective:  Patient ID: Kathy Ewing, female    DOB: 1970-02-17  Age: 53 y.o. MRN: [id number]  CC: Hypertension (Pt had to go to ER yesterday because right side of face was numb. BP was 167/105 when she got there. Given hydralazine 10 mg but not helping. Frequent headaches. Dull ache and heaviness all over head. Stinging in the back of her leg past two days, not sure if it is related?)   HPI  Discussed the use of AI scribe software for clinical note transcription with the patient, who gave verbal consent to proceed.  History of Present Illness Kathy Ewing is a 53 year old female who presents with elevated blood pressure and numbness on the left side of her face.  She visited the emergency room on Saturday night due to elevated blood pressure and a rapid heart rate. Her blood pressure was recorded at 160/unknown, and she reported that her heart was beating rapidly. She was administered two medications, including hydralazine, and her blood pressure subsequently measured 140/unknown.  On Tuesday, after a doctor's appointment, she experienced numbness and tingling on the left side of her face and in the back of her head. This sensation lasted about an hour and resolved after she was administered clonidine at the emergency room, which helped alleviate the symptoms.  She has been taking a medication prescribed by the emergency room three times a day since Saturday. On Saturday, she was given an unknown medication in addition to hydralazine, which did not help initially. On Tuesday, she was given clonidine, which helped reduce her blood pressure and clear the numbness.  She underwent a CT scan, X-rays, and lab tests, all of which reportedly came back normal. She also experienced blurry vision in both eyes when her blood pressure was elevated, although she notes she needs glasses.  Her heart rate can increase when she overworks herself, but it does not frequently happen when she is  at rest.          11/01/2023    1:33 PM 08/29/2022    9:31 AM 03/22/2022   11:43 AM  Depression screen PHQ 2/9  Decreased Interest 0 0 1  Down, Depressed, Hopeless 0 0 0  PHQ - 2 Score 0 0 1  Altered sleeping   0  Tired, decreased energy   0  Change in appetite   0  Feeling bad or failure about yourself    0  Trouble concentrating   0  Moving slowly or fidgety/restless   0  Suicidal thoughts   0  PHQ-9 Score   1   Difficult doing work/chores   Not difficult at all     Data saved with a previous flowsheet row definition    History Kathy Ewing has a past medical history of Anxiety, B12 deficiency, Cholelithiasis, GERD (gastroesophageal reflux disease), Headache, High cholesterol, Numbness, Pneumonia, and Vitamin D  deficiency.   She has a past surgical history that includes Cesarean section and Cholecystectomy (N/A, 10/02/2014).   Her family history includes Anxiety disorder in her brother; Cerebral palsy in her daughter; Depression in her daughter; Hypertension in her mother; Migraines in her sister; Multiple sclerosis in her sister.She reports that she has been smoking cigarettes. She has a 15 pack-year smoking history. She has never used smokeless tobacco. She reports current drug use. She reports that she does not drink alcohol.    ROS Review of Systems  Constitutional: Negative.   HENT:  Negative for congestion.   Eyes:  Negative for visual disturbance.  Respiratory:  Negative for shortness of breath.   Cardiovascular:  Negative for chest pain.  Gastrointestinal:  Negative for abdominal pain, constipation, diarrhea, nausea and vomiting.  Genitourinary:  Negative for difficulty urinating.  Musculoskeletal:  Negative for arthralgias and myalgias.  Neurological:  Negative for headaches.  Psychiatric/Behavioral:  Negative for sleep disturbance.     Objective:  BP 123/78   Pulse 72   Temp 98 F (36.7 C)   Ht 5' 4 (1.626 m)   Wt 170 lb (77.1 kg)   SpO2 96%   BMI 29.18  kg/m   BP Readings from Last 3 Encounters:  11/29/23 123/78  11/28/23 (!) 157/97  11/01/23 132/84    Wt Readings from Last 3 Encounters:  11/29/23 170 lb (77.1 kg)  11/28/23 174 lb (78.9 kg)  11/01/23 178 lb 6.4 oz (80.9 kg)     Physical Exam Physical Exam GENERAL: Alert, cooperative, well developed, no acute distress HEENT: Normocephalic, normal oropharynx, moist mucous membranes CHEST: Clear to auscultation bilaterally, No wheezes, rhonchi, or crackles CARDIOVASCULAR: Normal heart rate and rhythm, S1 and S2 normal without murmurs ABDOMEN: Soft, non-tender, non-distended, without organomegaly, Normal bowel sounds EXTREMITIES: No cyanosis or edema NEUROLOGICAL: Cranial nerves II-XII grossly intact, tongue midline without deviation, moves all extremities without gross motor or sensory deficit   Assessment & Plan:  Accelerated hypertension  Palpitations  Other orders -     amLODIPine Besylate; Take 1 tablet (5 mg total) by mouth daily. For blood pressure  Dispense: 30 tablet; Refill: 5    Assessment and Plan Assessment & Plan Hypertension   Recent episodes of elevated blood pressure were accompanied by rapid heart rate and facial numbness. Blood pressure was 161/unknown at an ER visit on Saturday and reduced to the 140s with hydralazine. Current blood pressure is well-controlled, and there are no frequent episodes of rapid heart rate at rest. CT scan and labs from the ER visit were normal. Hydralazine was used for its quick action in the ER, but amlodipine is preferred for long-term management due to its efficacy and tolerability. Continue hydralazine for one week while starting amlodipine. Taper hydralazine to twice daily for three days, then once at bedtime for three days, then stop. Monitor blood pressure and symptoms closely. Return for follow-up in three weeks or sooner if blood pressure becomes uncontrolled or if facial numbness recurs.  Transient left facial numbness  and tingling   Facial numbness and tingling occurred on the way home from a doctor's appointment and resolved after clonidine administration. A CT scan from the ER visit was normal, ruling out acute stroke. Symptoms may have been related to elevated blood pressure. Monitor for recurrence of facial numbness and tingling. If symptoms recur and do not resolve within an hour, seek emergency care.       Follow-up: Return in about 3 weeks (around 12/20/2023) for hypertension.  Butler Der, M.D.

## 2023-11-29 NOTE — Patient Instructions (Signed)
  VISIT SUMMARY: During your visit, we discussed your recent episodes of elevated blood pressure, rapid heart rate, and facial numbness. We reviewed your emergency room visits and the medications you were given, and we have adjusted your treatment plan to better manage your blood pressure.  YOUR PLAN: HYPERTENSION: You have had recent episodes of high blood pressure with a rapid heart rate and facial numbness. Your blood pressure is now well-controlled, and your tests from the ER were normal. -Continue taking hydralazine for one week while starting amlodipine for long-term management. -Taper hydralazine to twice daily for three days, then once at bedtime for three days, then stop. -Monitor your blood pressure and symptoms closely. -Return for a follow-up in three weeks or sooner if your blood pressure becomes uncontrolled or if facial numbness recurs.  TRANSIENT LEFT FACIAL NUMBNESS AND TINGLING: You experienced numbness and tingling on the left side of your face, which resolved after taking clonidine. Your CT scan was normal, ruling out a stroke. -Monitor for any recurrence of facial numbness and tingling. -If symptoms recur and do not resolve within an hour, seek emergency care.                      Contains text generated by Abridge.                                 Contains text generated by Abridge.

## 2023-12-04 ENCOUNTER — Ambulatory Visit (HOSPITAL_COMMUNITY)

## 2023-12-06 ENCOUNTER — Ambulatory Visit (HOSPITAL_COMMUNITY)

## 2023-12-13 ENCOUNTER — Ambulatory Visit (HOSPITAL_COMMUNITY)
Admission: RE | Admit: 2023-12-13 | Discharge: 2023-12-13 | Disposition: A | Discharge status: Home / Self Care | Source: Ambulatory Visit | Attending: Obstetrics & Gynecology | Admitting: Obstetrics & Gynecology

## 2023-12-13 ENCOUNTER — Encounter (HOSPITAL_COMMUNITY): Payer: Self-pay

## 2023-12-13 DIAGNOSIS — Z1231 Encounter for screening mammogram for malignant neoplasm of breast: Secondary | ICD-10-CM | POA: Diagnosis present

## 2023-12-20 ENCOUNTER — Ambulatory Visit: Admitting: Family Medicine

## 2024-01-02 ENCOUNTER — Ambulatory Visit: Admitting: Family Medicine

## 2024-01-02 ENCOUNTER — Encounter: Payer: Self-pay | Admitting: Family Medicine

## 2024-01-02 VITALS — BP 113/74 | HR 90 | Temp 97.9°F | Ht 64.0 in | Wt 176.0 lb

## 2024-01-02 DIAGNOSIS — N951 Menopausal and female climacteric states: Secondary | ICD-10-CM

## 2024-01-02 DIAGNOSIS — R42 Dizziness and giddiness: Secondary | ICD-10-CM

## 2024-01-02 DIAGNOSIS — I10 Essential (primary) hypertension: Secondary | ICD-10-CM

## 2024-01-02 NOTE — Progress Notes (Signed)
 Subjective:  Patient ID: Kathy Ewing, female    DOB: 1970-12-23  Age: 53 y.o. MRN: [id number]  CC: Medical Management of Chronic Issues (Pt reports she had one dizzy spell last week but hasn't had one since. Bp has been running around 130-115 over 90's. )   HPI  Discussed the use of AI scribe software for clinical note transcription with the patient, who gave verbal consent to proceed.  History of Present Illness Kathy Ewing Kathy Ewing is a 53 year old female who presents with dizziness and episodes of feeling faint.  She experiences episodes of dizziness and a sensation of 'floating and walking sideways' when standing up after bending over. These episodes occur intermittently, with the most recent one happening after a two-week symptom-free period. During these episodes, she feels as though she might pass out and sometimes lies down to alleviate the sensation. Her heart rate feels like it is racing during these episodes.  She has been monitoring her blood pressure and heart rate during these episodes. After a recent episode, her blood pressure was recorded at 117/98 mmHg with a heart rate of 112 bpm. Her heart rate is usually in the 80s or 90s but can increase during these episodes. She has a history of high blood pressure and is currently taking amlodipine  once daily.  She has a history of heavy menstrual bleeding and is currently on Megace , which she has been taking for a while. She has not had significant bleeding for almost three months, with only occasional spotting. She is also experiencing symptoms of menopause, including hot flashes and feeling like she is 'burning up'.  She has been trying to improve her diet by reducing soda intake and increasing water consumption. She uses Kool-Aid packets with no sodium or sugar to flavor her water and has been mindful of her sodium intake due to her blood pressure issues.  She has been evaluated by cardiology, where POTS was mentioned as a  possible cause of her symptoms. She is scheduled to follow up with cardiology in February. She has not been referred to neurology yet.          11/01/2023    1:33 PM 08/29/2022    9:31 AM 03/22/2022   11:43 AM  Depression screen PHQ 2/9  Decreased Interest 0 0 1  Down, Depressed, Hopeless 0 0 0  PHQ - 2 Score 0 0 1  Altered sleeping   0  Tired, decreased energy   0  Change in appetite   0  Feeling bad or failure about yourself    0  Trouble concentrating   0  Moving slowly or fidgety/restless   0  Suicidal thoughts   0  PHQ-9 Score   1   Difficult doing work/chores   Not difficult at all     Data saved with a previous flowsheet row definition    History Kathy Ewing has a past medical history of Anxiety, B12 deficiency, Cholelithiasis, GERD (gastroesophageal reflux disease), Headache, High cholesterol, Numbness, Pneumonia, and Vitamin D  deficiency.   She has a past surgical history that includes Cesarean section and Cholecystectomy (N/A, 10/02/2014).   Her family history includes Anxiety disorder in her brother; Cerebral palsy in her daughter; Depression in her daughter; Hypertension in her mother; Migraines in her sister; Multiple sclerosis in her sister.She reports that she has been smoking cigarettes. She has a 15 pack-year smoking history. She has never used smokeless tobacco. She reports current drug use. She reports that she does  not drink alcohol.    ROS Review of Systems  Constitutional: Negative.   HENT:  Negative for congestion.   Eyes:  Negative for visual disturbance.  Respiratory:  Negative for shortness of breath.   Cardiovascular:  Negative for chest pain.  Gastrointestinal:  Negative for abdominal pain, constipation, diarrhea, nausea and vomiting.  Genitourinary:  Negative for difficulty urinating.  Musculoskeletal:  Negative for arthralgias and myalgias.  Neurological:  Positive for dizziness and light-headedness. Negative for headaches.  Psychiatric/Behavioral:   Negative for sleep disturbance.     Objective:  BP 113/74   Pulse 90   Temp 97.9 F (36.6 C)   Ht 5' 4 (1.626 m)   Wt 176 lb (79.8 kg)   SpO2 97%   BMI 30.21 kg/m   BP Readings from Last 3 Encounters:  01/02/24 113/74  11/29/23 123/78  11/28/23 (!) 157/97    Wt Readings from Last 3 Encounters:  01/02/24 176 lb (79.8 kg)  11/29/23 170 lb (77.1 kg)  11/28/23 174 lb (78.9 kg)     Physical Exam Physical Exam GENERAL: Alert, cooperative, well developed, no acute distress HEENT: Normocephalic, normal oropharynx, moist mucous membranes CHEST: Clear to auscultation bilaterally, No wheezes, rhonchi, or crackles CARDIOVASCULAR: Normal heart rate and rhythm, S1 and S2 normal without murmurs ABDOMEN: Soft, non-tender, non-distended, without organomegaly, Normal bowel sounds EXTREMITIES: No cyanosis or edema NEUROLOGICAL: Cranial nerves grossly intact, Moves all extremities without gross motor or sensory deficit   Assessment & Plan:  Vasomotor symptoms due to menopause -     FSH/LH -     TSH  Dizziness -     TSH  Primary hypertension    Assessment and Plan Assessment & Plan Recurrent dizziness and presyncope   She experiences intermittent dizziness and presyncope, sometimes linked to orthostatic changes. Blood pressure during episodes is not significantly low, and heart rate is stable, ruling out POTS. Hormonal changes due to menopause are considered. Cardiologist mentioned POTS and another condition, but details are unclear. Neurology referral is considered for further evaluation. Ensure hydration with at least 96 ounces of water daily. Hormone level and thyroid  function tests are ordered to assess for menopausal and thyroid -related causes. Neurology referral will be considered if symptoms persist.  Vasomotor symptoms and abnormal uterine bleeding due to menopause   She experiences vasomotor symptoms like hot flashes and abnormal uterine bleeding, likely due to  menopause. Currently on Megace  for bleeding control. Hormonal therapy was discussed but not initiated due to previous heavy bleeding. A recent study suggests reconsidering hormone therapy for menopausal symptoms. Continue Megace  for bleeding control. Potential hormone therapy options will be discussed if bleeding is controlled.  Hypertension   Blood pressure management continues with amlodipine . Previous hydralazine regimen was discontinued. Blood pressure readings have been variable, with occasional elevated diastolic readings. Lifestyle modifications include reduced sodium intake and increased water consumption. Continue amlodipine  5 mg daily. Monitor blood pressure regularly. Maintain lifestyle modifications, including reduced sodium intake and adequate hydration.       Follow-up: Return in about 3 months (around 04/01/2024), or if symptoms worsen or fail to improve.  Butler Der, M.D.

## 2024-01-03 ENCOUNTER — Ambulatory Visit: Payer: Self-pay | Admitting: Family Medicine

## 2024-01-03 LAB — FSH/LH
FSH: 13 m[IU]/mL
LH: 2.4 m[IU]/mL

## 2024-01-03 LAB — TSH: TSH: 1.97 u[IU]/mL (ref 0.450–4.500)

## 2024-01-03 NOTE — Progress Notes (Signed)
Hello Lenise,  Your lab result is normal and/or stable.Some minor variations that are not significant are commonly marked abnormal, but do not represent any medical problem for you.  Best regards, Francies Inch, M.D.

## 2024-01-26 ENCOUNTER — Other Ambulatory Visit: Payer: Self-pay | Admitting: Family Medicine

## 2024-02-22 ENCOUNTER — Ambulatory Visit

## 2024-02-22 ENCOUNTER — Ambulatory Visit: Admitting: Nurse Practitioner

## 2024-02-22 ENCOUNTER — Telehealth: Payer: Self-pay | Admitting: Nurse Practitioner

## 2024-02-22 ENCOUNTER — Encounter: Payer: Self-pay | Admitting: Nurse Practitioner

## 2024-02-22 VITALS — BP 120/80 | HR 78 | Ht 66.0 in | Wt 174.2 lb

## 2024-02-22 DIAGNOSIS — R002 Palpitations: Secondary | ICD-10-CM

## 2024-02-22 NOTE — Progress Notes (Unsigned)
 " Cardiology Office Note:  .   Date:  02/22/2024  ID:  Kathy Ewing, DOB 08-05-1970, MRN [id number] PCP: Zollie Lowers, MD  [facility] Providers Cardiologist:  Maude Emmer, MD    History of Present Illness: Kathy   Kathy Ewing is a 54 y.o. female with a past medical history of chest pain, female, tobacco abuse, migraines, GERD, history of back pain, history of heavy menstrual bleeding, who presents today for scheduled follow-up.   Last seen by Dr. Leontine on May 03, 2021.  Patient noted chest pain, noted to be atypical..  Cardiac CTA was arranged that revealed coronary calcium  score of 0 with no evidence of CAD.  Saw her PCP on February 01, 2023 and reported dizziness, enough to cause near syncopal episode.  Denied any positional changes.  Prior to the episode she experienced generalized weakness and had to lie down.  Had brief period of blurred vision during the episode.  Also reported new onset right sided chest pain, felt that she pulled a muscle.  Reported feeling extremely tired since onset of the symptoms.  She had a thorough workup done by her PCP that was overall benign -see monitor report noted below.  Referral was placed to outpatient cardiology evaluation.  Monitor worn earlier this month revealed normal sinus rhythm, average heart rate 79 bpm.  Overall benign monitor.  03/09/2023 - Today she presents for follow-up regarding the symptoms.  She states she continues to note intermittent lightheadedness but not as severe as her previous episode when she had a lay down at home.  Says this is a daily occurrence, denies any orthostatic symptoms.  Admits to some intermittent chest pain at night that last for few minutes, then goes away.  Difficult for her to describe.  Admits to feeling tired and husband says that he notices she gets short of breath with exertion, patient not sure and believes this is due to believes may be due to the cold weather.  Has episodes where she says her face  feels like it is on fire.  Has been have been moments where she notices her blood pressure is elevated. Denies any palpitations, syncope, presyncope, orthopnea, PND, swelling or significant weight changes, acute bleeding, or claudication.  04/20/2023 -presents today for follow-up.  She tells me she has had 1 more recurrent episode of lightheadedness since I last saw her.  She said when she started having her symptoms, she ate some chocolate and had a Dr. Nunzio, felt weak and exhausted afterwards but symptoms did improve after eating chocolate and Dr. Nunzio.  She says her episodes have slowed down over time.  She wonders if this is possibly related to low blood sugar.  Tells me she does not eat breakfast every day.  She says that these episodes, specifically the most recent ones, have happened at nighttime around 9 PM. Denies any chest pain, shortness of breath, syncope, presyncope, dizziness, orthopnea, PND, swelling or significant weight changes, acute bleeding, or claudication.  Does admit to some palpitations, hard for her to tell.   02/22/2024 -      ROS: Negative. See HPI.   Studies Reviewed: Kathy    EKG: EKG is not ordered today.  Carotid duplex 03/2023: Summary:  Right Carotid: Velocities in the right ICA are consistent with a 1-39%  stenosis. Non-hemodynamically significant plaque <50% noted in the  CCA. The ECA appears <50% stenosed.   Left Carotid: Velocities in the left ICA are consistent with a 1-39%  stenosis. Non-hemodynamically significant plaque <50% noted in the  CCA. The ECA appears <50% stenosed.   Vertebrals:  Bilateral vertebral arteries demonstrate antegrade flow.  Subclavians: Normal flow hemodynamics were seen in bilateral subclavian arteries.  Echo 03/2023: 1. Left ventricular ejection fraction, by estimation, is 60 to 65%. The  left ventricle has normal function. The left ventricle has no regional  wall motion abnormalities. Left ventricular diastolic parameters  were  normal. The average left ventricular  global longitudinal strain is -17.8 %. The global longitudinal strain is  normal.   2. Right ventricular systolic function is normal. The right ventricular  size is normal.   3. The mitral valve is normal in structure. No evidence of mitral valve  regurgitation. No evidence of mitral stenosis.   4. The aortic valve is tricuspid. Aortic valve regurgitation is not  visualized. No aortic stenosis is present.   5. The inferior vena cava is normal in size with greater than 50%  respiratory variability, suggesting right atrial pressure of 3 mmHg.   Comparison(s): A prior study was performed on 11/28/2013. EF 60-65%.  Cardiac monitor 02/2023:  Tachy-brady present. Avoid stimulants. Follow up with cardiology.    Patch Wear Time:  13 days and 19 hours (2025-01-15T15:19:53-0500 to 2025-01-29T10:49:55-0500)   Patient had a min HR of 46 bpm, max HR of 170 bpm, and avg HR of 79 bpm. Predominant underlying rhythm was Sinus Rhythm. 1 run of Supraventricular Tachycardia occurred lasting 4 beats with a max rate of 152 bpm (avg 140 bpm). Isolated SVEs were rare  (<1.0%), SVE Couplets were rare (<1.0%), and SVE Triplets were rare (<1.0%). Isolated VEs were rare (<1.0%), and no VE Couplets or VE Triplets were present.   CCTA 04/2021:  IMPRESSION: 1. Coronary calcium  score of 0. This was 0 percentile for age-, sex, and race-matched controls.   2. Normal coronary origin with right dominance.   3. No evidence of CAD.   RECOMMENDATIONS: CAD-RADS 0: No evidence of CAD (0%). Consider non-atherosclerotic causes of chest pain.   Redell Shallow, MD  Echo 11/2013:  Study Conclusions   - Left ventricle: The cavity size was normal. Wall thickness was    normal. Systolic function was normal. The estimated ejection    fraction was in the range of 60% to 65%. Wall motion was normal;    there were no regional wall motion abnormalities. Indeterminant    diastolic  function.  - Aortic valve: Valve area (VTI): 2.29 cm^2. Valve area (Vmax):    2.29 cm^2. Valve area (Vmean): 2.3 cm^2.  - Technically adequate study.  Physical Exam:   VS:  There were no vitals taken for this visit.   Wt Readings from Last 3 Encounters:  01/02/24 176 lb (79.8 kg)  11/29/23 170 lb (77.1 kg)  11/28/23 174 lb (78.9 kg)    GEN: Well nourished, well developed in no acute distress NECK: No JVD; No carotid bruits CARDIAC: S1/S2, RRR, no murmurs, rubs, gallops RESPIRATORY:  Clear to auscultation without rales, wheezing or rhonchi  ABDOMEN: Soft, non-tender, non-distended EXTREMITIES:  No edema; No deformity   ASSESSMENT AND PLAN: .    Lightheadedness, palpitations, near syncope, fatigue Etiology unclear. Recent monitor revealed overall well controlled HR, some elevated HR at times, difficult to tell if she has some sort of autonomic dysfunction, one episode of bradycardia that were not triggered by patient. See cardiac monitor report above.  Does admit to some palpitations at times.  Did discuss starting medication, however patient expressed some  hesitancy.  Hesitant to start medication at this time related to her symptoms.. Past Orthostatics negative.  Recent echo and carotid duplex do not explain her symptoms, past CCTA was negative for CAD.  Possibly felt to be due to hormonal component/possible low blood sugar.  She will follow-up with her PCP and OB/GYN for further evaluation.  Recommended to monitor and log her heart rate/blood pressure at home and bring this in to review at next office visit.  Discussed conservative measures and care and ED precautions discussed.   HTN Continues to note elevated BPs during times of episodes.  BP on arrival 140/98, repeat BP 132/63.  Did discuss that SBP goal < 130 and hesitant at this time to start BP medication with her current intermittent symptoms. Discussed to monitor BP at home at least 2 hours after medications and sitting for 5-10  minutes.  No medication changes at this time. Continue to follow with PCP.    Dispo: Follow-up with me/APP in 2-3 months or sooner if anything changes.   Signed, Almarie Crate, NP   "

## 2024-02-22 NOTE — Patient Instructions (Signed)
 Medication Instructions:  Your physician recommends that you continue on your current medications as directed. Please refer to the Current Medication list given to you today.  *If you need a refill on your cardiac medications before your next appointment, please call your pharmacy*  Lab Work: NONE   If you have labs (blood work) drawn today and your tests are completely normal, you will receive your results only by: MyChart Message (if you have MyChart) OR A paper copy in the mail If you have any lab test that is abnormal or we need to change your treatment, we will call you to review the results.  Testing/Procedures: ZIO XT- Long Term Monitor Instructions   Your physician has requested you wear your ZIO patch monitor___7____days.  This is a single patch monitor.  [employer] supplies one patch monitor per enrollment.  Additional stickers are not available.   Please do not apply patch if you will be having a Nuclear Stress Test, Echocardiogram, Cardiac CT, MRI, or Chest Xray during the time frame you would be wearing the monitor. The patch cannot be worn during these tests.  You cannot remove and re-apply the ZIO XT patch monitor.   Your ZIO patch monitor will be sent USPS Priority mail from [facility] directly to your home address. The monitor may also be mailed to a PO BOX if home delivery is not available.   It may take 3-5 days to receive your monitor after you have been enrolled.   Once you have received you monitor, please review enclosed instructions.  Your monitor has already been registered assigning a specific monitor serial # to you.   Applying the monitor   Shave hair from upper left chest.   Hold abrader disc by orange tab.  Rub abrader in 40 strokes over left upper chest as indicated in your monitor instructions.   Clean area with 4 enclosed alcohol pads .  Use all pads to assure are is cleaned thoroughly.  Let dry.   Apply patch as indicated in monitor  instructions.  Patch will be place under collarbone on left side of chest with arrow pointing upward.   Rub patch adhesive wings for 2 minutes.Remove white label marked 1.  Remove white label marked 2.  Rub patch adhesive wings for 2 additional minutes.   While looking in a mirror, press and release button in center of patch.  A small green light will flash 3-4 times .  This will be your only indicator the monitor has been turned on.     Do not shower for the first 24 hours.  You may shower after the first 24 hours.   Press button if you feel a symptom. You will hear a small click.  Record Date, Time and Symptom in the Patient Log Book.   When you are ready to remove patch, follow instructions on last 2 pages of Patient Log Book.  Stick patch monitor onto last page of Patient Log Book.   Place Patient Log Book in [place] box.  Use locking tab on box and tape box closed securely.  The [employer] has [employer] on it.  Please place in mailbox as soon as possible.  Your physician should have your test results approximately 7 days after the monitor has been mailed back to [facility].   Call [facility] Customer Care at [phone] if you have questions regarding your ZIO XT patch monitor.  Call them immediately if you see an orange light blinking on  your monitor.   If your monitor falls off in less than 4 days contact our Monitor department at [phone].  If your monitor becomes loose or falls off after 4 days call [employer] at [phone] for suggestions on securing your monitor.    Follow-Up: At [facility], you and your health needs are our priority.  As part of our continuing mission to provide you with exceptional heart care, our providers are all part of one team.  This team includes your primary Cardiologist (physician) and Advanced Practice Providers or APPs (Physician Assistants and Nurse Practitioners) who all work together to provide you with the care  you need, when you need it.  Your next appointment:   3 -4 month(s)  Provider:   Almarie Crate, NP    We recommend signing up for the patient portal called MyChart.  Sign up information is provided on this After Visit Summary.  MyChart is used to connect with patients for Virtual Visits (Telemedicine).  Patients are able to view lab/test results, encounter notes, upcoming appointments, etc.  Non-urgent messages can be sent to your provider as well.   To learn more about what you can do with MyChart, go to [email or web address].   Other Instructions Thank you for choosing [facility]!           GERD in Adults: Diet Changes When you have gastroesophageal reflux disease (GERD), you may need to make changes to your diet. Choosing the right foods can help with your symptoms. Think about working with an expert in healthy eating called a dietitian. They can help you make healthy food choices. What are tips for following this plan? Reading food labels Look for foods that are low in saturated fat. Foods that may help with your symptoms include: Foods with less than 5% of daily value (DV) of fat. Foods with 0 grams of trans fat. Cooking [employer] in ways that don't use a lot of fat. These ways include: Baking. Steaming. Grilling. Broiling. To add flavor, try to use herbs that are low in spice and acidity. Avoid frying your food. Meal planning  Eat small meals often rather than eating 3 large meals each day. Eat your meals slowly in a place where you feel relaxed. If told by your health care provider, avoid: Foods that cause symptoms. Keep a food diary to keep track of foods that cause symptoms. Alcohol. Drinking a lot of liquid with meals. General instructions For 2-3 hours after you eat, avoid: Bending over. Exercise. Lying down. Chew sugar-free gum after meals. What foods should I eat? Eat a healthy diet. Try to include: Foods with high amounts of fiber.  These include: Fruits and vegetables. Whole grains and beans. Low-fat dairy products. Lean meats, fish, and poultry. Egg whites. Foods that cause symptoms in someone else may not cause symptoms for you. Work with your provider to find foods that are safe for you. The items listed above may not be all the foods and drinks you can have. Talk with a dietitian to learn more. The items listed above may not be a complete list of foods and beverages you can eat and drink. Contact a dietitian for more information. What foods should I avoid? Limiting some of these foods may help with your symptoms. Each person is different. Talk with a dietitian or your provider to help you find the exact foods to avoid. Some of the foods to avoid may include: Fruits Fruits with a lot of acid in them.  These may include citrus fruits, such as oranges, grapefruit, pineapple, and lemons. Vegetables Deep-fried vegetables, such as French fries. Vegetables, sauces, or toppings made with added fat and vegetables with acid in them. These may include tomatoes and tomato products, chili peppers, onions, garlic, and horseradish. Grains Pastries or quick breads with added fat. Meats and other proteins High-fat meats, such as fatty beef or pork, hot dogs, ribs, ham, sausage, salami, and bacon. Fried meat or protein, such as fried fish and fried chicken. Egg yolks. Fats and oils Butter. Margarine. Shortening. Ghee. Drinks Coffee and other drinks with caffeine  in them. Fizzy and sugary drinks, such as soda and energy drinks. Fruit juice made with acidic fruits, such as orange or grapefruit. Tomato juice. Sweets and desserts Chocolate and cocoa. Donuts. Seasonings and condiments Mint, such as peppermint and spearmint. Condiments, herbs, or seasonings that cause symptoms. These may include curry, hot sauce, or vinegar-based salad dressings. The items listed above may not be all the foods and drinks you should avoid. Talk with a  dietitian to learn more. Questions to ask your health care provider Changes to your diet and everyday life are often the first steps taken to manage symptoms of GERD. If these changes don't help, talk with your provider about taking medicines. Where to find more information International [employer] for Gastrointestinal Disorders: aboutgerd.org This information is not intended to replace advice given to you by your health care provider. Make sure you discuss any questions you have with your health care provider. Document Revised: 11/15/2022 Document Reviewed: 06/01/2022 Elsevier Patient Education  2024 [employer].

## 2024-02-22 NOTE — Telephone Encounter (Signed)
 Checking percert on the following   LONG TERM MONITOR (3-14 DAYS)

## 2024-05-01 ENCOUNTER — Encounter: Payer: Self-pay | Admitting: Family Medicine

## 2024-05-16 ENCOUNTER — Ambulatory Visit: Admitting: Nurse Practitioner
